# Patient Record
Sex: Female | Born: 1975
Health system: Southern US, Community
[De-identification: ages and names within clinical notes are randomized; demographics above are authoritative.]

## PROBLEM LIST (undated history)

## (undated) DIAGNOSIS — R7303 Prediabetes: Secondary | ICD-10-CM

## (undated) DIAGNOSIS — G4730 Sleep apnea, unspecified: Secondary | ICD-10-CM

## (undated) DIAGNOSIS — F22 Delusional disorders: Secondary | ICD-10-CM

## (undated) DIAGNOSIS — E282 Polycystic ovarian syndrome: Secondary | ICD-10-CM

## (undated) DIAGNOSIS — E785 Hyperlipidemia, unspecified: Secondary | ICD-10-CM

## (undated) DIAGNOSIS — I10 Essential (primary) hypertension: Secondary | ICD-10-CM

## (undated) DIAGNOSIS — N3941 Urge incontinence: Secondary | ICD-10-CM

## (undated) DIAGNOSIS — J45909 Unspecified asthma, uncomplicated: Secondary | ICD-10-CM

## (undated) DIAGNOSIS — F32A Depression, unspecified: Secondary | ICD-10-CM

## (undated) DIAGNOSIS — F329 Major depressive disorder, single episode, unspecified: Secondary | ICD-10-CM

## (undated) DIAGNOSIS — F410 Panic disorder [episodic paroxysmal anxiety] without agoraphobia: Secondary | ICD-10-CM

## (undated) DIAGNOSIS — K219 Gastro-esophageal reflux disease without esophagitis: Secondary | ICD-10-CM

## (undated) DIAGNOSIS — E669 Obesity, unspecified: Secondary | ICD-10-CM

## (undated) DIAGNOSIS — T7840XA Allergy, unspecified, initial encounter: Secondary | ICD-10-CM

## (undated) DIAGNOSIS — D649 Anemia, unspecified: Secondary | ICD-10-CM

## (undated) DIAGNOSIS — M1990 Unspecified osteoarthritis, unspecified site: Secondary | ICD-10-CM

## (undated) DIAGNOSIS — E119 Type 2 diabetes mellitus without complications: Secondary | ICD-10-CM

## (undated) HISTORY — DX: Gastro-esophageal reflux disease without esophagitis: K21.9

## (undated) HISTORY — PX: HYSTEROSCOPY DIAGNOSTIC: PRO49

## (undated) HISTORY — DX: Major depressive disorder, single episode, unspecified: F32.9

## (undated) HISTORY — DX: Panic disorder (episodic paroxysmal anxiety): F41.0

## (undated) HISTORY — DX: Unspecified osteoarthritis, unspecified site: M19.90

## (undated) HISTORY — DX: Sleep apnea, unspecified: G47.30

## (undated) HISTORY — DX: Allergy, unspecified, initial encounter: T78.40XA

## (undated) HISTORY — DX: Polycystic ovarian syndrome: E28.2

## (undated) HISTORY — DX: Anemia, unspecified: D64.9

## (undated) HISTORY — DX: Type 2 diabetes mellitus without complications: E11.9

## (undated) HISTORY — DX: Unspecified asthma, uncomplicated: J45.909

## (undated) HISTORY — DX: Obesity, unspecified: E66.9

## (undated) HISTORY — DX: Depression, unspecified: F32.A

## (undated) HISTORY — DX: Delusional disorders: F22

## (undated) HISTORY — PX: OTHER SURGICAL HISTORY: SHX169

## (undated) HISTORY — DX: Urge incontinence: N39.41

## (undated) HISTORY — DX: Prediabetes: R73.03

---

## 1998-12-17 LAB — PULMONARY FUNCTION TEST

## 2001-11-04 ENCOUNTER — Encounter: Payer: Self-pay | Admitting: *Deleted

## 2001-11-04 ENCOUNTER — Emergency Department (HOSPITAL_COMMUNITY): Admission: EM | Admit: 2001-11-04 | Discharge: 2001-11-04 | Payer: Self-pay | Admitting: *Deleted

## 2002-04-11 ENCOUNTER — Emergency Department (HOSPITAL_COMMUNITY): Admission: RE | Admit: 2002-04-11 | Discharge: 2002-04-11 | Payer: Self-pay | Admitting: *Deleted

## 2003-08-27 ENCOUNTER — Ambulatory Visit (HOSPITAL_COMMUNITY): Admission: RE | Admit: 2003-08-27 | Discharge: 2003-08-27 | Payer: Self-pay | Admitting: Family Medicine

## 2004-08-02 ENCOUNTER — Ambulatory Visit: Admission: RE | Admit: 2004-08-02 | Discharge: 2004-08-02 | Payer: Self-pay | Admitting: Family Medicine

## 2004-08-02 ENCOUNTER — Ambulatory Visit: Payer: Self-pay | Admitting: Pulmonary Disease

## 2008-03-17 ENCOUNTER — Emergency Department (HOSPITAL_COMMUNITY): Admission: EM | Admit: 2008-03-17 | Discharge: 2008-03-17 | Payer: Self-pay | Admitting: Emergency Medicine

## 2008-08-15 ENCOUNTER — Other Ambulatory Visit: Admission: RE | Admit: 2008-08-15 | Discharge: 2008-08-15 | Payer: Self-pay | Admitting: Obstetrics and Gynecology

## 2008-10-29 ENCOUNTER — Ambulatory Visit (HOSPITAL_COMMUNITY): Payer: Self-pay | Admitting: Psychiatry

## 2009-10-09 ENCOUNTER — Emergency Department (HOSPITAL_COMMUNITY): Admission: EM | Admit: 2009-10-09 | Discharge: 2009-10-09 | Payer: Self-pay | Admitting: Emergency Medicine

## 2010-03-31 ENCOUNTER — Other Ambulatory Visit: Admission: RE | Admit: 2010-03-31 | Discharge: 2010-03-31 | Payer: Self-pay | Admitting: Obstetrics and Gynecology

## 2010-07-24 ENCOUNTER — Emergency Department (HOSPITAL_COMMUNITY): Admission: EM | Admit: 2010-07-24 | Discharge: 2010-07-24 | Payer: Self-pay | Admitting: Emergency Medicine

## 2010-12-01 LAB — POCT CARDIAC MARKERS
CKMB, poc: <1 ng/mL — ABNORMAL LOW (ref 1.0–8.0)
Myoglobin, poc: 57 ng/mL (ref 12–200)
Troponin i, poc: <0.05 ng/mL (ref 0.00–0.09)

## 2011-01-30 NOTE — Procedures (Signed)
NAMELuara Castro, Jasmin Castro                ACCOUNT NO.:  [id number]   MEDICAL RECORD NO.:  [id number]          PATIENT TYPE:  OUT   LOCATION:  SLEEP LAB                     FACILITY:  [facility]   PHYSICIAN:  Marcelyn Bruins, M.D. [facility] DATE OF BIRTH:  Mar 01, 1976   DATE OF STUDY:  08/02/2004                              NOCTURNAL POLYSOMNOGRAM   REFERRING PHYSICIAN:  Lubertha South, MD   INDICATION FOR STUDY:  Hypersomnia with sleep apnea.   SLEEP ARCHITECTURE:  The patient had a total sleep time of 342 minutes with  a sleep efficiency of only 82%. There was adequate slow wave sleep but  greatly decreased REM. Sleep onset latency was prolonged at 53 minutes and  REM latency was prolonged as well.   IMPRESSION:  1.  Mild obstructive sleep apnea/hypopnea syndrome with a respiratory      disturbance index of 15 events per hour and O2 desaturations as low as      84%.  The patient did not meet split night protocol secondary to      inadequate numbers of obstructive events in the first 3 hours of sleep.      Events were not positional nor were they primarily REM related.  2.  Moderate-to-loud snoring noted throughout the study.  3.  No clinically significant cardiac arrhythmias.      KC/MEDQ  D:  08/26/2004 15:55:12  T:  08/26/2004 22:33:13  Job:  [id number]

## 2011-06-25 ENCOUNTER — Other Ambulatory Visit (HOSPITAL_COMMUNITY): Payer: Self-pay | Admitting: Urology

## 2011-06-25 DIAGNOSIS — R109 Unspecified abdominal pain: Secondary | ICD-10-CM

## 2011-06-25 DIAGNOSIS — N390 Urinary tract infection, site not specified: Secondary | ICD-10-CM

## 2011-06-29 ENCOUNTER — Ambulatory Visit (HOSPITAL_COMMUNITY)
Admission: RE | Admit: 2011-06-29 | Discharge: 2011-06-29 | Disposition: A | Discharge status: Home / Self Care | Payer: Medicare Other | Source: Ambulatory Visit | Attending: Urology | Admitting: Urology

## 2011-06-29 DIAGNOSIS — R109 Unspecified abdominal pain: Secondary | ICD-10-CM

## 2011-06-29 DIAGNOSIS — N390 Urinary tract infection, site not specified: Secondary | ICD-10-CM | POA: Insufficient documentation

## 2011-06-29 DIAGNOSIS — R1032 Left lower quadrant pain: Secondary | ICD-10-CM | POA: Insufficient documentation

## 2011-08-13 ENCOUNTER — Other Ambulatory Visit: Payer: Self-pay | Admitting: Family Medicine

## 2011-08-13 ENCOUNTER — Ambulatory Visit (HOSPITAL_COMMUNITY)
Admission: RE | Admit: 2011-08-13 | Discharge: 2011-08-13 | Disposition: A | Discharge status: Home / Self Care | Payer: Medicare Other | Source: Ambulatory Visit | Attending: Family Medicine | Admitting: Family Medicine

## 2011-08-13 DIAGNOSIS — M25569 Pain in unspecified knee: Secondary | ICD-10-CM | POA: Insufficient documentation

## 2011-08-13 DIAGNOSIS — M25562 Pain in left knee: Secondary | ICD-10-CM

## 2011-10-26 DIAGNOSIS — N390 Urinary tract infection, site not specified: Secondary | ICD-10-CM | POA: Diagnosis not present

## 2011-12-02 DIAGNOSIS — M545 Low back pain: Secondary | ICD-10-CM | POA: Diagnosis not present

## 2011-12-26 ENCOUNTER — Inpatient Hospital Stay (HOSPITAL_COMMUNITY): Admission: RE | Admit: 2011-12-26 | Payer: Self-pay

## 2011-12-26 ENCOUNTER — Ambulatory Visit (HOSPITAL_COMMUNITY)
Admission: RE | Admit: 2011-12-26 | Discharge: 2011-12-26 | Disposition: A | Discharge status: Home / Self Care | Payer: Medicare Other | Source: Ambulatory Visit | Attending: Family Medicine | Admitting: Family Medicine

## 2011-12-26 ENCOUNTER — Other Ambulatory Visit: Payer: Self-pay | Admitting: Family Medicine

## 2011-12-26 DIAGNOSIS — M545 Low back pain, unspecified: Secondary | ICD-10-CM | POA: Diagnosis not present

## 2011-12-26 DIAGNOSIS — M47817 Spondylosis without myelopathy or radiculopathy, lumbosacral region: Secondary | ICD-10-CM | POA: Diagnosis not present

## 2011-12-26 DIAGNOSIS — R079 Chest pain, unspecified: Secondary | ICD-10-CM | POA: Insufficient documentation

## 2011-12-26 DIAGNOSIS — R300 Dysuria: Secondary | ICD-10-CM | POA: Diagnosis not present

## 2011-12-26 DIAGNOSIS — I10 Essential (primary) hypertension: Secondary | ICD-10-CM | POA: Diagnosis not present

## 2011-12-26 DIAGNOSIS — N390 Urinary tract infection, site not specified: Secondary | ICD-10-CM | POA: Diagnosis not present

## 2011-12-26 DIAGNOSIS — R1084 Generalized abdominal pain: Secondary | ICD-10-CM | POA: Diagnosis not present

## 2012-01-13 ENCOUNTER — Emergency Department (HOSPITAL_COMMUNITY)
Admission: EM | Admit: 2012-01-13 | Discharge: 2012-01-13 | Disposition: A | Discharge status: Home / Self Care | Payer: Medicare Other | Attending: Emergency Medicine | Admitting: Emergency Medicine

## 2012-01-13 ENCOUNTER — Encounter (HOSPITAL_COMMUNITY): Payer: Self-pay | Admitting: *Deleted

## 2012-01-13 DIAGNOSIS — S335XXA Sprain of ligaments of lumbar spine, initial encounter: Secondary | ICD-10-CM | POA: Insufficient documentation

## 2012-01-13 DIAGNOSIS — M549 Dorsalgia, unspecified: Secondary | ICD-10-CM | POA: Insufficient documentation

## 2012-01-13 DIAGNOSIS — E785 Hyperlipidemia, unspecified: Secondary | ICD-10-CM | POA: Diagnosis not present

## 2012-01-13 DIAGNOSIS — I10 Essential (primary) hypertension: Secondary | ICD-10-CM | POA: Insufficient documentation

## 2012-01-13 DIAGNOSIS — X58XXXA Exposure to other specified factors, initial encounter: Secondary | ICD-10-CM | POA: Insufficient documentation

## 2012-01-13 DIAGNOSIS — S39012A Strain of muscle, fascia and tendon of lower back, initial encounter: Secondary | ICD-10-CM

## 2012-01-13 HISTORY — DX: Hyperlipidemia, unspecified: E78.5

## 2012-01-13 HISTORY — DX: Essential (primary) hypertension: I10

## 2012-01-13 LAB — URINALYSIS, ROUTINE W REFLEX MICROSCOPIC
Hgb urine dipstick: NEGATIVE
Leukocytes, UA: NEGATIVE
Nitrite: NEGATIVE
Protein, ur: NEGATIVE mg/dL
Specific Gravity, Urine: <1.005 — ABNORMAL LOW (ref 1.005–1.030)
Urobilinogen, UA: 0.2 mg/dL (ref 0.0–1.0)

## 2012-01-13 LAB — PREGNANCY, URINE: Preg Test, Ur: NEGATIVE

## 2012-01-13 MED ORDER — HYDROCODONE-ACETAMINOPHEN 5-325 MG PO TABS: 1.0000 | ORAL_TABLET | ORAL | Status: AC | PRN

## 2012-01-13 NOTE — Discharge Instructions (Signed)
Back Pain, Adult Low back pain is very common. About 1 in 5 people have back pain.The cause of low back pain is rarely dangerous. The pain often gets better over time.About half of people with a sudden onset of back pain feel better in just 2 weeks. About 8 in 10 people feel better by 6 weeks.  CAUSES Some common causes of back pain include:  Strain of the muscles or ligaments supporting the spine.   Wear and tear (degeneration) of the spinal discs.   Arthritis.   Direct injury to the back.  DIAGNOSIS Most of the time, the direct cause of low back pain is not known.However, back pain can be treated effectively even when the exact cause of the pain is unknown.Answering your caregiver's questions about your overall health and symptoms is one of the most accurate ways to make sure the cause of your pain is not dangerous. If your caregiver needs more information, he or she may order lab work or imaging tests (X-rays or MRIs).However, even if imaging tests show changes in your back, this usually does not require surgery. HOME CARE INSTRUCTIONS For many people, back pain returns.Since low back pain is rarely dangerous, it is often a condition that people can learn to manageon their own.   Remain active. It is stressful on the back to sit or stand in one place. Do not sit, drive, or stand in one place for more than 30 minutes at a time. Take short walks on level surfaces as soon as pain allows.Try to increase the length of time you walk each day.   Do not stay in bed.Resting more than 1 or 2 days can delay your recovery.   Do not avoid exercise or work.Your body is made to move.It is not dangerous to be active, even though your back may hurt.Your back will likely heal faster if you return to being active before your pain is gone.   Pay attention to your body when you bend and lift. Many people have less discomfortwhen lifting if they bend their knees, keep the load close to their  bodies,and avoid twisting. Often, the most comfortable positions are those that put less stress on your recovering back.   Find a comfortable position to sleep. Use a firm mattress and lie on your side with your knees slightly bent. If you lie on your back, put a pillow under your knees.   Only take over-the-counter or prescription medicines as directed by your caregiver. Over-the-counter medicines to reduce pain and inflammation are often the most helpful.Your caregiver may prescribe muscle relaxant drugs.These medicines help dull your pain so you can more quickly return to your normal activities and healthy exercise.   Put ice on the injured area.   Put ice in a plastic bag.   Place a towel between your skin and the bag.   Leave the ice on for 15 to 20 minutes, 3 to 4 times a day for the first 2 to 3 days. After that, ice and heat may be alternated to reduce pain and spasms.   Ask your caregiver about trying back exercises and gentle massage. This may be of some benefit.   Avoid feeling anxious or stressed.Stress increases muscle tension and can worsen back pain.It is important to recognize when you are anxious or stressed and learn ways to manage it.Exercise is a great option.  SEEK MEDICAL CARE IF:  You have pain that is not relieved with rest or medicine.   You have   pain that does not improve in 1 week.   You have new symptoms.   You are generally not feeling well.  SEEK IMMEDIATE MEDICAL CARE IF:   You have pain that radiates from your back into your legs.   You develop new bowel or bladder control problems.   You have unusual weakness or numbness in your arms or legs.   You develop nausea or vomiting.   You develop abdominal pain.   You feel faint.  Document Released: 08/31/2005 Document Revised: 08/20/2011 Document Reviewed: 01/19/2011 [facility] Patient Information 2012 [place], [place].   Do not drive within 4 hours of taking hydrocodone as this will make you  drowsy.  Avoid lifting,  Bending,  Twisting or any other activity that worsens your pain over the next week.  Apply an  icepack  to your lower back for 10-15 minutes every 2 hours for the next 2 days. Followup with your doctor tomorrow as scheduled.

## 2012-01-13 NOTE — ED Notes (Signed)
Low back pain for 2 mos, that comes around to bil flank.  Seen by MD for same 2 mos ago and had x-rays done. And tx for UTI.

## 2012-01-14 DIAGNOSIS — M545 Low back pain: Secondary | ICD-10-CM | POA: Diagnosis not present

## 2012-01-14 DIAGNOSIS — I10 Essential (primary) hypertension: Secondary | ICD-10-CM | POA: Diagnosis not present

## 2012-01-14 NOTE — ED Provider Notes (Signed)
History     CSN: [id number]  Arrival date & time 01/13/12  1157   First MD Initiated Contact with Patient 01/13/12 1338      Chief Complaint  Patient presents with  . Back Pain    (Consider location/radiation/quality/duration/timing/severity/associated sxs/prior treatment) HPI Comments: Jasmin Castro presents with a 2 month history of intermittent low back pain without injury.  She was seen by her pcp for this 2 months ago at which time she reports xrays of her lower back were normal,  But she had a uti which she was treated for.  She has worse pain today,  So came in to make sure she did not have another uti.  She denies urinary frequency,  Dysuria,  Hematuria or vaginal discharge.  She has low back pain which is achy,  Constant,  But worse with changes in position and walking.  She is scheduled to see her doctor tomorrow,  But "just couldn't wait".  Patient is a 36 y.o. female presenting with back pain. The history is provided by the patient.  Back Pain  The pain is associated with no known injury. The pain is at a severity of 5/10. The pain is moderate. The symptoms are aggravated by bending, twisting and certain positions. Pertinent negatives include no chest pain, no fever, no numbness, no abdominal pain, no bowel incontinence, no perianal numbness, no bladder incontinence, no dysuria, no paresthesias, no paresis and no weakness. She has tried nothing for the symptoms.    Past Medical History  Diagnosis Date  . Hyperlipemia   . Hypertension     Past Surgical History  Procedure Date  . Insulin resistant     History reviewed. No pertinent family history.  History  Substance Use Topics  . Smoking status: Never Smoker   . Smokeless tobacco: Not on file  . Alcohol Use: No    OB History    Grav Para Term Preterm Abortions TAB SAB Ect Mult Living                  Review of Systems  Constitutional: Negative for fever.  Respiratory: Negative for shortness of breath.     Cardiovascular: Negative for chest pain and leg swelling.  Gastrointestinal: Negative for abdominal pain, constipation, abdominal distention and bowel incontinence.  Genitourinary: Negative for bladder incontinence, dysuria, urgency, frequency, flank pain and difficulty urinating.  Musculoskeletal: Positive for back pain. Negative for joint swelling and gait problem.  Skin: Negative for rash.  Neurological: Negative for weakness, numbness and paresthesias.    Allergies  Lodine  Home Medications   Current Outpatient Rx  Name Route Sig Dispense Refill  . HYDROCODONE-ACETAMINOPHEN 5-325 MG PO TABS Oral Take 1 tablet by mouth every 4 (four) hours as needed for pain. 20 tablet 0    BP 145/85  Pulse 87  Temp(Src) 97.9 F (36.6 C) (Oral)  Resp 20  Ht 5\' 5"  (1.651 m)  Wt 364 lb (165.109 kg)  BMI 60.57 kg/m2  SpO2 99%  LMP 01/12/2012  Physical Exam  Nursing note and vitals reviewed. Constitutional: She appears well-developed and well-nourished.  HENT:  Head: Normocephalic.  Eyes: Conjunctivae are normal.  Neck: Normal range of motion. Neck supple.  Cardiovascular: Normal rate and intact distal pulses.        Pedal pulses normal.  Pulmonary/Chest: Effort normal.  Abdominal: Soft. Bowel sounds are normal. She exhibits no distension and no mass.  Musculoskeletal: Normal range of motion. She exhibits no edema.  Lumbar back: She exhibits tenderness. She exhibits no swelling, no edema and no spasm.  Neurological: She is alert. She has normal strength. She displays no atrophy and no tremor. No sensory deficit. Gait normal.  Reflex Scores:      Patellar reflexes are 2+ on the right side and 2+ on the left side.      Achilles reflexes are 2+ on the right side and 2+ on the left side.      No strength deficit noted in hip and knee flexor and extensor muscle groups.  Ankle flexion and extension intact.  Skin: Skin is warm and dry.  Psychiatric: She has a normal mood and affect.     ED Course  Procedures (including critical care time)  Labs Reviewed  URINALYSIS, ROUTINE W REFLEX MICROSCOPIC - Abnormal; Notable for the following:    Specific Gravity, Urine <1.005 (*)    All other components within normal limits  PREGNANCY, URINE  URINE CULTURE   No results found.   1. Lumbar strain       MDM  Urine culture sent as pt states her previous uti was only seen on culture.  Sent for pt reassurance.  Hydrocodone,  Low back pain instructions.  Pt to see pcp tomorrow.  No neuro deficit on exam or by history to suggest emergent or surgical presentation.     Burgess Amor, [place] 01/14/12 [phone]

## 2012-01-14 NOTE — ED Provider Notes (Signed)
Medical screening examination/treatment/procedure(s) were performed by non-physician practitioner and as supervising physician I was immediately available for consultation/collaboration.   Carleene Cooper III, MD 01/14/12 2046

## 2012-01-15 LAB — URINE CULTURE: Colony Count: >=100000

## 2012-01-27 ENCOUNTER — Ambulatory Visit (HOSPITAL_COMMUNITY)
Admission: RE | Admit: 2012-01-27 | Discharge: 2012-01-27 | Disposition: A | Discharge status: Home / Self Care | Payer: Medicare Other | Source: Ambulatory Visit | Attending: Family Medicine | Admitting: Family Medicine

## 2012-01-27 DIAGNOSIS — IMO0001 Reserved for inherently not codable concepts without codable children: Secondary | ICD-10-CM | POA: Insufficient documentation

## 2012-01-27 DIAGNOSIS — M545 Low back pain, unspecified: Secondary | ICD-10-CM | POA: Insufficient documentation

## 2012-01-27 NOTE — Evaluation (Signed)
Physical Therapy Evaluation  Patient Details  Name: Jasmin Castro MRN: [id number] Date of Birth: Jan 23, 1976  Today's Date: 01/27/2012 Time: 4098-1191 PT Time Calculation (min): 44 min  Visit#: 1  of 6   Re-eval: 02/17/12 Assessment Diagnosis: Lumbar strain Next MD Visit: 02/09/2012  Authorization: Medicare G code required  Past Medical History:  Past Medical History  Diagnosis Date  . Hyperlipemia   . Hypertension    Past Surgical History:  Past Surgical History  Procedure Date  . Insulin resistant     Subjective Symptoms/Limitations Symptoms: Jasmin Castro states that she has had increased Low back pain with insidious onset for the past two and a half months.  The pateint states the pain is in the center and equal R and L.  The pain is improving How long can you sit comfortably?: The patient states that sitting is no problem for her. How long can you stand comfortably?: She states that standing is no problem How long can you walk comfortably?: She is not having any difficulty walking. Special Tests: Pushing her mother in her wheelchair aggrevates her condition. Pain Assessment Currently in Pain?: Yes Pain Score:   4 (Worst pain has been a 6/10) Pain Location: Back Pain Orientation: Mid Pain Type: Chronic pain Pain Onset: More than a month ago Pain Frequency: Constant Pain Relieving Factors: anti-inflammatory.   Assessment RLE Strength Right Hip Flexion: 5/5 Right Hip Extension: 3/5 Right Hip ABduction: 5/5 Right Hip ADduction: 5/5 Right Knee Flexion: 4/5 Right Knee Extension: 5/5 Right Ankle Dorsiflexion: 5/5 LLE Strength Left Hip Flexion: 5/5 Left Hip Extension: 3-/5 Left Hip ABduction: 5/5 Left Hip ADduction: 5/5 Left Knee Flexion: 4/5 Left Knee Extension: 5/5 Left Ankle Dorsiflexion: 5/5 Lumbar AROM Lumbar Flexion: wfl Lumbar Extension: wfl Lumbar - Right Side Bend: decreased 20% Lumbar - Left Side Bend: decreased 20% Lumbar - Right Rotation:   (decreased 40%) Lumbar - Left Rotation: decreased 25%  Exercise/Treatments     Stretches Standing Extension: 5 reps   Seated Other Seated Lumbar Exercises: pelvic floor x 5 Other Seated Lumbar Exercises: transverse ab x 5 Supine Glut Set: 5 seconds   Prone  Straight Leg Raise: 5 reps    Physical Therapy Assessment and Plan PT Assessment and Plan Clinical Impression Statement: Pt with chronic back pain who will benefit from skilled PT for postural and body mechanic education as well as stabilization program. Pt will benefit from skilled therapeutic intervention in order to improve on the following deficits: Pain;Obesity;Decreased strength Rehab Potential: Good PT Frequency: Min 2X/week PT Duration:  (3 weeks) PT Treatment/Interventions: Therapeutic exercise;Therapeutic activities;Patient/family education PT Plan: educate in body mechanics and posture.  Begin core strengthening. Begin T-band, bridge, bent knee lift and clam next visit.    Goals Home Exercise Program Pt will Perform Home Exercise Program: Independently PT Short Term Goals Time to Complete Short Term Goals: 3 weeks PT Short Term Goal 1: Pain level to be decreased by 3 levels. PT Short Term Goal 2: Pt to verbalize the importance of proper posture in back care PT Short Term Goal 3: Pt strength to be improved by 1/2 grade.  PT Short Term Goal 4: Pt to states she is having no pain pushing her mothers wheel chair.  Problem List There is no problem list on file for this patient.   PT - End of Session Activity Tolerance: Patient tolerated treatment well General Behavior During Session: [facility] for tasks performed Cognition: [facility] for tasks performed PT Plan of Care PT Home  Exercise Plan: given Consulted and Agree with Plan of Care: Patient  GP  Functional Reporting Modifier  Current Status  G [id number] carrying moving and handling objects CJ - At least 20% but less than 40% impaired, limited or restricted  Goal  Status  [phone] - Carrying, Moving and Handling Objects CH - 0 percent impaired, limited or restricted  I chose Carrying and moving because pt's mother lives with her and is w/c bound.  Whenever pt pushes her mother she has increased pain. Jasmin Castro,CINDY 01/27/2012, 3:18 PM  Physician Documentation Your signature is required to indicate approval of the treatment plan as stated above.  Please sign and either send electronically or make a copy of this report for your files and return this physician signed original.   Please mark one 1.__approve of plan  2. ___approve of plan with the following conditions.   ______________________________                                                          _____________________ Physician Signature                                                                                                             Date

## 2012-02-02 ENCOUNTER — Ambulatory Visit (HOSPITAL_COMMUNITY)
Admission: RE | Admit: 2012-02-02 | Discharge: 2012-02-02 | Disposition: A | Discharge status: Home / Self Care | Payer: Medicare Other | Source: Ambulatory Visit | Attending: Family Medicine | Admitting: Family Medicine

## 2012-02-02 NOTE — Progress Notes (Signed)
Physical Therapy Treatment Patient Details  Name: Jasmin Castro MRN: [id number] Date of Birth: [birth year]  Today's Date: 02/02/2012 Time: 4098-1191 PT Time Calculation (min): 37 min Visit #: 2  of 6   Re-eval: 02/17/12 Charges: Therex x 34'  Authorization: Medicare G code required    Subjective: Symptoms/Limitations Symptoms: Pt is painfree but she is still taking her anit-inflammatory. Pain Assessment Currently in Pain?: No/denies Pain Score: 0-No pain   Exercise/Treatments Standing Functional Squats: 10 reps Scapular Retraction: 10 reps;Theraband Theraband Level (Scapular Retraction): Level 3 (Green) Row: 10 reps;Theraband Theraband Level (Row): Level 3 (Green) Shoulder Extension: 10 reps;Theraband Theraband Level (Shoulder Extension): Level 3 (Green) Seated Other Seated Lumbar Exercises: pelvic floor x 10; transverse ab x 10 Other Seated Lumbar Exercises: coming to correct sitting posture then retraciton x10 Supine Glut Set: 10 reps;5 seconds Bent Knee Raise: 10 reps;Limitations Bent Knee Raise Limitations: w/multimodal cueing for core control Bridge: 10 reps;3 seconds Sidelying Clam: 5 reps;Limitations Clam Limitations: 10" holds Hip Abduction: 10 reps Prone  Straight Leg Raise: 10 reps Other Prone Lumbar Exercises: heel squeeze 10x5"  Physical Therapy Assessment and Plan PT Assessment and Plan Clinical Impression Statement: Tx focus on core stabilization and improving posture. Pt requires multimodal cueing to facilitate core control with bent knee raise. Pt educated on importance of using good form and posture when lifting or bending. HEP given for functional squats, SL hip abd and bridging as pt was able to complete these exercises with minimal need for cueing. Pt is without complaint throughout session and reports 0/10 pain at end of session. PT Plan: Continue to progress postural and core strength per PT POC.     Problem List There is no problem list on  file for this patient.   PT - End of Session Activity Tolerance: Patient tolerated treatment well General Behavior During Session: [facility] for tasks performed Cognition: [facility] for tasks performed   Seth Bake, PTA 02/02/2012, 3:29 PM

## 2012-02-04 ENCOUNTER — Ambulatory Visit (HOSPITAL_COMMUNITY): Payer: Medicare Other | Admitting: Physical Therapy

## 2012-02-09 ENCOUNTER — Ambulatory Visit (HOSPITAL_COMMUNITY): Payer: Medicare Other | Admitting: Physical Therapy

## 2012-02-11 ENCOUNTER — Ambulatory Visit (HOSPITAL_COMMUNITY): Payer: Medicare Other | Admitting: *Deleted

## 2012-02-16 ENCOUNTER — Ambulatory Visit (HOSPITAL_COMMUNITY): Payer: Medicare Other | Admitting: Physical Therapy

## 2012-02-18 ENCOUNTER — Ambulatory Visit (HOSPITAL_COMMUNITY): Payer: Medicare Other | Admitting: Physical Therapy

## 2012-07-18 ENCOUNTER — Ambulatory Visit (INDEPENDENT_AMBULATORY_CARE_PROVIDER_SITE_OTHER): Payer: Medicare Other | Admitting: Family Medicine

## 2012-07-18 ENCOUNTER — Encounter: Payer: Self-pay | Admitting: Family Medicine

## 2012-07-18 VITALS — BP 136/86 | HR 86 | Resp 18 | Ht 65.0 in | Wt 379.1 lb

## 2012-07-18 DIAGNOSIS — I10 Essential (primary) hypertension: Secondary | ICD-10-CM | POA: Diagnosis not present

## 2012-07-18 DIAGNOSIS — F3289 Other specified depressive episodes: Secondary | ICD-10-CM

## 2012-07-18 DIAGNOSIS — R7309 Other abnormal glucose: Secondary | ICD-10-CM

## 2012-07-18 DIAGNOSIS — F329 Major depressive disorder, single episode, unspecified: Secondary | ICD-10-CM

## 2012-07-18 DIAGNOSIS — Z23 Encounter for immunization: Secondary | ICD-10-CM | POA: Diagnosis not present

## 2012-07-18 DIAGNOSIS — E785 Hyperlipidemia, unspecified: Secondary | ICD-10-CM | POA: Insufficient documentation

## 2012-07-18 DIAGNOSIS — E669 Obesity, unspecified: Secondary | ICD-10-CM | POA: Insufficient documentation

## 2012-07-18 DIAGNOSIS — E1169 Type 2 diabetes mellitus with other specified complication: Secondary | ICD-10-CM | POA: Insufficient documentation

## 2012-07-18 DIAGNOSIS — R7303 Prediabetes: Secondary | ICD-10-CM

## 2012-07-18 DIAGNOSIS — F32A Depression, unspecified: Secondary | ICD-10-CM

## 2012-07-18 DIAGNOSIS — F411 Generalized anxiety disorder: Secondary | ICD-10-CM | POA: Insufficient documentation

## 2012-07-18 MED ORDER — ENALAPRIL MALEATE 10 MG PO TABS: 10.0000 mg | ORAL_TABLET | Freq: Every day | ORAL | Status: DC

## 2012-07-18 NOTE — Assessment & Plan Note (Signed)
Check FLP 

## 2012-07-18 NOTE — Assessment & Plan Note (Signed)
Pt to start walking 10-15 minutes a day

## 2012-07-18 NOTE — Assessment & Plan Note (Signed)
Followed by psychiatry, on disability due to mental illness

## 2012-07-18 NOTE — Patient Instructions (Addendum)
Start walking 15 minute a day  Get the labs done fasting- do not eat after midnight- we will call with results  Blood pressure medication refilled I will obtain records  Flu shot given  F/U 3 months

## 2012-07-18 NOTE — Progress Notes (Signed)
  Subjective:    Patient ID: Jasmin Castro, female    DOB: [birth year], 36 y.o.   MRN: [id number]  HPI Patient here to establish care. Previous PCP Dr. leaking. [facility]- Dr. Erasmo Downer [place] Medications and history reviewed She would like flu shot today Needs refill on BP meds On disability for depression and anxiety since her early twenties also suffers with panic attacks History of pre diabetes due for labs  Currently at home with parents, no children She was treated for strep pharyngitis 2 weeks ago at urgent care center   Review of Systems  GEN- denies fatigue, fever, weight loss,weakness, recent illness HEENT- denies eye drainage, change in vision, nasal discharge, CVS- denies chest pain, palpitations RESP- denies SOB, cough, wheeze ABD- denies N/V, change in stools, abd pain GU- denies dysuria, hematuria, dribbling, incontinence MSK- denies joint pain, muscle aches, injury Neuro- denies headache, dizziness, syncope, seizure activity       Objective:   Physical Exam GEN- NAD, alert and oriented x3, morbid obesity HEENT- PERRL, EOMI, non injected sclera, pink conjunctiva, MMM, oropharynx clear Neck- Supple,  CVS- RRR, no murmur RESP-CTAB ABD-NABS,soft,NT,ND EXT- No edema Pulses- Radial, DP- 2+ Psych- flat affect, not overly depressed or anxious appearing       Assessment & Plan:

## 2012-07-18 NOTE — Assessment & Plan Note (Signed)
BP looks good today, recheck meds

## 2012-07-18 NOTE — Assessment & Plan Note (Signed)
Check A1C with fasting labs 

## 2012-07-21 DIAGNOSIS — F4010 Social phobia, unspecified: Secondary | ICD-10-CM | POA: Diagnosis not present

## 2012-07-21 DIAGNOSIS — F3110 Bipolar disorder, current episode manic without psychotic features, unspecified: Secondary | ICD-10-CM | POA: Diagnosis not present

## 2012-07-21 DIAGNOSIS — F429 Obsessive-compulsive disorder, unspecified: Secondary | ICD-10-CM | POA: Diagnosis not present

## 2012-07-21 DIAGNOSIS — F410 Panic disorder [episodic paroxysmal anxiety] without agoraphobia: Secondary | ICD-10-CM | POA: Diagnosis not present

## 2012-07-27 DIAGNOSIS — I10 Essential (primary) hypertension: Secondary | ICD-10-CM | POA: Diagnosis not present

## 2012-07-27 DIAGNOSIS — E785 Hyperlipidemia, unspecified: Secondary | ICD-10-CM | POA: Diagnosis not present

## 2012-07-27 DIAGNOSIS — R7309 Other abnormal glucose: Secondary | ICD-10-CM | POA: Diagnosis not present

## 2012-07-27 LAB — COMPREHENSIVE METABOLIC PANEL
Albumin: 3.9 g/dL (ref 3.5–5.2)
Alkaline Phosphatase: 118 U/L — ABNORMAL HIGH (ref 39–117)
Calcium: 9.6 mg/dL (ref 8.4–10.5)
Chloride: 106 mEq/L (ref 96–112)
Glucose, Bld: 91 mg/dL (ref 70–99)
Potassium: 4.2 mEq/L (ref 3.5–5.3)
Sodium: 140 mEq/L (ref 135–145)
Total Protein: 7.1 g/dL (ref 6.0–8.3)

## 2012-07-27 LAB — TSH: TSH: 1.477 u[IU]/mL (ref 0.350–4.500)

## 2012-07-27 LAB — CBC
MCH: 25.5 pg — ABNORMAL LOW (ref 26.0–34.0)
MCHC: 32.4 g/dL (ref 30.0–36.0)
Platelets: 453 10*3/uL — ABNORMAL HIGH (ref 150–400)
RBC: 4.62 MIL/uL (ref 3.87–5.11)

## 2012-07-27 LAB — LIPID PANEL
Cholesterol: 204 mg/dL — ABNORMAL HIGH (ref 0–200)
Total CHOL/HDL Ratio: 4.6 Ratio

## 2012-07-28 LAB — HEMOGLOBIN A1C: Mean Plasma Glucose: 108 mg/dL (ref <117)

## 2012-08-02 ENCOUNTER — Telehealth: Payer: Self-pay | Admitting: Family Medicine

## 2012-08-03 NOTE — Telephone Encounter (Signed)
Patient aware of labs.  

## 2012-10-17 ENCOUNTER — Encounter: Payer: Self-pay | Admitting: Family Medicine

## 2012-10-17 ENCOUNTER — Ambulatory Visit (INDEPENDENT_AMBULATORY_CARE_PROVIDER_SITE_OTHER): Payer: Medicare Other | Admitting: Family Medicine

## 2012-10-17 VITALS — BP 138/84 | HR 98 | Resp 16 | Ht 65.0 in | Wt 386.1 lb

## 2012-10-17 DIAGNOSIS — F32A Depression, unspecified: Secondary | ICD-10-CM

## 2012-10-17 DIAGNOSIS — J069 Acute upper respiratory infection, unspecified: Secondary | ICD-10-CM | POA: Diagnosis not present

## 2012-10-17 DIAGNOSIS — F3289 Other specified depressive episodes: Secondary | ICD-10-CM | POA: Diagnosis not present

## 2012-10-17 DIAGNOSIS — Z124 Encounter for screening for malignant neoplasm of cervix: Secondary | ICD-10-CM

## 2012-10-17 DIAGNOSIS — E785 Hyperlipidemia, unspecified: Secondary | ICD-10-CM | POA: Diagnosis not present

## 2012-10-17 DIAGNOSIS — F411 Generalized anxiety disorder: Secondary | ICD-10-CM

## 2012-10-17 DIAGNOSIS — I10 Essential (primary) hypertension: Secondary | ICD-10-CM | POA: Diagnosis not present

## 2012-10-17 DIAGNOSIS — F329 Major depressive disorder, single episode, unspecified: Secondary | ICD-10-CM

## 2012-10-17 MED ORDER — ENALAPRIL MALEATE 10 MG PO TABS: 10.0000 mg | ORAL_TABLET | Freq: Every day | ORAL | Status: DC

## 2012-10-17 NOTE — Assessment & Plan Note (Signed)
Continue meds, labs reviewed

## 2012-10-17 NOTE — Assessment & Plan Note (Signed)
Discussed diet, LDL 145, she is at risk for developing early CAD with her morbid obesity and HTN, discussed with pt, she is dedicated to walking and changing diet, will recheck in 3 months, if no improvement start statin drug

## 2012-10-17 NOTE — Assessment & Plan Note (Signed)
Discussed activity and diet, see handout

## 2012-10-17 NOTE — Assessment & Plan Note (Signed)
Supportive care normal exam today she can take Claritin daily for the next week this may help with some of the congested feeling in ear that she has.

## 2012-10-17 NOTE — Assessment & Plan Note (Signed)
Well controlled 

## 2012-10-17 NOTE — Progress Notes (Signed)
  Subjective:    Patient ID: Jasmin Castro, female    DOB: 1976/02/19, 37 y.o.   MRN: [id number]  HPI  The patient presents to follow chronic medical problems. She's complaint of ear pressure bilaterally on and off for the past week she has mild cough which is nonproductive and mild runny nose. She's been using Claritin and Tylenol which has helped she's not taking it daily. She denies any fever or drainage from the ears. She will like to have a new psychiatrist because she is currently commuting to [facility] to see Dr. Omelia Blackwater and this is too far for her. She's been followed every 3-4 months secondary to her depression and anxiety  Review of Systems - per above    GEN- denies fatigue, fever, weight loss,weakness, recent illness HEENT- denies eye drainage, change in vision, +nasal discharge,+ ear pressure  CVS- denies chest pain, palpitations RESP- denies SOB, +cough, wheeze ABD- denies N/V, change in stools, abd pain GU- denies dysuria, hematuria, dribbling, incontinence MSK- denies joint pain, muscle aches, injury Neuro- denies headache, dizziness, syncope, seizure activity      Objective:   Physical Exam  GEN- NAD, alert and oriented x3, obese HEENT- PERRL, EOMI, non injected sclera, pink conjunctiva, MMM, oropharynx clear, TM clear bilat no effusion, no maxillary sinus tenderness, ,nares clear Neck- Supple, no LAD CVS- RRR, no murmur RESP-CTAB EXT- No edema Pulses- Radial 2+ Psych-normal affect and mood         Assessment & Plan:

## 2012-10-17 NOTE — Patient Instructions (Signed)
[facility]  Continue current medications Add Claritin once a day for the next week to help with sinus and ear pressure Work on diet- cholesterol is too high - Fresh fruits and veggies, avoid fried foods, " white foods" pasta, rice, potatoes, french fries, bread  Referral to new psychiatrist  F/U 3 months, get the labs done 1 week before next visit

## 2012-10-17 NOTE — Assessment & Plan Note (Signed)
Will defer to a new psychiatrist. If we're unable to find one in [place] and she will follow [facility] as her brother is a patient [facility] and she does not want to go to the same psychiatrist as him

## 2012-10-21 ENCOUNTER — Telehealth: Payer: Self-pay | Admitting: Family Medicine

## 2012-10-21 NOTE — Telephone Encounter (Signed)
Patient needs to call [facility]

## 2012-11-14 ENCOUNTER — Ambulatory Visit (INDEPENDENT_AMBULATORY_CARE_PROVIDER_SITE_OTHER): Payer: Medicare Other | Admitting: Family Medicine

## 2012-11-14 ENCOUNTER — Encounter: Payer: Self-pay | Admitting: Family Medicine

## 2012-11-14 VITALS — BP 132/74 | HR 100 | Resp 18 | Ht 65.0 in | Wt 381.0 lb

## 2012-11-14 DIAGNOSIS — M7661 Achilles tendinitis, right leg: Secondary | ICD-10-CM

## 2012-11-14 DIAGNOSIS — M7660 Achilles tendinitis, unspecified leg: Secondary | ICD-10-CM

## 2012-11-14 MED ORDER — NAPROXEN 500 MG PO TABS: 500.0000 mg | ORAL_TABLET | Freq: Two times a day (BID) | ORAL | Status: AC

## 2012-11-14 NOTE — Progress Notes (Signed)
  Subjective:    Patient ID: Jasmin Castro, female    DOB: 30-Apr-1976, 37 y.o.   MRN: [id number]  HPI  Pt here with right ankle pain x 2 weeks, no specific injury, soreness and  Burning near lateral ankle and sorness and burning at the heel. Pain with walking, used absorbent junior which has helped   Review of Systems  GEN- denies fatigue, fever, weight loss,weakness, recent illness HEENT- denies eye drainage, change in vision, nasal discharge, CVS- denies chest pain, palpitations MSK- + joint pain, muscle aches, injury Neuro- denies headache, dizziness, syncope, seizure activity      Objective:   Physical Exam  GEN-NAD,alert and oriented x 3  MSK- Right ankle normal inspection, no swelling, Mild TTP near insertion of achilles, achilles in tact, and tenderness 1 cm in lateral to achilles, ligaments in tact, normal ROM ankle  Right knee- normal ROM, no swelling, ligaments in tact        Assessment & Plan:

## 2012-11-14 NOTE — Assessment & Plan Note (Signed)
Gery Pray mild inflammation I see no swelling and no bruising to the skin. Overall her range of motion is good and she is ambulatory. I will put her on anti-inflammatories will also use ice I do not think x-rays needed at this time.

## 2012-11-14 NOTE — Patient Instructions (Signed)
You have inflammation around tendon Take the naprosyn twice a day Use ICE for any swelling You can use an ACE wrap if walking a lot  Call if you do not improve in the next 2-3 weeks

## 2012-12-05 DIAGNOSIS — M1710 Unilateral primary osteoarthritis, unspecified knee: Secondary | ICD-10-CM | POA: Diagnosis not present

## 2012-12-05 DIAGNOSIS — M25569 Pain in unspecified knee: Secondary | ICD-10-CM | POA: Diagnosis not present

## 2012-12-05 DIAGNOSIS — S93409A Sprain of unspecified ligament of unspecified ankle, initial encounter: Secondary | ICD-10-CM | POA: Diagnosis not present

## 2012-12-22 DIAGNOSIS — F4010 Social phobia, unspecified: Secondary | ICD-10-CM | POA: Diagnosis not present

## 2013-01-10 DIAGNOSIS — I10 Essential (primary) hypertension: Secondary | ICD-10-CM | POA: Diagnosis not present

## 2013-01-10 DIAGNOSIS — E785 Hyperlipidemia, unspecified: Secondary | ICD-10-CM | POA: Diagnosis not present

## 2013-01-10 LAB — BASIC METABOLIC PANEL
CO2: 28 mEq/L (ref 19–32)
Glucose, Bld: 94 mg/dL (ref 70–99)
Potassium: 4 mEq/L (ref 3.5–5.3)
Sodium: 141 mEq/L (ref 135–145)

## 2013-01-10 LAB — LIPID PANEL
LDL Cholesterol: 139 mg/dL — ABNORMAL HIGH (ref 0–99)
VLDL: 11 mg/dL (ref 0–40)

## 2013-01-10 LAB — CBC
Hemoglobin: 11.7 g/dL — ABNORMAL LOW (ref 12.0–15.0)
RBC: 4.33 MIL/uL (ref 3.87–5.11)

## 2013-01-20 ENCOUNTER — Ambulatory Visit (INDEPENDENT_AMBULATORY_CARE_PROVIDER_SITE_OTHER): Payer: Medicare Other | Admitting: Family Medicine

## 2013-01-20 ENCOUNTER — Encounter: Payer: Self-pay | Admitting: Family Medicine

## 2013-01-20 VITALS — BP 136/80 | HR 80 | Resp 18 | Ht 65.0 in | Wt 388.1 lb

## 2013-01-20 DIAGNOSIS — M1710 Unilateral primary osteoarthritis, unspecified knee: Secondary | ICD-10-CM | POA: Diagnosis not present

## 2013-01-20 DIAGNOSIS — K219 Gastro-esophageal reflux disease without esophagitis: Secondary | ICD-10-CM

## 2013-01-20 DIAGNOSIS — IMO0002 Reserved for concepts with insufficient information to code with codable children: Secondary | ICD-10-CM

## 2013-01-20 DIAGNOSIS — R42 Dizziness and giddiness: Secondary | ICD-10-CM

## 2013-01-20 DIAGNOSIS — E785 Hyperlipidemia, unspecified: Secondary | ICD-10-CM

## 2013-01-20 DIAGNOSIS — I10 Essential (primary) hypertension: Secondary | ICD-10-CM

## 2013-01-20 DIAGNOSIS — M179 Osteoarthritis of knee, unspecified: Secondary | ICD-10-CM

## 2013-01-20 MED ORDER — SIMVASTATIN 10 MG PO TABS: 10.0000 mg | ORAL_TABLET | Freq: Every evening | ORAL | Status: DC

## 2013-01-20 MED ORDER — PANTOPRAZOLE SODIUM 40 MG PO TBEC: 40.0000 mg | DELAYED_RELEASE_TABLET | Freq: Every day | ORAL | Status: DC

## 2013-01-20 MED ORDER — ENALAPRIL MALEATE 10 MG PO TABS: 10.0000 mg | ORAL_TABLET | Freq: Every day | ORAL | Status: DC

## 2013-01-20 MED ORDER — MECLIZINE HCL 12.5 MG PO TABS: 12.5000 mg | ORAL_TABLET | Freq: Three times a day (TID) | ORAL | Status: DC | PRN

## 2013-01-20 NOTE — Patient Instructions (Addendum)
I will order a cane Start new cholesterol medication at bedtime  Continue blood pressure medication Nutrition referral to [facility] Meclizine for vertigo, if no improvement call and referral to ENT Protonix for acid reflux Goal weight loss of 20 lbs F/U 3 months

## 2013-01-22 DIAGNOSIS — M1710 Unilateral primary osteoarthritis, unspecified knee: Secondary | ICD-10-CM | POA: Insufficient documentation

## 2013-01-22 DIAGNOSIS — R42 Dizziness and giddiness: Secondary | ICD-10-CM | POA: Insufficient documentation

## 2013-01-22 DIAGNOSIS — M179 Osteoarthritis of knee, unspecified: Secondary | ICD-10-CM | POA: Insufficient documentation

## 2013-01-22 DIAGNOSIS — K219 Gastro-esophageal reflux disease without esophagitis: Secondary | ICD-10-CM | POA: Insufficient documentation

## 2013-01-22 NOTE — Progress Notes (Signed)
  Subjective:    Patient ID: Jasmin Castro, female    DOB: 27-Aug-1976, 37 y.o.   MRN: [id number]  HPI  Pt here with a few concerns. Seen by ortho for ongoing knee pain and foot pain, has OA in both knees, advised to get a cane to help with ambulation cortisone shot performed Vertigo on and off past 8 years, feels off balance, meclizine helps, sometimes unable to drive due to this Fasting labs reviewed Gets acid reflux on and off, used prescription zantac would like to try protonix, her mother has this  Review of Systems   GEN- denies fatigue, fever, weight loss,weakness, recent illness HEENT- denies eye drainage, change in vision, nasal discharge, CVS- denies chest pain, palpitations RESP- denies SOB, cough, wheeze ABD- denies N/V, change in stools, abd pain GU- denies dysuria, hematuria, dribbling, incontinence MSK- + joint pain, muscle aches, injury Neuro- denies headache, dizziness, syncope, seizure activity      Objective:   Physical Exam GEN- NAD, alert and oriented x3, obese HEENT- PERRL, EOMI, non injected sclera, pink conjunctiva, MMM, oropharynx clear, TM clear bilat no effusion, nares clear Neck- Supple,  CVS- RRR, no murmur RESP-CTAB EXT- No edema NEURO-CNii-XII in tact, no focal deficits, no nystagmus Pulses- Radial, DP- 2+        Assessment & Plan:

## 2013-01-22 NOTE — Assessment & Plan Note (Signed)
Reviewed ortho note, script written for cane Needs substantial weight loss

## 2013-01-22 NOTE — Assessment & Plan Note (Signed)
Morbidly obese, continues to gain weight Set up to see nutritionist at [facility] We will see how this goes, she may be a good candidate for bariatric surgery

## 2013-01-22 NOTE — Assessment & Plan Note (Signed)
Well controlled 

## 2013-01-22 NOTE — Assessment & Plan Note (Signed)
Trial of protonix, if not approved, will need to try omeprazole,pt understands this

## 2013-01-22 NOTE — Assessment & Plan Note (Signed)
Start zocor 10mg 

## 2013-01-22 NOTE — Assessment & Plan Note (Signed)
Chronic problem, meclizine does help, if this does improve symptoms refer to ENT

## 2013-01-23 ENCOUNTER — Telehealth (HOSPITAL_COMMUNITY): Payer: Self-pay | Admitting: Dietician

## 2013-01-23 NOTE — Telephone Encounter (Signed)
Called pt number at [phone]. Voice message reports that pt is not accepting calls at this time. Sent letter to pt home via [place] [employer] in attempt to contact pt to schedule appointment.

## 2013-01-23 NOTE — Telephone Encounter (Signed)
Received referral via fax from Dr. Jeanice Lim [facility]) for dx: obesity.

## 2013-01-30 NOTE — Telephone Encounter (Signed)
Pt has not responded to previous contact attempt. Final attempt; Sent letter to pt home via [place] [employer] in attempt to contact pt to schedule appointment.

## 2013-02-01 NOTE — Telephone Encounter (Signed)
Received multiple voicemails from pt left at [phone], 0848, [phone], and [phone]. Called back at 1015. Appointment scheduled for 02/03/13 at 1400.

## 2013-02-03 ENCOUNTER — Encounter (HOSPITAL_COMMUNITY): Payer: Self-pay | Admitting: Dietician

## 2013-02-03 NOTE — Progress Notes (Signed)
Outpatient Initial Nutrition Assessment  Date:02/03/2013   Appt Start Time: 1353  Referring Physician: Dr. Jeanice Lim Reason for Visit: obesity, prediabetes  Nutrition Assessment:  Height: 5\' 5"  (165.1 cm)   Weight: 385 lb 9.6 oz (174.907 kg)   IBW: 125# %IBW: 308% UBW: 385# %UBW: 100% Body mass index is 64.17 kg/(m^2). Classified as extreme obesity, class III. Goal Weight: 346# (10% of current weight) Weight hx: Pt reports UBW of 385#. Her highest weight was 418# 3-4 years ago,. Her lowest weight was in the 350's in her early 42's.   Estimated nutritional needs: 2400-2500 kcals daily, 140-175 grams protein daily, 12.4-2.5 L fluid daily  PMH:  Past Medical History  Diagnosis Date  . Hyperlipemia   . Hypertension   . Depression   . Asthma   . Prediabetes     Medications:  Current Outpatient Rx  Name  Route  Sig  Dispense  Refill  . acetaminophen (TYLENOL) 325 MG tablet   Oral   Take 650 mg by mouth every 6 (six) hours as needed.         . clonazePAM (KLONOPIN) 0.5 MG tablet   Oral   Take 0.5 mg by mouth daily as needed.         . enalapril (VASOTEC) 10 MG tablet   Oral   Take 1 tablet (10 mg total) by mouth daily.   30 tablet   6   . meclizine (ANTIVERT) 12.5 MG tablet   Oral   Take 1 tablet (12.5 mg total) by mouth 3 (three) times daily as needed for dizziness or nausea.   30 tablet   1   . naproxen (NAPROSYN) 500 MG tablet   Oral   Take 1 tablet (500 mg total) by mouth 2 (two) times daily with a meal.   60 tablet   0   . pantoprazole (PROTONIX) 40 MG tablet   Oral   Take 1 tablet (40 mg total) by mouth daily.   30 tablet   3   . PRISTIQ 100 MG 24 hr tablet               . QUEtiapine Fumarate (SEROQUEL XR) 150 MG 24 hr tablet   Oral   Take 150 mg by mouth at bedtime.         . simvastatin (ZOCOR) 10 MG tablet   Oral   Take 1 tablet (10 mg total) by mouth every evening. For cholesterol   30 tablet   6   . trihexyphenidyl (ARTANE) 2 MG  tablet   Oral   Take 2 mg by mouth at bedtime.           Labs: CMP     Component Value Date/Time   NA 141 01/10/2013 1205   K 4.0 01/10/2013 1205   CL 107 01/10/2013 1205   CO2 28 01/10/2013 1205   GLUCOSE 94 01/10/2013 1205   BUN 5* 01/10/2013 1205   CREATININE 0.63 01/10/2013 1205   CALCIUM 9.0 01/10/2013 1205   PROT 7.1 07/27/2012 0913   ALBUMIN 3.9 07/27/2012 0913   AST 12 07/27/2012 0913   ALT 11 07/27/2012 0913   ALKPHOS 118* 07/27/2012 0913   BILITOT 0.5 07/27/2012 0913    Lipid Panel     Component Value Date/Time   CHOL 199 01/10/2013 1205   TRIG 55 01/10/2013 1205   HDL 49 01/10/2013 1205   CHOLHDL 4.1 01/10/2013 1205   VLDL 11 01/10/2013 1205   LDLCALC 139* 01/10/2013 1205  Lab Results  Component Value Date   HGBA1C 5.4 07/27/2012   Lab Results  Component Value Date   LDLCALC 139* 01/10/2013   CREATININE 0.63 01/10/2013     Lifestyle/ social habits: Ms. Losada lives in [place], [place] with her brother, and her parents. She has been on disability since her 20's, due to depression and anxiety. She reports a stress level of 8/10, citing "family drama" as her main cause of stress. She reveals that she is the primary caregiver for her parents, who have multiple medical issues (her father has Alzheimer's and her mother has MS), and this is very stressful for her. She does not participate in physical activity; she reports knee problems in both knees and well as heel spurs in her right foot make it difficult for her to exercise; she has received cortizone shots in both knees. She also reports chronic back pain, which she received physical therapy for 2 years ago, which "made the pain worse".She reports working out to exercise videos in the very distant past. She does not have access to a gym or exercise equipment.   Nutrition hx/habits: Ms. Dickison reports no changes to her diet. She reports she started using splenda about 10-12 years ago. She reports eating out frequently, sometimes  as often as daily. She reports "I know I should cook more, but it is hard when I have to take my parents to their appointments and run errands". She also reports to drinking excessive amounts of regular soda. She reports that she will try diet soda for sometime, but switches back to regular because she "craves sugar". When she does cook at home, meats are typically fried.  She reports that she has tried to lose weight in the past. Attempts have included "cutting out sugar" in her diet, where she successfully lost 68# (from 418#-350#) about 6 years ago; following a mediterranean diet about 3 months ago, which did not work for her ("I think I messed up my eating schedule"); and losing "a lot" of weight using Dexatrim in her 20's. She reports she has struggled with obesity for most of her life and all of her family members struggle with obesity as well, expect for one of her sisters.   Diet recall: Breakfast (8-10 AM): rice (boiled and country crock and splenda added), 1 piece of whole wheat toast and butter, 20 oz sprite; Lunch (3 PM): fast food cheeseburger with mayonnaise, french fries, Sprite; Dinner (6 PM): fried chicken, potato wedges, swiss rolls, nutty bars, regular soda.    Nutrition Diagnosis: Excessive energy intake r/t diet high in refined carbohydrates, physical inactivity AEB BMI: 64.17.  Nutrition Intervention: Nutrition rx: 2000 kcal NAS, no added sugar diet; 3 meals per day; no snacks; low calorie beverages only; physical activity as tolerated  Education/Counseling Provided: Educated pt on principles of weight management. Discussed principles of energy expenditure and how changes in diet and physical activity affect weight status. Reviewed labs and wt hx; discussed pt at high risk for development of chronic disease due to obesity, family hx, and poor lifestyle habits. Discussed nutritional content of commonly eaten foods and suggested healthier alternatives. Educated pt on plate method and a  general, healthful diet that includes low fat dairy, lean meats, whole fruits and vegetables, and whole grains most often. Discussed importance of setting a consistent eating pattern to achieve glycemic and weight goals. Discussed importance of a healthy diet along with regular physical activity (at least 30 minutes 5 times per week) to achieve weight  loss goals. Provided suggestions for appropriate and encouraged pt to start slow and work towards goals. Encouraged slow, moderate weight loss (0.5-2# weight loss per week) and adopting healthy lifestyle changes vs. obtaining a certain body type or weight. Encouraged weighing self weekly at a consistent day and time of choice. Showed pt functionality of MyFitnessPal and encouraged using a food diary to better track caloric intake. Provided "Go With The Whole Grains" and "Weight Loss Tips" handouts. Used TeachBack to assess understanding.   Understanding, Motivation, Ability to Follow Recommendations: Expect fair compliance.   Monitoring and Evaluation: Goals: 1) 1-2# weight loss per week; 2) 30 minutes physical activity daily  Recommendations: 1) For weight loss: 2000 kcals daily; 2) Limit fast food to 1 time per week; 3) Substitute low calorie drink mixes, diet sodas, and water for soda and other high calorie beverages; 4) Break up physical activity into smaller, more frequent sessions; start slow and work up to goal of 30 minutes daily; 5) Keep food diary (ex. My FitnessPal)  F/U: PRN. Provided RD contact information.   Melody Haver, RD, LDN 02/03/2013  Appt EndTime: [phone]

## 2013-02-07 ENCOUNTER — Telehealth (HOSPITAL_COMMUNITY): Payer: Self-pay | Admitting: Dietician

## 2013-02-07 NOTE — Telephone Encounter (Signed)
Called pt cell phone at [phone]- message reports pt is not accepting calls. Left voicemail at home phone at [phone] to schedule follow-up appointment.

## 2013-02-09 NOTE — Telephone Encounter (Signed)
No return call from pt. Will keep referral on file.

## 2013-02-13 DIAGNOSIS — R350 Frequency of micturition: Secondary | ICD-10-CM | POA: Diagnosis not present

## 2013-02-20 DIAGNOSIS — N35919 Unspecified urethral stricture, male, unspecified site: Secondary | ICD-10-CM | POA: Diagnosis not present

## 2013-02-27 ENCOUNTER — Telehealth: Payer: Self-pay | Admitting: Family Medicine

## 2013-02-27 NOTE — Telephone Encounter (Signed)
She can come in for a visit to have urine sample repeated first, before going to urology or GYN If she still has symptoms schedule visit If no symptoms needs lab appt for UA with reflex

## 2013-02-27 NOTE — Telephone Encounter (Signed)
Waiting on pt to call back spoke to mother and will have her to call me in the morming

## 2013-02-28 ENCOUNTER — Ambulatory Visit: Payer: Self-pay | Admitting: Family Medicine

## 2013-02-28 NOTE — Telephone Encounter (Signed)
noted 

## 2013-02-28 NOTE — Telephone Encounter (Signed)
Pt is going to try taking her medication and then will come in if no better

## 2013-03-06 ENCOUNTER — Ambulatory Visit (INDEPENDENT_AMBULATORY_CARE_PROVIDER_SITE_OTHER): Payer: Medicare Other | Admitting: Family Medicine

## 2013-03-06 ENCOUNTER — Encounter: Payer: Self-pay | Admitting: Family Medicine

## 2013-03-06 VITALS — BP 110/70 | HR 68 | Temp 97.6°F | Resp 16 | Ht 65.0 in | Wt 386.0 lb

## 2013-03-06 DIAGNOSIS — E785 Hyperlipidemia, unspecified: Secondary | ICD-10-CM

## 2013-03-06 DIAGNOSIS — N898 Other specified noninflammatory disorders of vagina: Secondary | ICD-10-CM

## 2013-03-06 DIAGNOSIS — R42 Dizziness and giddiness: Secondary | ICD-10-CM | POA: Diagnosis not present

## 2013-03-06 DIAGNOSIS — N760 Acute vaginitis: Secondary | ICD-10-CM | POA: Diagnosis not present

## 2013-03-06 DIAGNOSIS — R7303 Prediabetes: Secondary | ICD-10-CM

## 2013-03-06 DIAGNOSIS — N3090 Cystitis, unspecified without hematuria: Secondary | ICD-10-CM | POA: Diagnosis not present

## 2013-03-06 DIAGNOSIS — N390 Urinary tract infection, site not specified: Secondary | ICD-10-CM

## 2013-03-06 LAB — URINALYSIS, ROUTINE W REFLEX MICROSCOPIC
Bilirubin Urine: NEGATIVE
Glucose, UA: NEGATIVE mg/dL
Ketones, ur: NEGATIVE mg/dL
Protein, ur: NEGATIVE mg/dL
Urobilinogen, UA: 0.2 mg/dL (ref 0.0–1.0)

## 2013-03-06 LAB — WET PREP FOR TRICH, YEAST, CLUE
Clue Cells Wet Prep HPF POC: NONE SEEN
Trich, Wet Prep: NONE SEEN

## 2013-03-06 LAB — URINALYSIS, MICROSCOPIC ONLY

## 2013-03-06 MED ORDER — NYSTATIN 100000 UNIT/GM EX OINT: TOPICAL_OINTMENT | Freq: Two times a day (BID) | CUTANEOUS | Status: DC

## 2013-03-06 NOTE — Patient Instructions (Addendum)
Referral to Dr. Suszanne Conners for the vertigo Nystatin cream to be applied twice a day We will call with culture results F/U as previous

## 2013-03-06 NOTE — Progress Notes (Signed)
  Subjective:    Patient ID: LAKESIA DAHLE, female    DOB: 10-12-1975, 37 y.o.   MRN: [id number]  HPI  Patient here with discomfort and tingling sensation in her vaginal area. She was seen by urgent care secondary to some mild dysuria however this was more described as tingling sensation around her clitoris and vaginal area she also had some mild discharge. She had leukocytosis and was referred to urology. [facility] and was advised to have cystoscopy done however she canceled this as she was afraid of the invasive procedure. She denies any abdominal pain or actual burning sensation with urination. She feels "chills" around her quit clitoral region and urethra when she urinates. She did use over-the-counter Monistat which cleared up her symptoms completely however they return a few days later. Now on menses  She also like a referral to ear nose and throat again to followup her vertigo Note she stopped her zocor due to stomach cramps  Review of Systems - per above GEN- denies fatigue, fever, weight loss,weakness, recent illness CVS- denies chest pain, palpitations RESP- denies SOB, cough, wheeze ABD- denies N/V, change in stools, abd pain GU- denies dysuria, hematuria, dribbling, incontinence Neuro- denies headache, +dizziness, syncope, seizure activity       Objective:   Physical Exam  GEN- NAD, alert and oriented x3  ABD-NABS,soft, no CVA tenderness GU- normal external genitalia, vaginal mucosa pink and moist, cervix visualized no growth, +blood form os, minimal thin clear discharge, no CMT,difficult to palpate uterus/ normal urethra appearance, mild erythema in vaginal folds between labia majora EXT- No edema Pulse radial 2+        Assessment & Plan:

## 2013-03-07 DIAGNOSIS — N760 Acute vaginitis: Secondary | ICD-10-CM | POA: Insufficient documentation

## 2013-03-07 DIAGNOSIS — N3090 Cystitis, unspecified without hematuria: Secondary | ICD-10-CM | POA: Insufficient documentation

## 2013-03-07 NOTE — Assessment & Plan Note (Signed)
Treat with topical anti-fungal as this helped symptoms previously Wet prep shows no overwhelming infection

## 2013-03-07 NOTE — Assessment & Plan Note (Signed)
Referral to ENT at request, continued symptoms

## 2013-03-07 NOTE — Assessment & Plan Note (Signed)
Appears to have a sterile cystitis, repeat culture done today, if no improvement would recommend she have the cystoscopy

## 2013-03-08 ENCOUNTER — Ambulatory Visit: Payer: Medicare Other | Admitting: Family Medicine

## 2013-03-16 DIAGNOSIS — F319 Bipolar disorder, unspecified: Secondary | ICD-10-CM | POA: Diagnosis not present

## 2013-03-16 DIAGNOSIS — F4001 Agoraphobia with panic disorder: Secondary | ICD-10-CM | POA: Diagnosis not present

## 2013-03-20 DIAGNOSIS — N35919 Unspecified urethral stricture, male, unspecified site: Secondary | ICD-10-CM | POA: Diagnosis not present

## 2013-04-13 ENCOUNTER — Ambulatory Visit (INDEPENDENT_AMBULATORY_CARE_PROVIDER_SITE_OTHER): Payer: Medicare Other | Admitting: Otolaryngology

## 2013-04-13 DIAGNOSIS — R42 Dizziness and giddiness: Secondary | ICD-10-CM | POA: Diagnosis not present

## 2013-04-13 DIAGNOSIS — H9319 Tinnitus, unspecified ear: Secondary | ICD-10-CM | POA: Diagnosis not present

## 2013-05-05 ENCOUNTER — Ambulatory Visit: Payer: Self-pay | Admitting: Family Medicine

## 2013-05-30 ENCOUNTER — Ambulatory Visit: Payer: Medicare Other | Admitting: Adult Health

## 2013-06-08 DIAGNOSIS — F3110 Bipolar disorder, current episode manic without psychotic features, unspecified: Secondary | ICD-10-CM | POA: Diagnosis not present

## 2013-06-08 DIAGNOSIS — F429 Obsessive-compulsive disorder, unspecified: Secondary | ICD-10-CM | POA: Diagnosis not present

## 2013-07-19 ENCOUNTER — Other Ambulatory Visit (HOSPITAL_COMMUNITY)
Admission: RE | Admit: 2013-07-19 | Discharge: 2013-07-19 | Disposition: A | Discharge status: Home / Self Care | Payer: Medicare Other | Source: Ambulatory Visit | Attending: Adult Health | Admitting: Adult Health

## 2013-07-19 ENCOUNTER — Ambulatory Visit (INDEPENDENT_AMBULATORY_CARE_PROVIDER_SITE_OTHER): Payer: Medicare Other | Admitting: Adult Health

## 2013-07-19 ENCOUNTER — Encounter: Payer: Self-pay | Admitting: Adult Health

## 2013-07-19 VITALS — BP 130/80 | HR 80 | Ht 65.5 in | Wt 387.2 lb

## 2013-07-19 DIAGNOSIS — Z1151 Encounter for screening for human papillomavirus (HPV): Secondary | ICD-10-CM | POA: Diagnosis not present

## 2013-07-19 DIAGNOSIS — Z01419 Encounter for gynecological examination (general) (routine) without abnormal findings: Secondary | ICD-10-CM | POA: Diagnosis not present

## 2013-07-19 DIAGNOSIS — Z124 Encounter for screening for malignant neoplasm of cervix: Secondary | ICD-10-CM

## 2013-07-19 DIAGNOSIS — N3941 Urge incontinence: Secondary | ICD-10-CM | POA: Diagnosis not present

## 2013-07-19 HISTORY — DX: Urge incontinence: N39.41

## 2013-07-19 LAB — POCT URINALYSIS DIPSTICK
Glucose, UA: NEGATIVE
Leukocytes, UA: NEGATIVE
Nitrite, UA: NEGATIVE
Protein, UA: NEGATIVE

## 2013-07-19 NOTE — Progress Notes (Signed)
Patient ID: ANUJA MANKA, female   DOB: [birth year], 37 y.o.   MRN: [id number] History of Present Illness: Jasmarie is a 37 year old black female in for pap and physical.Notices odor in vagina.   Current Medications, Allergies, Past Medical History, Past Surgical History, Family History and Social History were reviewed in [employer] record.   Past Medical History  Diagnosis Date  . Hyperlipemia   . Hypertension   . Depression   . Asthma   . Prediabetes   . Paranoid   . Panic attacks   . PCOS (polycystic ovarian syndrome)   . Obesity   . Urge incontinence 07/19/2013   Past Surgical History  Procedure Laterality Date  . Insulin resistant    . Hysteroscopy diagnostic    Current outpatient prescriptions:acetaminophen (TYLENOL) 325 MG tablet, Take 650 mg by mouth every 6 (six) hours as needed., Disp: , Rfl: ;  clonazePAM (KLONOPIN) 0.5 MG tablet, Take 0.5 mg by mouth daily as needed., Disp: , Rfl: ;  enalapril (VASOTEC) 10 MG tablet, Take 1 tablet (10 mg total) by mouth daily., Disp: 30 tablet, Rfl: 6 meclizine (ANTIVERT) 12.5 MG tablet, Take 1 tablet (12.5 mg total) by mouth 3 (three) times daily as needed for dizziness or nausea., Disp: 30 tablet, Rfl: 1;  naproxen (NAPROSYN) 500 MG tablet, Take 1 tablet (500 mg total) by mouth 2 (two) times daily with a meal., Disp: 60 tablet, Rfl: 0;  nystatin ointment (MYCOSTATIN), Apply topically 2 (two) times daily., Disp: 30 g, Rfl: 1 OLANZapine (ZYPREXA) 15 MG tablet, Take 15 mg by mouth at bedtime., Disp: , Rfl: ;  pantoprazole (PROTONIX) 40 MG tablet, Take 1 tablet (40 mg total) by mouth daily., Disp: 30 tablet, Rfl: 3;  PRISTIQ 100 MG 24 hr tablet, Take 100 mg by mouth at bedtime. , Disp: , Rfl: ;  simvastatin (ZOCOR) 10 MG tablet, Take 1 tablet (10 mg total) by mouth every evening. For cholesterol, Disp: 30 tablet, Rfl: 6  Review of Systems: Patient denies any headaches, blurred vision, shortness of breath, chest pain, abdominal  pain, problems with bowel movements. Complains of urge incontinents, not having sex,moods OK sees Dr Posey Boyer joint swelling.Periods not so heavy but last about 8 days, tampons causecramping. Sees Dr Jeanice Lim for PCP.   Physical Exam:BP 130/80  Pulse 80  Ht 5' 5.5" (1.664 m)  Wt 387 lb 3.2 oz (175.633 kg)  BMI 63.43 kg/m2  LMP 07/13/2013 General:  Well developed, well nourished, no acute distress Skin:  Warm and dry Neck:  Midline trachea, normal thyroid Lungs; Clear to auscultation bilaterally Breast:  No dominant palpable mass, retraction, or nipple discharge Cardiovascular: Regular rate and rhythm Abdomen:  Soft, non tender, no hepatosplenomegaly,morbid obese Pelvic:  External genitalia is normal in appearance.Vitiligo left buttock.  The vagina is normal in appearance. No odor or discharge today. The cervix is poorly seen due vaginal sidewall collapse, pap with HPV performed.  Uterus is felt to be normal size, shape, and contour.  No adnexal masses or tenderness noted.Difficult secondary to abdominal girth. Extremities:  No swelling or varicosities noted Psych:  Alert and cooperative, seems happy   Impression: Yearly gyn exam Urge incontinence Obesity    Plan: Try kegels, weight loss encouraged, decrease caffeine Physical in 1 year  Mammogram at 38  Get flu shot at PCP and labs with PCP

## 2013-07-19 NOTE — Patient Instructions (Signed)
Try kegels, weight loss, void often Physical in 1 year if pap normal pap in 3 years Mammogram at 38  Labs PCP Get flu shot Urinary Incontinence Urinary incontinence is the involuntary loss of urine from your bladder. CAUSES  There are many causes of urinary incontinence. They include:  Medicines.  Infections.  Prostatic enlargement, leading to overflow of urine from your bladder.  Surgery.  Neurological diseases.  Emotional factors. SIGNS AND SYMPTOMS Urinary Incontinence can be divided into four types: 1. Urge incontinence Urge incontinence is the involuntary loss of urine before you have the opportunity to go to the bathroom. There is an sudden urge to void but not enough time to reach a bathroom. 2. Stress incontinence Stress incontinence is the sudden loss of urine with any activity that forces urine to pass. It is commonly caused by anatomical changes to the pelvis and sphincter areas of your body. 3. Overflow incontinence Overflow incontinence is the loss of urine from an obstructed opening to your bladder. This results in a backup of urine and a resultant buildup of pressure within the bladder. When the pressure within the bladder exceeds the closing pressure of the sphincter, the urine overflows, which causes incontinence, similar to water overflowing a dam. 4. Total incontinence Total incontinence is the loss of urine as a result of the inability to store urine within your bladder. DIAGNOSIS  Evaluating the cause of incontinence may require:  A thorough and complete medical and obstetric history.  A complete physical exam.  Laboratory tests such as a urine culture and sensitivities. When additional tests are indicated, they can include:  An ultrasound exam.  Kidney and bladder X-rays.  Cystoscopy. This is an exam of the bladder using a narrow scope.  Urodynamic testing to test the nerve function to the bladder and sphincter areas. TREATMENT  Treatment for urinary  incontinence depends on the cause:  For urge incontinence caused by a bacterial infection, antibiotics will be prescribed. If the urge incontinence is related to medicines you take, your health care provider may have you change the medicine.  For stress incontinence, surgery to re-establish anatomical support to the bladder or sphincter, or both, will often correct the condition.  For overflow incontinence caused by an enlarged prostate, an operation to open the channel through the enlarged prostate will allow the flow of urine out of the bladder. In women with fibroids, a hysterectomy may be recommended.  For total incontinence, surgery on your urinary sphincter may help. An artificial urinary sphincter (an inflatable cuff placed around the urethra) may be required. In women who have developed a hole-like passage between their bladder and vagina (vesicovaginal fistula), surgery to close the fistula often is required. HOME CARE INSTRUCTIONS  Normal daily hygiene and the use of pads or adult diapers that are changed regularly will help prevent odors and skin damage.  Avoid caffeine. It can overstimulate your bladder.  Use the bathroom regularly. Try about every 2 3 hours to go to the bathroom, even if you do not feel the need to do so. Take time to empty your bladder completely. After urinating, wait a minute. Then try to urinate again.  For causes involving nerve dysfunction, keep a log of the medicines you take and a journal of the times you go to the bathroom. SEEK MEDICAL CARE IF:  You experience worsening of pain instead of improvement in pain after your procedure.  Your incontinence becomes worse instead of better. SEE IMMEDIATE MEDICAL CARE IF:  You experience  fever or shaking chills.  You are unable to pass your urine.  You have redness spreading into your groin or down into your thighs. MAKE SURE YOU:   Understand these instructions.   Will watch your condition.  Will get  help right away if you are not doing well or get worse. Document Released: 10/08/2004 Document Revised: 05/03/2013 Document Reviewed: 02/07/2013 [facility] Patient Information 2014 [place], [place].

## 2013-08-08 IMAGING — CR DG CHEST 2V
2 series · 2 of 2 positions shown · non-contrast
Comparison: 10/09/2009 radiographs.

CLINICAL DATA: Chest pain.

CHEST - 2 VIEW

[view not recorded (1 of 2)]
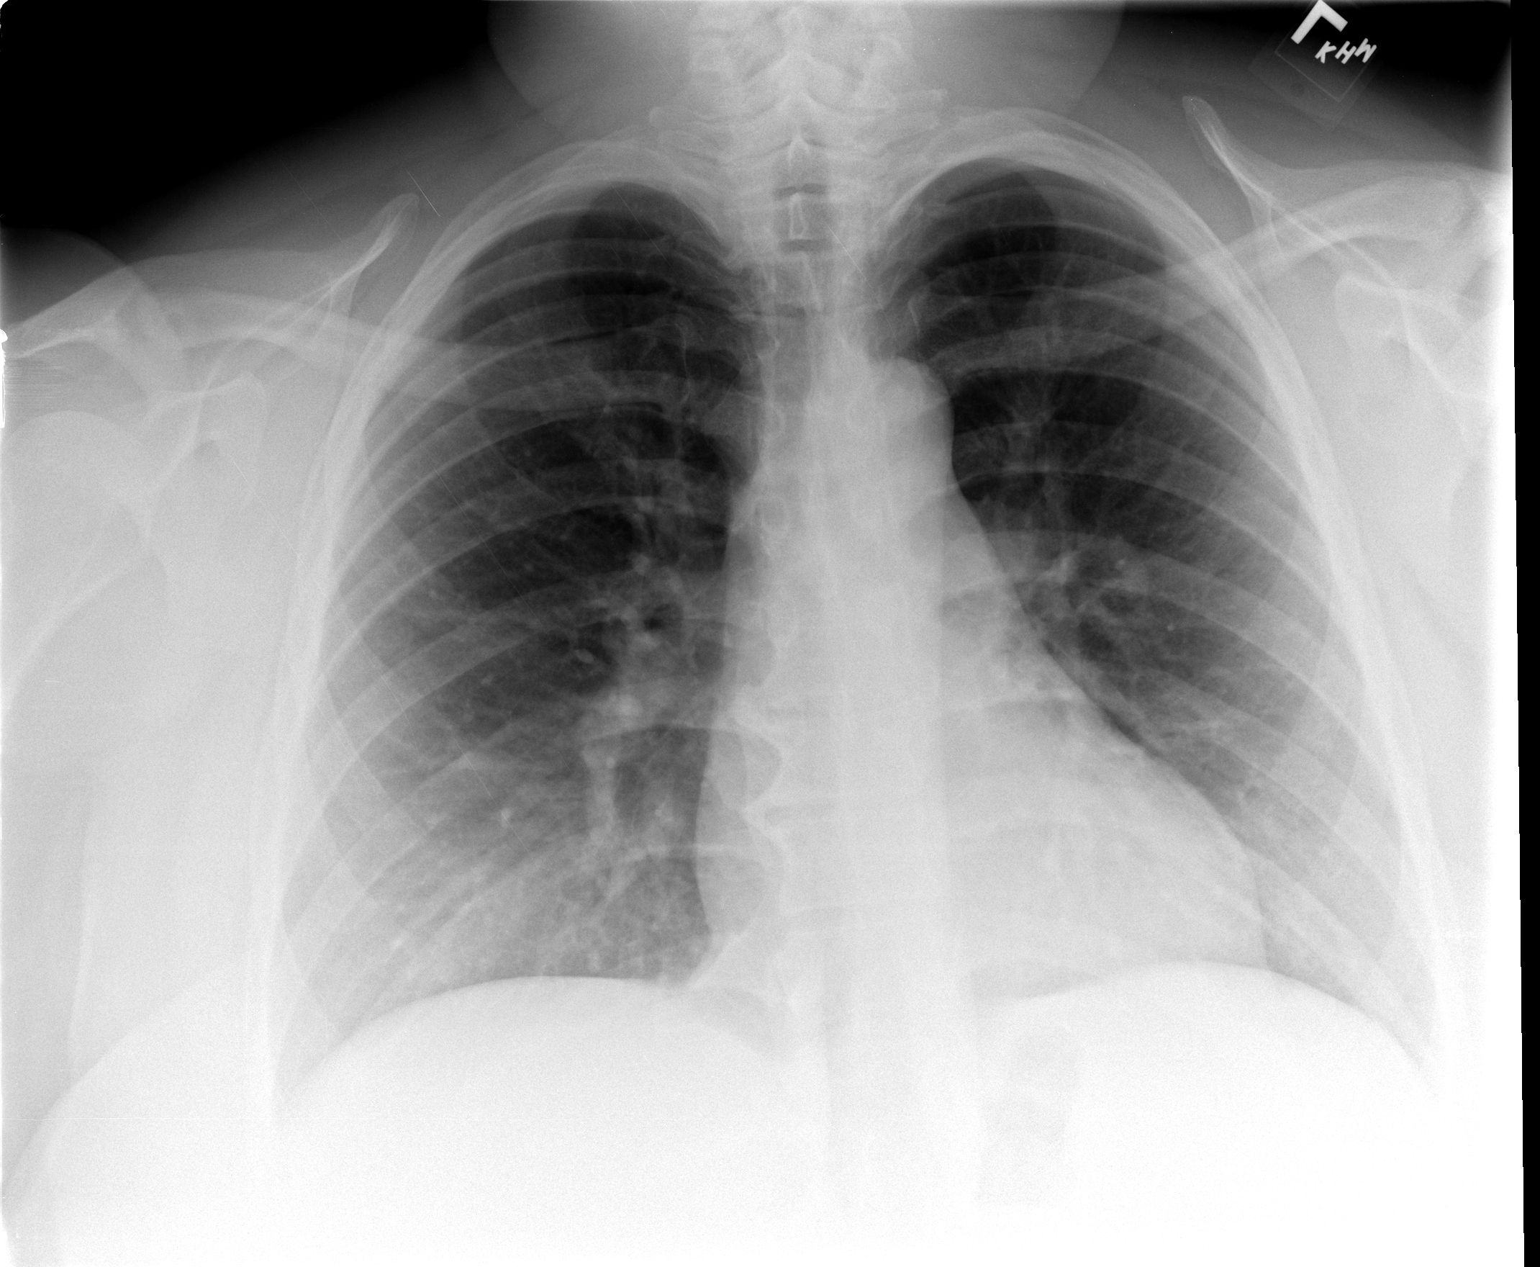

[view not recorded (2 of 2)]
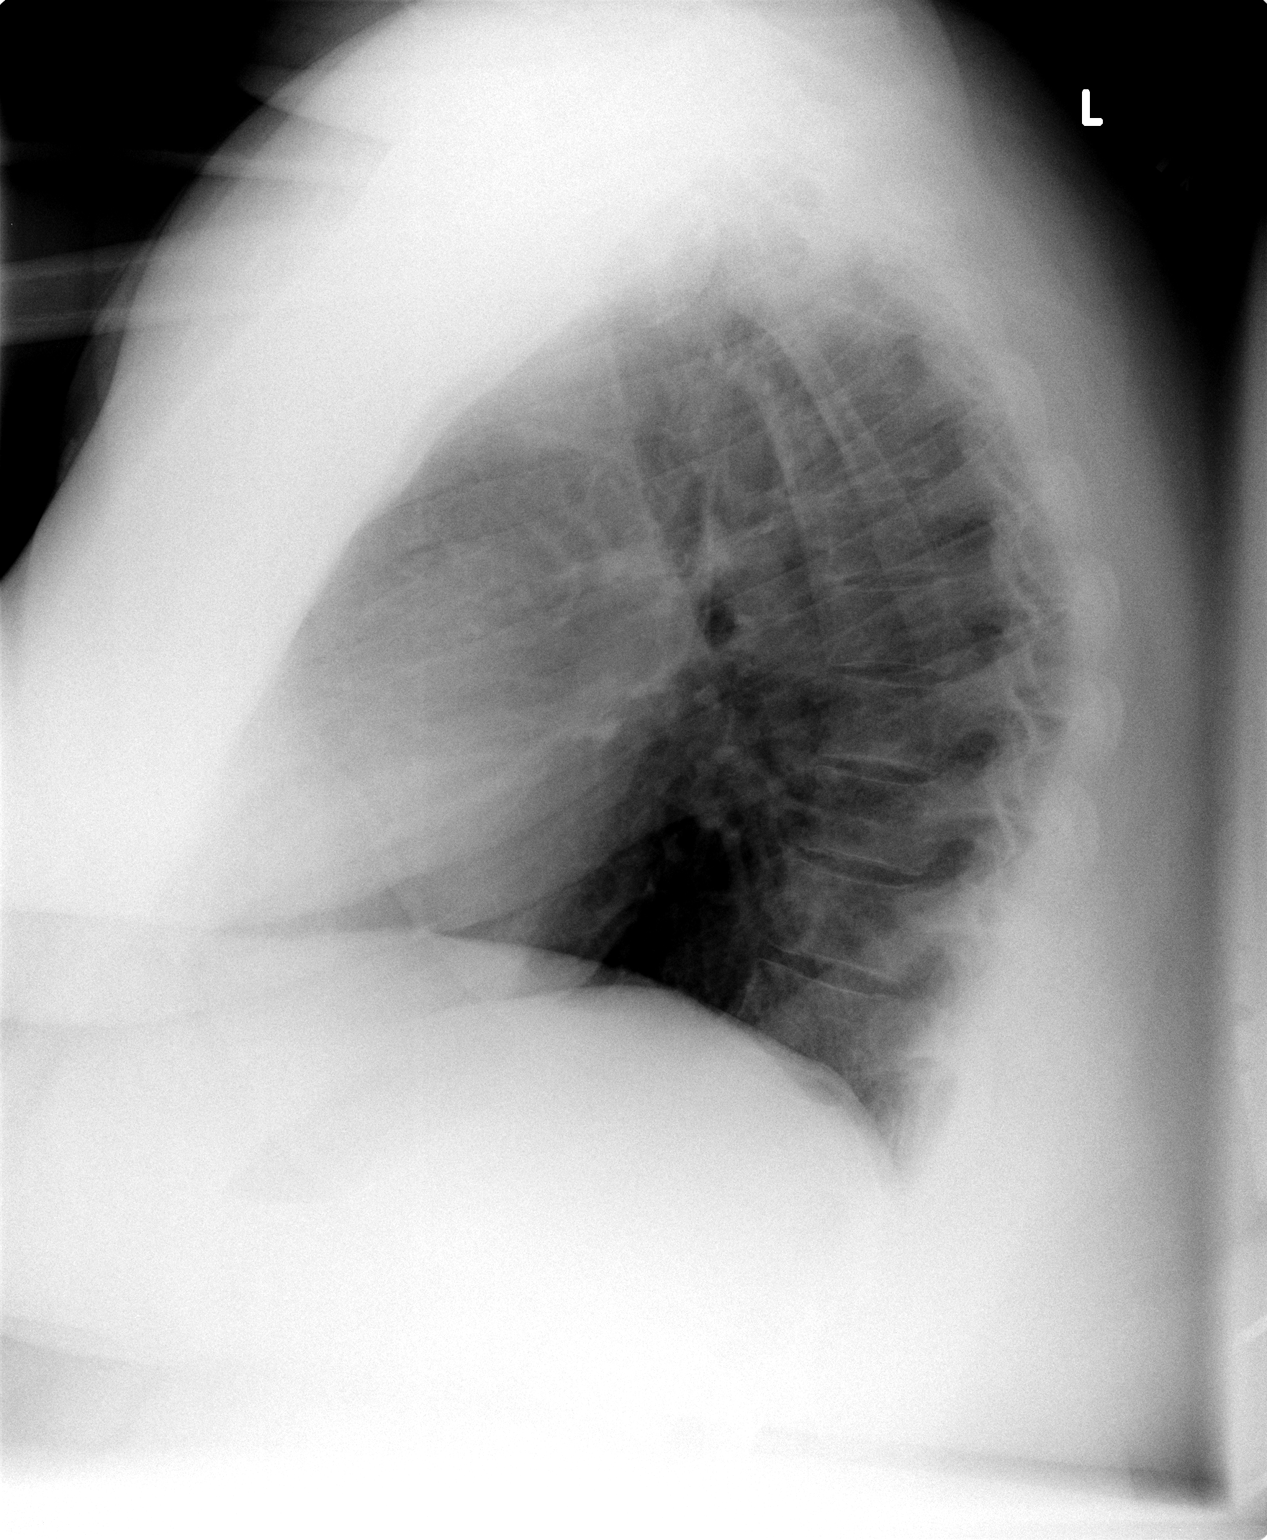

[2 of 2 positions shown; findings below may reference images not displayed]

FINDINGS: The heart size and mediastinal contours are stable.
Previously demonstrated linear atelectasis has resolved.  The lungs
are now clear.  There is no pleural effusion or pneumothorax.
Thoracic spine osteophytes are noted.
IMPRESSION: Resolved basilar atelectasis.  No acute cardiopulmonary process.

## 2013-09-22 ENCOUNTER — Encounter: Payer: Self-pay | Admitting: Family Medicine

## 2013-09-22 ENCOUNTER — Other Ambulatory Visit: Payer: Self-pay | Admitting: Family Medicine

## 2013-09-22 NOTE — Telephone Encounter (Signed)
Medication refill for one time only.  Patient needs to be seen.  Letter sent for patient to call and schedule 

## 2013-10-13 ENCOUNTER — Other Ambulatory Visit: Payer: Self-pay | Admitting: Family Medicine

## 2013-10-13 ENCOUNTER — Encounter: Payer: Self-pay | Admitting: Family Medicine

## 2013-10-13 ENCOUNTER — Ambulatory Visit (INDEPENDENT_AMBULATORY_CARE_PROVIDER_SITE_OTHER): Payer: Medicare Other | Admitting: Family Medicine

## 2013-10-13 VITALS — BP 138/88 | HR 82 | Temp 98.0°F | Resp 18 | Ht 65.0 in | Wt 396.0 lb

## 2013-10-13 DIAGNOSIS — Z23 Encounter for immunization: Secondary | ICD-10-CM | POA: Diagnosis not present

## 2013-10-13 DIAGNOSIS — J069 Acute upper respiratory infection, unspecified: Secondary | ICD-10-CM | POA: Diagnosis not present

## 2013-10-13 DIAGNOSIS — E785 Hyperlipidemia, unspecified: Secondary | ICD-10-CM | POA: Diagnosis not present

## 2013-10-13 DIAGNOSIS — I10 Essential (primary) hypertension: Secondary | ICD-10-CM | POA: Diagnosis not present

## 2013-10-13 DIAGNOSIS — R609 Edema, unspecified: Secondary | ICD-10-CM

## 2013-10-13 DIAGNOSIS — R7303 Prediabetes: Secondary | ICD-10-CM

## 2013-10-13 DIAGNOSIS — R7309 Other abnormal glucose: Secondary | ICD-10-CM

## 2013-10-13 LAB — CBC
HCT: 37.1 % (ref 36.0–46.0)
Hemoglobin: 11.7 g/dL — ABNORMAL LOW (ref 12.0–15.0)
MCH: 26.4 pg (ref 26.0–34.0)
MCHC: 31.5 g/dL (ref 30.0–36.0)
MCV: 83.6 fL (ref 78.0–100.0)
PLATELETS: 426 10*3/uL — AB (ref 150–400)
RBC: 4.44 MIL/uL (ref 3.87–5.11)
RDW: 15.1 % (ref 11.5–15.5)
WBC: 11.9 10*3/uL — ABNORMAL HIGH (ref 4.0–10.5)

## 2013-10-13 MED ORDER — HYDROCHLOROTHIAZIDE 12.5 MG PO TABS: 12.5000 mg | ORAL_TABLET | Freq: Every day | ORAL | Status: DC

## 2013-10-13 MED ORDER — FUROSEMIDE 20 MG PO TABS: 20.0000 mg | ORAL_TABLET | Freq: Every day | ORAL | Status: DC

## 2013-10-13 MED ORDER — CYCLOBENZAPRINE HCL 10 MG PO TABS: 10.0000 mg | ORAL_TABLET | Freq: Every evening | ORAL | Status: DC | PRN

## 2013-10-13 MED ORDER — ENALAPRIL MALEATE 10 MG PO TABS: ORAL_TABLET | ORAL | Status: DC

## 2013-10-13 NOTE — Assessment & Plan Note (Signed)
Check fasting lipid panel today she was unable to tolerate the Zocor we might have to try her on pravastatin or Zetia

## 2013-10-13 NOTE — Assessment & Plan Note (Signed)
I discussed the importance of weight loss she is not very motivated she tends to eat a lot of junk food and sweets and multiple sodas throughout the day

## 2013-10-13 NOTE — Assessment & Plan Note (Signed)
Blood pressure looks okay today her diastolic is a little elevated I will at on the low dose Lasix with her leg swelling. I think this is more do to her increasing weight. She's not been able to tolerate hydrochlorothiazide in the past

## 2013-10-13 NOTE — Progress Notes (Signed)
   Subjective:    Patient ID: Jasmin Castro, female    DOB: 27-Aug-1976, 38 y.o.   MRN: [id number]  HPI  Patient here follow chronic medical problems. She does complain of some muscle spasms in her shoulder and will like to have a course of Flexeril to help with this. She has been taking her blood pressure medication on a regular basis and has not had any difficulties with the medication. She hasn't suffered with a little bit of cough headache and nasal drainage which is similar symptoms from her family they were all sick at the same time. Her cough is much improved and her headaches are now improving she does use Tylenol as needed.  She still following along with her psychiatrist however her appointment is not until May she is no longer on her anxiolytic medication as it made her more aggressive.  Review of Systems  GEN- denies fatigue, fever, weight loss,weakness, recent illness HEENT- denies eye drainage, change in vision, nasal discharge, CVS- denies chest pain, palpitations RESP- denies SOB, cough, wheeze ABD- denies N/V, change in stools, abd pain GU- denies dysuria, hematuria, dribbling, incontinence MSK- + joint pain, muscle aches, injury Neuro- denies headache, dizziness, syncope, seizure activity      Objective:   Physical Exam GEN- NAD, alert and oriented x3, obese HEENT- PERRL, EOMI, non injected sclera, pink conjunctiva, MMM, oropharynx clear, TM Clear bilat, nares clear Neck- Supple, no LAD CVS- RRR, no murmur RESP-CTAB EXT- + pitting LE edema about 2-3 cm above ankles Pulses- Radial 2+ Psych- very polite, Normal affect and mood        Assessment & Plan:

## 2013-10-13 NOTE — Assessment & Plan Note (Signed)
A1c to done

## 2013-10-13 NOTE — Assessment & Plan Note (Signed)
Supportive care. She can continue Tylenol as needed for the headache she does not need any antibiotics at this time

## 2013-10-13 NOTE — Patient Instructions (Addendum)
Flu shot given You can take mucinex DM  Start new diuretic lasix once a day for swelling and blood pressure We will call with lab results  F/U 8 weeks

## 2013-10-14 LAB — BASIC METABOLIC PANEL
BUN: 7 mg/dL (ref 6–23)
CALCIUM: 9 mg/dL (ref 8.4–10.5)
CO2: 32 mEq/L (ref 19–32)
Chloride: 103 mEq/L (ref 96–112)
Creat: 0.66 mg/dL (ref 0.50–1.10)
Glucose, Bld: 86 mg/dL (ref 70–99)
Potassium: 3.9 mEq/L (ref 3.5–5.3)
SODIUM: 139 meq/L (ref 135–145)

## 2013-10-14 LAB — LIPID PANEL
Cholesterol: 223 mg/dL — ABNORMAL HIGH (ref 0–200)
HDL: 53 mg/dL
LDL Cholesterol: 145 mg/dL — ABNORMAL HIGH (ref 0–99)
Total CHOL/HDL Ratio: 4.2 ratio
Triglycerides: 127 mg/dL
VLDL: 25 mg/dL (ref 0–40)

## 2013-10-14 LAB — HEMOGLOBIN A1C
HEMOGLOBIN A1C: 5.6 % (ref <5.7)
Mean Plasma Glucose: 114 mg/dL (ref <117)

## 2013-10-15 MED ORDER — PRAVASTATIN SODIUM 20 MG PO TABS: 40.0000 mg | ORAL_TABLET | Freq: Every day | ORAL | Status: DC

## 2013-10-15 NOTE — Addendum Note (Signed)
Addended by: Vic Blackbird F on: 10/15/2013 06:52 PM   Modules accepted: Orders

## 2013-10-16 ENCOUNTER — Other Ambulatory Visit: Payer: Self-pay | Admitting: *Deleted

## 2013-10-16 ENCOUNTER — Telehealth: Payer: Self-pay | Admitting: *Deleted

## 2013-10-16 DIAGNOSIS — E785 Hyperlipidemia, unspecified: Secondary | ICD-10-CM

## 2013-10-16 MED ORDER — PRAVASTATIN SODIUM 20 MG PO TABS: 20.0000 mg | ORAL_TABLET | Freq: Every day | ORAL | Status: DC

## 2013-10-16 NOTE — Telephone Encounter (Signed)
Message copied by Maureen Chatters on Mon Oct 16, 2013  2:11 PM ------      Message from: Lenore Manner      Created: Mon Oct 16, 2013  9:36 AM      Regarding: Pharmacy question       Contact: [phone]       Pharmacy is calling because they have a question about the pts medication Pravoachol.      [facility] is the pharmacy  ------

## 2013-10-16 NOTE — Telephone Encounter (Signed)
Called pharmacy and they were wanting clarification on the pravastatin either 1 45m tablet or #60 on 224m tablet, I asked Dr. DuBuelah Manishich it was adn she only wanted pt to be on 2058mnce daily., called pharmacy back and confirmed it was suppose to be 63m40mce daily.

## 2013-10-24 ENCOUNTER — Telehealth: Payer: Self-pay | Admitting: Family Medicine

## 2013-10-24 NOTE — Telephone Encounter (Signed)
Pt is taking lasix and it is giving her a headache and she is going to take it as needed  Call back number is [phone]

## 2013-10-26 NOTE — Telephone Encounter (Signed)
noted 

## 2013-12-08 ENCOUNTER — Ambulatory Visit: Payer: Medicare Other | Admitting: Family Medicine

## 2014-01-23 ENCOUNTER — Ambulatory Visit (INDEPENDENT_AMBULATORY_CARE_PROVIDER_SITE_OTHER): Payer: Medicare Other | Admitting: Family Medicine

## 2014-01-23 ENCOUNTER — Encounter: Payer: Self-pay | Admitting: Family Medicine

## 2014-01-23 VITALS — BP 148/76 | HR 64 | Temp 97.8°F | Resp 18 | Ht 66.0 in | Wt 398.0 lb

## 2014-01-23 DIAGNOSIS — R7303 Prediabetes: Secondary | ICD-10-CM

## 2014-01-23 DIAGNOSIS — M779 Enthesopathy, unspecified: Secondary | ICD-10-CM

## 2014-01-23 DIAGNOSIS — I10 Essential (primary) hypertension: Secondary | ICD-10-CM | POA: Diagnosis not present

## 2014-01-23 DIAGNOSIS — M65849 Other synovitis and tenosynovitis, unspecified hand: Secondary | ICD-10-CM

## 2014-01-23 DIAGNOSIS — M65839 Other synovitis and tenosynovitis, unspecified forearm: Secondary | ICD-10-CM

## 2014-01-23 DIAGNOSIS — E785 Hyperlipidemia, unspecified: Secondary | ICD-10-CM

## 2014-01-23 DIAGNOSIS — R7309 Other abnormal glucose: Secondary | ICD-10-CM | POA: Diagnosis not present

## 2014-01-23 DIAGNOSIS — M778 Other enthesopathies, not elsewhere classified: Secondary | ICD-10-CM | POA: Insufficient documentation

## 2014-01-23 LAB — COMPREHENSIVE METABOLIC PANEL
ALBUMIN: 3.4 g/dL — AB (ref 3.5–5.2)
ALT: 10 U/L (ref 0–35)
AST: 12 U/L (ref 0–37)
Alkaline Phosphatase: 135 U/L — ABNORMAL HIGH (ref 39–117)
BUN: 6 mg/dL (ref 6–23)
CALCIUM: 9.1 mg/dL (ref 8.4–10.5)
CHLORIDE: 104 meq/L (ref 96–112)
CO2: 25 meq/L (ref 19–32)
Creat: 0.62 mg/dL (ref 0.50–1.10)
GLUCOSE: 94 mg/dL (ref 70–99)
POTASSIUM: 4.1 meq/L (ref 3.5–5.3)
Sodium: 137 mEq/L (ref 135–145)
Total Bilirubin: 0.4 mg/dL (ref 0.2–1.2)
Total Protein: 6.7 g/dL (ref 6.0–8.3)

## 2014-01-23 LAB — LIPID PANEL
CHOLESTEROL: 174 mg/dL (ref 0–200)
HDL: 46 mg/dL (ref >39)
LDL Cholesterol: 115 mg/dL — ABNORMAL HIGH (ref 0–99)
Total CHOL/HDL Ratio: 3.8 Ratio
Triglycerides: 67 mg/dL (ref <150)
VLDL: 13 mg/dL (ref 0–40)

## 2014-01-23 LAB — HEMOGLOBIN A1C
Hgb A1c MFr Bld: 5.4 % (ref <5.7)
Mean Plasma Glucose: 108 mg/dL (ref <117)

## 2014-01-23 NOTE — Progress Notes (Signed)
Patient ID: Jasmin Castro, female   DOB: 04-20-1976, 38 y.o.   MRN: [id number]   Subjective:    Patient ID: Jasmin Castro, female    DOB: 02/28/1976, 38 y.o.   MRN: [id number]  Patient presents for F/U, N/V and Sprain to L arm  patient here to follow chronic medical problems. In interim she has had a couple of acute illnesses. She's had some pain in her left forearm for the past 3 weeks states it is now improved and she's been using absorbent Junior on it. She does remember picking up some heavy bags on that arm. At one point she feels like her hand was weak and she was dropping items but this is now resolved. She's able to bend at the elbow and shoulder without any difficulty.  Nausea vomiting headache that lasted a week this is now resolved.  Hypertension hyperlipidemia she is due for repeat fasting labs she's also prediabetic and her weight is unchanged. She eats a lot of junk food and sugary foods and carbs. She also drinks a significant amount of soda.  Of note she was seen by her psychiatrist recently and had some medication changes. She's now on benzo diazepam at bedtime.    Review Of Systems:  GEN- denies fatigue, fever, weight loss,weakness, recent illness HEENT- denies eye drainage, change in vision, nasal discharge, CVS- denies chest pain, palpitations RESP- denies SOB, cough, wheeze ABD- denies N/V, change in stools, abd pain GU- denies dysuria, hematuria, dribbling, incontinence MSK- + joint pain, muscle aches, injury Neuro- denies headache, dizziness, syncope, seizure activity       Objective:    BP 148/76  Pulse 64  Temp(Src) 97.8 F (36.6 C) (Oral)  Resp 18  Ht 5' 6"  (1.676 m)  Wt 398 lb (180.532 kg)  BMI 64.27 kg/m2  LMP 01/19/2014 GEN- NAD, alert and oriented x3 HEENT- PERRL, EOMI, non injected sclera, pink conjunctiva, MMM, oropharynx clear Neck- Supple, FROM CVS- RRR, no murmur RESP-CTAB ABD-NABS,soft,NT,ND MSK- Left shoulder/arm normal inspection,  rotator cuff in tact, good ROM arm, forearm, elbow, mild TTP over foearm extensors, no swelling, normal wrist ROm, normal grasp EXT- Trace edema Pulses- Radial, DP- 2+        Assessment & Plan:      Problem List Items Addressed This Visit   Prediabetes   Relevant Orders      Hemoglobin A1c (Completed)   Morbid obesity   Hypertension   Hyperlipidemia - Primary   Relevant Orders      Comprehensive metabolic panel (Completed)      Lipid panel (Completed)      Note: This dictation was prepared with Dragon dictation along with smaller phrase technology. Any transcriptional errors that result from this process are unintentional.

## 2014-01-23 NOTE — Assessment & Plan Note (Signed)
Her blood pressure looks okay today we'll continue current medications

## 2014-01-23 NOTE — Assessment & Plan Note (Signed)
She has a benign exam. Advised the use of over-the-counter anti-inflammatories by mouth or she can continue the absorbent junior since this is helping are ready.

## 2014-01-23 NOTE — Assessment & Plan Note (Signed)
Discussed importance of weight loss and change in diet. We spent approximately 10 minutes on dietary changes and what she can do.

## 2014-01-23 NOTE — Assessment & Plan Note (Signed)
Recheck lipid panel

## 2014-01-23 NOTE — Patient Instructions (Signed)
Zyrtec over the counter generic you can try- take in the morning Okay to continue the absorbant junior You can take Ibuprofen 486m twice a day  Ice if needed We will call with results 1 diet soda a day Water Cut out the white foods" bread, pasta, rice, biscuits, french fries,pizza, potatoes" F/U 3 months

## 2014-02-15 ENCOUNTER — Other Ambulatory Visit: Payer: Self-pay | Admitting: Family Medicine

## 2014-02-15 NOTE — Telephone Encounter (Signed)
Refill appropriate and filled per protocol. 

## 2014-03-15 ENCOUNTER — Other Ambulatory Visit: Payer: Self-pay | Admitting: Family Medicine

## 2014-03-15 NOTE — Telephone Encounter (Signed)
Refill appropriate and filled per protocol. 

## 2014-03-21 ENCOUNTER — Other Ambulatory Visit: Payer: Self-pay | Admitting: Family Medicine

## 2014-03-22 NOTE — Telephone Encounter (Signed)
Ok to refill?  Last office visit 01/23/2014.

## 2014-03-23 NOTE — Telephone Encounter (Signed)
okay

## 2014-03-23 NOTE — Telephone Encounter (Signed)
Prescription sent to pharmacy.

## 2014-03-27 ENCOUNTER — Encounter: Payer: Self-pay | Admitting: Family Medicine

## 2014-03-27 ENCOUNTER — Ambulatory Visit (INDEPENDENT_AMBULATORY_CARE_PROVIDER_SITE_OTHER): Payer: Medicare Other | Admitting: Family Medicine

## 2014-03-27 VITALS — BP 140/80 | HR 78 | Temp 97.9°F | Resp 12 | Ht 66.0 in | Wt 390.0 lb

## 2014-03-27 DIAGNOSIS — J020 Streptococcal pharyngitis: Secondary | ICD-10-CM

## 2014-03-27 DIAGNOSIS — I10 Essential (primary) hypertension: Secondary | ICD-10-CM

## 2014-03-27 DIAGNOSIS — J029 Acute pharyngitis, unspecified: Secondary | ICD-10-CM | POA: Diagnosis not present

## 2014-03-27 DIAGNOSIS — A088 Other specified intestinal infections: Secondary | ICD-10-CM | POA: Diagnosis not present

## 2014-03-27 DIAGNOSIS — A084 Viral intestinal infection, unspecified: Secondary | ICD-10-CM

## 2014-03-27 LAB — RAPID STREP SCREEN (MED CTR MEBANE ONLY): Streptococcus, Group A Screen (Direct): POSITIVE — AB

## 2014-03-27 MED ORDER — ENALAPRIL MALEATE 20 MG PO TABS: ORAL_TABLET | ORAL | Status: DC

## 2014-03-27 MED ORDER — AMOXICILLIN 500 MG PO CAPS: 500.0000 mg | ORAL_CAPSULE | Freq: Two times a day (BID) | ORAL | Status: DC

## 2014-03-27 MED ORDER — MECLIZINE HCL 12.5 MG PO TABS: ORAL_TABLET | ORAL | Status: DC

## 2014-03-27 NOTE — Assessment & Plan Note (Signed)
Increase enalapril to 69m once a day

## 2014-03-27 NOTE — Patient Instructions (Signed)
Take 2 blood pressure pills once a day until you run out Take antibiotics for strep throat Use imodium if needed for diarrhea Plenty of fluids   Strep Throat Strep throat is an infection of the throat. It is caused by a germ. Strep throat spreads from person to person by coughing, sneezing, or close contact. HOME CARE  Rinse your mouth (gargle) with warm salt water (1 teaspoon salt in 1 cup of water). Do this 3 to 4 times per day or as needed for comfort.  Family members with a sore throat or fever should see a doctor.  Make sure everyone in your house washes their hands well.  Do not share food, drinking cups, or personal items.  Eat soft foods until your sore throat gets better.  Drink enough water and fluids to keep your pee (urine) clear or pale yellow.  Rest.  Stay home from school, daycare, or work until you have taken medicine for 24 hours.  Only take medicine as told by your doctor.  Take your medicine as told. Finish it even if you start to feel better. GET HELP RIGHT AWAY IF:   You have new problems, such as throwing up (vomiting) or bad headaches.  You have a stiff or painful neck, chest pain, trouble breathing, or trouble swallowing.  You have very bad throat pain, drooling, or changes in your voice.  Your neck puffs up (swells) or gets red and tender.  You have a fever.  You are very tired, your mouth is dry, or you are peeing less than normal.  You cannot wake up completely.  You get a rash, cough, or earache.  You have green, yellow-brown, or bloody spit.  Your pain does not get better with medicine. MAKE SURE YOU:   Understand these instructions.  Will watch your condition.  Will get help right away if you are not doing well or get worse. Document Released: 02/17/2008 Document Revised: 11/23/2011 Document Reviewed: 10/30/2010 [facility] Patient Information 2015 [place], [place]. This information is not intended to replace advice given to you by  your health care provider. Make sure you discuss any questions you have with your health care provider.

## 2014-03-27 NOTE — Progress Notes (Signed)
Patient ID: RANDIE BLOODGOOD, female   DOB: Mar 25, 1976, 38 y.o.   MRN: [id number]   Subjective:    Patient ID: [name], female    DOB: Jan 18, 1976, 38 y.o.   MRN: [id number]  Patient presents for Stomach pain and Throat issue  patient here with acute visit with sore throat scratchy itchy for the past week or so. She's also had a few episodes of diarrhea around the same time. At most may have up to 3 loose stools a day no blood. Diarrhea is associated with cramping She denies any nausea vomiting. She did have an episode of her vertigo when her symptoms started but that is now resolved. She denies any cough or congestion denies any fever. Positive contact with her father who has an upper respiratory infection.    Review Of Systems: - per above  GEN- denies fatigue, fever, weight loss,weakness, recent illness HEENT- denies eye drainage, change in vision, nasal discharge, CVS- denies chest pain, palpitations RESP- denies SOB, cough, wheeze ABD- denies N/V,+ change in stools, +abd pain Neuro- denies headache, dizziness, syncope, seizure activity       Objective:    BP 140/80  Pulse 78  Temp(Src) 97.9 F (36.6 C) (Oral)  Resp 12  Ht 5' 6"  (1.676 m)  Wt 390 lb (176.903 kg)  BMI 62.98 kg/m2 GEN- NAD, alert and oriented x3 HEENT- PERRL, EOMI, non injected sclera, pink conjunctiva, MMM, oropharynx +injection, TM clear bilat no effusion, no  maxillary sinus tenderness, nares clear  Neck- Supple, shott LAD CVS- RRR, no murmur RESP-CTAB ABD-NABS Soft, NT,ND, large pannus EXT- No edema Pulses- Radial 2+          Assessment & Plan:      Problem List Items Addressed This Visit   Hypertension     Increase enalapril to 34m once a day    Relevant Medications      enalapril (VASOTEC) tablet    Other Visit Diagnoses   Viral gastroenteritis    -  Primary    Supportive care, fluids, immodium if needed    Sore throat            Relevant Orders       Rapid Strep Screen  (Completed)    Strep pharyngitis        Positive strep. treat with augmentin, Given cepacol, salt water gargles       Note: This dictation was prepared with Dragon dictation along with smaller phrase technology. Any transcriptional errors that result from this process are unintentional.

## 2014-04-25 ENCOUNTER — Encounter: Payer: Self-pay | Admitting: Family Medicine

## 2014-04-25 ENCOUNTER — Ambulatory Visit (INDEPENDENT_AMBULATORY_CARE_PROVIDER_SITE_OTHER): Payer: Medicare Other | Admitting: Family Medicine

## 2014-04-25 VITALS — BP 126/72 | HR 76 | Temp 97.7°F | Resp 14 | Ht 65.0 in | Wt 391.0 lb

## 2014-04-25 DIAGNOSIS — E785 Hyperlipidemia, unspecified: Secondary | ICD-10-CM | POA: Diagnosis not present

## 2014-04-25 DIAGNOSIS — I10 Essential (primary) hypertension: Secondary | ICD-10-CM

## 2014-04-25 LAB — COMPREHENSIVE METABOLIC PANEL
ALK PHOS: 125 U/L — AB (ref 39–117)
ALT: 11 U/L (ref 0–35)
AST: 13 U/L (ref 0–37)
Albumin: 3.8 g/dL (ref 3.5–5.2)
BUN: 9 mg/dL (ref 6–23)
CO2: 27 mEq/L (ref 19–32)
Calcium: 9.1 mg/dL (ref 8.4–10.5)
Chloride: 104 mEq/L (ref 96–112)
Creat: 0.68 mg/dL (ref 0.50–1.10)
Glucose, Bld: 93 mg/dL (ref 70–99)
POTASSIUM: 4.3 meq/L (ref 3.5–5.3)
Sodium: 140 mEq/L (ref 135–145)
Total Bilirubin: 0.4 mg/dL (ref 0.2–1.2)
Total Protein: 6.7 g/dL (ref 6.0–8.3)

## 2014-04-25 LAB — LIPID PANEL
Cholesterol: 163 mg/dL (ref 0–200)
HDL: 44 mg/dL (ref >39)
LDL CALC: 101 mg/dL — AB (ref 0–99)
Total CHOL/HDL Ratio: 3.7 Ratio
Triglycerides: 89 mg/dL (ref <150)
VLDL: 18 mg/dL (ref 0–40)

## 2014-04-25 NOTE — Patient Instructions (Addendum)
Continue current medications Work on your diet, low fat, low carb  We will call with labs  F/U 4 months

## 2014-04-25 NOTE — Assessment & Plan Note (Signed)
Blood pressure is well-controlled no change in medication

## 2014-04-25 NOTE — Progress Notes (Signed)
Patient ID: Jasmin Castro, female   DOB: 1975/10/23, 38 y.o.   MRN: [id number]   Subjective:    Patient ID: Jasmin Castro, female    DOB: 1976/06/26, 38 y.o.   MRN: [id number]  Patient presents for 3 month F/U  patient here follow chronic medical problems. She's no specific concerns today. She still down 7 pounds from her heaviest weight. She's not been following a diet or exercising. She did ask for a diet handout today. Her medications were reviewed. She is due for GYN exam this fall as well as her first mammogram   Review Of Systems:  GEN- denies fatigue, fever, weight loss,weakness, recent illness HEENT- denies eye drainage, change in vision, nasal discharge, CVS- denies chest pain, palpitations RESP- denies SOB, cough, wheeze ABD- denies N/V, change in stools, abd pain GU- denies dysuria, hematuria, dribbling, incontinence MSK- denies joint pain, muscle aches, injury Neuro- denies headache, dizziness, syncope, seizure activity       Objective:    BP 126/72  Pulse 76  Temp(Src) 97.7 F (36.5 C) (Oral)  Resp 14  Ht 5' 5"  (1.651 m)  Wt 391 lb (177.356 kg)  BMI 65.07 kg/m2  LMP 04/19/2014 GEN- NAD, alert and oriented x3,obese HEENT- PERRL, EOMI, non injected sclera, pink conjunctiva, MMM, oropharynx  CVS- RRR, no murmur RESP-CTAB EXT- trace edema Pulses- Radial 2+      Assessment & Plan:      Problem List Items Addressed This Visit   Morbid obesity - Primary   Relevant Orders      Comprehensive metabolic panel   Hypertension   Hyperlipidemia   Relevant Orders      Lipid panel      Note: This dictation was prepared with Dragon dictation along with smaller phrase technology. Any transcriptional errors that result from this process are unintentional.

## 2014-04-25 NOTE — Assessment & Plan Note (Signed)
We discussed in detail the importance of low carb low fat diet and continue weight loss. It is difficult to see if she is truly interested in weight loss. I did provide her with 1800-calorie handout

## 2014-04-25 NOTE — Assessment & Plan Note (Signed)
Recheck lipid panel

## 2014-04-26 ENCOUNTER — Encounter: Payer: Self-pay | Admitting: *Deleted

## 2014-05-25 ENCOUNTER — Encounter: Payer: Self-pay | Admitting: Family Medicine

## 2014-05-25 ENCOUNTER — Telehealth: Payer: Self-pay | Admitting: Family Medicine

## 2014-05-25 ENCOUNTER — Ambulatory Visit (INDEPENDENT_AMBULATORY_CARE_PROVIDER_SITE_OTHER): Payer: Medicare Other | Admitting: Family Medicine

## 2014-05-25 VITALS — BP 130/74 | HR 84 | Temp 98.0°F | Resp 18 | Ht 65.0 in | Wt 384.0 lb

## 2014-05-25 DIAGNOSIS — M2420 Disorder of ligament, unspecified site: Secondary | ICD-10-CM

## 2014-05-25 DIAGNOSIS — M629 Disorder of muscle, unspecified: Secondary | ICD-10-CM

## 2014-05-25 DIAGNOSIS — B351 Tinea unguium: Secondary | ICD-10-CM | POA: Diagnosis not present

## 2014-05-25 DIAGNOSIS — B372 Candidiasis of skin and nail: Secondary | ICD-10-CM | POA: Diagnosis not present

## 2014-05-25 DIAGNOSIS — M7631 Iliotibial band syndrome, right leg: Secondary | ICD-10-CM

## 2014-05-25 MED ORDER — CLOTRIMAZOLE-BETAMETHASONE 1-0.05 % EX CREA: 1.0000 "application " | TOPICAL_CREAM | Freq: Two times a day (BID) | CUTANEOUS | Status: DC

## 2014-05-25 NOTE — Telephone Encounter (Signed)
She is supposed to take flexeril???? That's what we discussed.  She can use tramadol 50 q 6 hrs prn pain 10.

## 2014-05-25 NOTE — Telephone Encounter (Signed)
[phone] Patient was here this morning for leg pain would like to know if we can call in pain med for her

## 2014-05-25 NOTE — Progress Notes (Signed)
Subjective:    Patient ID: Jasmin Castro, female    DOB: 1976/07/27, 38 y.o.   MRN: [id number]  HPI  Patient presents with one week of pain over the lateral aspect of her right hip that radiates from her right hip down to the lateral aspect of her right knee along the iliotibial band. It does not progress below the iliotibial band. She has no tenderness to palpation over the greater trochanteric bursa. She has no pain with range of motion in the hip joint itself. She is a negative straight leg raise and no symptoms of lumbar radiculopathy. She's been taking Flexeril which seems to help the pain somewhat. She also has toenail fungus on her left great toenail. She also has an intertriginous rash behind both knees they consist of an erythematous patch with fine white scale. Past Medical History  Diagnosis Date  . Hyperlipemia   . Hypertension   . Depression   . Asthma   . Prediabetes   . Paranoid   . Panic attacks   . PCOS (polycystic ovarian syndrome)   . Obesity   . Urge incontinence 07/19/2013   Past Surgical History  Procedure Laterality Date  . Insulin resistant    . Hysteroscopy diagnostic     Current Outpatient Prescriptions on File Prior to Visit  Medication Sig Dispense Refill  . acetaminophen (TYLENOL) 325 MG tablet Take 650 mg by mouth every 6 (six) hours as needed.      . cyclobenzaprine (FLEXERIL) 10 MG tablet Take 1 tablet (10 mg total) by mouth at bedtime as needed for muscle spasms.  30 tablet  1  . enalapril (VASOTEC) 20 MG tablet TAKE ONE TABLET BY MOUTH DAILY  30 tablet  3  . furosemide (LASIX) 20 MG tablet Take 1 tablet (20 mg total) by mouth daily.  30 tablet  3  . LORazepam (ATIVAN) 1 MG tablet Take 1 mg by mouth at bedtime.       . meclizine (ANTIVERT) 12.5 MG tablet TAKE ONE TABLET BY MOUTH THREE TIMES DAILY AS NEEDED FOR DIZZINESS OR FOR NAUSEA  30 tablet  1  . nystatin ointment (MYCOSTATIN) Apply topically 2 (two) times daily.  30 g  1  . OLANZapine  (ZYPREXA) 15 MG tablet Take 15 mg by mouth at bedtime.      . pantoprazole (PROTONIX) 40 MG tablet Take 1 tablet (40 mg total) by mouth daily.  30 tablet  3  . pravastatin (PRAVACHOL) 20 MG tablet TAKE ONE TABLET BY MOUTH DAILY  30 tablet  6  . PRISTIQ 100 MG 24 hr tablet Take 100 mg by mouth at bedtime.        No current facility-administered medications on file prior to visit.   Allergies  Allergen Reactions  . Lodine [Etodolac] Other (See Comments)    Dizziness   . Seroquel [Quetiapine Fumarate]     Itching    History   Social History  . Marital Status: Single    Spouse Name: N/A    Number of Children: N/A  . Years of Education: N/A   Occupational History  . Not on file.   Social History Main Topics  . Smoking status: Never Smoker   . Smokeless tobacco: Never Used  . Alcohol Use: No  . Drug Use: No  . Sexual Activity: No   Other Topics Concern  . Not on file   Social History Narrative  . No narrative on file     Review of  Systems  All other systems reviewed and are negative.      Objective:   Physical Exam  Vitals reviewed. Constitutional: She appears well-developed and well-nourished.  Cardiovascular: Normal rate and regular rhythm.   Pulmonary/Chest: Effort normal and breath sounds normal.  Musculoskeletal:       Right hip: Normal.       Right knee: Normal.  Skin: Rash noted. There is erythema.   negative straight leg raise.        Assessment & Plan:  Candidal intertrigo - Plan: clotrimazole-betamethasone (LOTRISONE) cream  Patient has Candida intertrigo behind her knees. I have recommended Lotrisone cream be applied twice a day for 10-14 days. Right hip pain is likely a strain the iliotibial band muscle. I see no evidence of lumbar radiculopathy, sciatica, greater trochanteric bursitis. Therefore I recommended Flexeril 10 mg every 8 hours as needed for muscle pain. I recommended he be applied 3 times a day. Recheck in 1 week if no better or  sooner if worse. We discussed using Lamisil for onychomycosis in her left great toenail. The patient defers this medication due to risk of liver toxicity.

## 2014-05-25 NOTE — Telephone Encounter (Signed)
Pt aware and med called to pharm

## 2014-05-28 ENCOUNTER — Ambulatory Visit: Payer: Medicare Other | Admitting: Family Medicine

## 2014-06-04 ENCOUNTER — Ambulatory Visit: Payer: Medicare Other | Admitting: Family Medicine

## 2014-07-12 ENCOUNTER — Ambulatory Visit: Payer: Medicare Other

## 2014-08-16 ENCOUNTER — Other Ambulatory Visit: Payer: Self-pay | Admitting: *Deleted

## 2014-08-16 MED ORDER — ENALAPRIL MALEATE 20 MG PO TABS: ORAL_TABLET | ORAL | Status: DC

## 2014-08-16 NOTE — Telephone Encounter (Signed)
Received fax requesting refill on Vasotec.   Refill appropriate and filled per protocol.

## 2014-08-24 ENCOUNTER — Ambulatory Visit: Payer: Medicare Other | Admitting: Family Medicine

## 2014-09-10 ENCOUNTER — Other Ambulatory Visit: Payer: Self-pay | Admitting: Family Medicine

## 2014-09-10 NOTE — Telephone Encounter (Signed)
Refill appropriate and filled per protocol. 

## 2014-09-11 ENCOUNTER — Ambulatory Visit: Payer: Medicare Other | Admitting: Family Medicine

## 2014-09-12 ENCOUNTER — Encounter: Payer: Self-pay | Admitting: Family Medicine

## 2014-09-12 ENCOUNTER — Ambulatory Visit: Payer: Medicare Other | Admitting: Family Medicine

## 2014-09-12 ENCOUNTER — Ambulatory Visit (INDEPENDENT_AMBULATORY_CARE_PROVIDER_SITE_OTHER): Payer: Medicare Other | Admitting: Family Medicine

## 2014-09-12 VITALS — BP 128/76 | HR 88 | Temp 98.2°F | Resp 16 | Ht 65.0 in | Wt 387.0 lb

## 2014-09-12 DIAGNOSIS — I10 Essential (primary) hypertension: Secondary | ICD-10-CM | POA: Diagnosis not present

## 2014-09-12 DIAGNOSIS — R300 Dysuria: Secondary | ICD-10-CM | POA: Diagnosis not present

## 2014-09-12 DIAGNOSIS — Z23 Encounter for immunization: Secondary | ICD-10-CM

## 2014-09-12 DIAGNOSIS — J029 Acute pharyngitis, unspecified: Secondary | ICD-10-CM | POA: Diagnosis not present

## 2014-09-12 DIAGNOSIS — E785 Hyperlipidemia, unspecified: Secondary | ICD-10-CM

## 2014-09-12 LAB — URINALYSIS, ROUTINE W REFLEX MICROSCOPIC
GLUCOSE, UA: NEGATIVE mg/dL
LEUKOCYTES UA: NEGATIVE
Nitrite: NEGATIVE
PH: 6 (ref 5.0–8.0)
PROTEIN: 100 mg/dL — AB
SPECIFIC GRAVITY, URINE: 1.02 (ref 1.005–1.030)
Urobilinogen, UA: 0.2 mg/dL (ref 0.0–1.0)

## 2014-09-12 LAB — RAPID STREP SCREEN (MED CTR MEBANE ONLY): STREPTOCOCCUS, GROUP A SCREEN (DIRECT): POSITIVE — AB

## 2014-09-12 LAB — URINALYSIS, MICROSCOPIC ONLY
CRYSTALS: NONE SEEN
Casts: NONE SEEN
WBC, UA: NONE SEEN WBC/hpf (ref <3)

## 2014-09-12 MED ORDER — AMOXICILLIN 500 MG PO CAPS: 500.0000 mg | ORAL_CAPSULE | Freq: Three times a day (TID) | ORAL | Status: DC

## 2014-09-12 NOTE — Patient Instructions (Addendum)
Referral to nutritionist Take antibiotics as prescribed Increase water, fruits, veggies Avoid junk food, soda, cookies, candies Continue blood pressure medicine FLu shot given Call and schedule your GYN exam F/U 4 months

## 2014-09-12 NOTE — Assessment & Plan Note (Signed)
i have tried multiple times to discuss healthy eating with her, she has regained weight Will send to nutrionist again, where maybe she can just focus on her eating

## 2014-09-12 NOTE — Assessment & Plan Note (Signed)
Continue pravastatin

## 2014-09-12 NOTE — Progress Notes (Signed)
Patient ID: Jasmin Castro, female   DOB: April 29, 1976, 38 y.o.   MRN: [id number]   Subjective:    Patient ID: Jasmin Castro, female    DOB: April 27, 1976, 38 y.o.   MRN: [id number]  Patient presents for 4 month F/U; Urinalysis; and Sore Throat  patient here for follow-up. Forcefully she has not lost any significant weight and states that she likes eat lots of sweets and car when she gets started she tends to overeat. She is not doing any exercise either.  She complains of sore throat she noticed some white spots in the back of her throat with some of them actually came out to swallow. She feels like her throat is scratchy she's not had any fever no sinus drainage no cough.  She will like her urine to be checked she's had a strong odor to her urine as well as some frequency which was a couple weeks ago. She denies any dysuria she is currently on her menstrual cycle.  Her medications were reviewed and there've been no significant changes. She is overdue for her GYN exam    Review Of Systems: per above   GEN- denies fatigue, fever, weight loss,weakness, recent illness HEENT- denies eye drainage, change in vision, nasal discharge, CVS- denies chest pain, palpitations RESP- denies SOB, cough, wheeze ABD- denies N/V, change in stools, abd pain GU- denies dysuria, hematuria, dribbling, incontinence MSK- + joint pain, muscle aches, injury Neuro- denies headache, dizziness, syncope, seizure activity       Objective:    BP 128/76 mmHg  Pulse 88  Temp(Src) 98.2 F (36.8 C) (Oral)  Resp 16  Ht 5' 5"  (1.651 m)  Wt 387 lb (175.542 kg)  BMI 64.40 kg/m2  LMP 09/07/2014 (Exact Date) GEN- NAD, alert and oriented x3 HEENT- PERRL, EOMI, non injected sclera, pink conjunctiva, MMM, oropharynx +injection, few exudates, TM clear bilat no effusion, no  maxillary sinus tenderness, nares clear  Neck- Supple, shott LAD CVS- RRR, no murmur RESP-CTAB ABD-NABS Soft, NT,ND, large pannus, no CVA  tenderness EXT- No edema Pulses- Radial 2     Assessment & Plan:      Problem List Items Addressed This Visit      Unprioritized   Morbid obesity    i have tried multiple times to discuss healthy eating with her, she has regained weight Will send to nutrionist again, where maybe she can just focus on her eating    Hypertension    Well controlled, no change to meds    Hyperlipidemia    Continue pravastatin     Other Visit Diagnoses    Dysuria    -  Primary    No sign of infection, no current symptoms, increase water, decrease soda, juice    Relevant Orders       Urinalysis, Routine w reflex microscopic (Completed)    Sore throat        + strep pharygitis, treat with antibiotics     Relevant Orders       Rapid Strep Screen (Completed)    Need for prophylactic vaccination and inoculation against influenza        Relevant Orders       Flu Vaccine QUAD 36+ mos PF IM (Fluarix Quad PF) (Completed)       Note: This dictation was prepared with Dragon dictation along with smaller phrase technology. Any transcriptional errors that result from this process are unintentional.

## 2014-09-12 NOTE — Assessment & Plan Note (Signed)
Well controlled, no change to meds 

## 2014-09-24 ENCOUNTER — Telehealth: Payer: Self-pay | Admitting: Family Medicine

## 2014-09-24 DIAGNOSIS — F331 Major depressive disorder, recurrent, moderate: Secondary | ICD-10-CM

## 2014-09-24 DIAGNOSIS — F22 Delusional disorders: Secondary | ICD-10-CM

## 2014-09-24 DIAGNOSIS — F411 Generalized anxiety disorder: Secondary | ICD-10-CM

## 2014-09-24 NOTE — Telephone Encounter (Signed)
Patient was in last week or so and she says she would need something else called in, because he nose is still dripping  [place]

## 2014-09-24 NOTE — Telephone Encounter (Signed)
Call placed to patient.   Reports that she has productive cough with clear mucus, nasal congestion, sneezing and nasal drainage.   Requested MD to advise.   Also reports that she can no longer afford to go to see psychiatrist D/T insurance issues. Requested to know if MD would refill psych medications when she needed them.

## 2014-09-24 NOTE — Telephone Encounter (Signed)
Call placed to patient and patient made aware.  

## 2014-09-24 NOTE — Telephone Encounter (Signed)
She can take OTC cough medication, nasal saline Regarding psychiatry she will need a new psychiatrist I will refill meds until she can be seen, but I do not typically prescribe those medications Okay to place referral  Psychiatry- MDD, GAD, Paranoia

## 2014-09-25 ENCOUNTER — Telehealth: Payer: Self-pay | Admitting: *Deleted

## 2014-09-25 MED ORDER — AZITHROMYCIN 250 MG PO TABS: ORAL_TABLET | ORAL | Status: DC

## 2014-09-25 NOTE — Telephone Encounter (Signed)
Received call from patient.   Reports that she has developed fever and chills, along with her nasal drainage and chest congestion. Reports that mucus has changed to yellowish green color as well.   Also reports that new referral was sent to [facility] in [place]. Reports that she has been seen there in the past and had issues with the therapy. Requested to be referred somewhere else.   MD please advise.

## 2014-09-25 NOTE — Telephone Encounter (Signed)
sabrina please refer to a new psychiatrist, may need to be seen in [place] her to take mucinex DM, send in [place]

## 2014-09-25 NOTE — Telephone Encounter (Signed)
Call placed to patient and patient made aware.   Prescription sent to pharmacy.

## 2014-11-07 ENCOUNTER — Encounter: Payer: Medicare Other | Admitting: Nutrition

## 2014-11-28 ENCOUNTER — Telehealth: Payer: Self-pay | Admitting: Family Medicine

## 2014-11-28 NOTE — Telephone Encounter (Signed)
[phone]  PT states she is needing something sent to [place] saying that she is needing to come in Friday for her apt

## 2014-11-29 NOTE — Telephone Encounter (Signed)
Received call from patient.   States that she needs letter from doctor stating that she needs to come in tomorrow. Advised that MD is not in office at this time and patient is responsible for transportation.   Advised that [facility] must have 5 days advanced notice before any appointment.

## 2014-11-30 ENCOUNTER — Ambulatory Visit (INDEPENDENT_AMBULATORY_CARE_PROVIDER_SITE_OTHER): Payer: Medicare Other | Admitting: Family Medicine

## 2014-11-30 ENCOUNTER — Encounter: Payer: Self-pay | Admitting: Family Medicine

## 2014-11-30 VITALS — BP 130/76 | HR 78 | Temp 97.8°F | Resp 16 | Ht 65.0 in | Wt 384.0 lb

## 2014-11-30 DIAGNOSIS — J069 Acute upper respiratory infection, unspecified: Secondary | ICD-10-CM | POA: Diagnosis not present

## 2014-11-30 MED ORDER — AZITHROMYCIN 250 MG PO TABS: ORAL_TABLET | ORAL | Status: DC

## 2014-11-30 MED ORDER — GUAIFENESIN-CODEINE 100-10 MG/5ML PO SOLN: 10.0000 mL | Freq: Four times a day (QID) | ORAL | Status: DC | PRN

## 2014-11-30 NOTE — Patient Instructions (Signed)
Start antibiotics if not improved by Sunday Take cough medicine with codiene, if you have need to drive then take the robitussin F/U as previous

## 2014-11-30 NOTE — Progress Notes (Signed)
Patient ID: Jasmin Castro, female   DOB: 03-05-1976, 39 y.o.   MRN: [id number]   Subjective:    Patient ID: Jasmin Castro, female    DOB: 09/02/1976, 40 y.o.   MRN: [id number]  Patient presents for Illness  patient here with productive cough sore throat , subjective fever and chills nasal congestion for the past 5 days. Positive sick contact with her brother. She has been taking some Robitussin as well as Zyrtec. She denies any shortness of breath no wheezing. She's had mild nasal drainage, no sinus headaches  Review Of Systems:  GEN- denies fatigue, +fever, weight loss,weakness, recent illness HEENT- denies eye drainage, change in vision,+ nasal discharge, CVS- denies chest pain, palpitations RESP- denies SOB, +cough, wheeze ABD- denies N/V, change in stools, abd pain Neuro- denies headache, dizziness, syncope, seizure activity       Objective:    BP 130/76 mmHg  Pulse 78  Temp(Src) 97.8 F (36.6 C) (Oral)  Resp 16  Ht 5' 5"  (1.651 m)  Wt 384 lb (174.181 kg)  BMI 63.90 kg/m2  LMP 11/29/2014 (Approximate) GEN- NAD, alert and oriented x3 HEENT- PERRL, EOMI, non injected sclera, pink conjunctiva, MMM, oropharynx mild injection, TM clear bilat no effusion,  + mild maxillary sinus tenderness, +  Nasal drainage  Neck- Supple, no LAD CVS- RRR, no murmur RESP-CTAB Pulses- Radial 2+         Assessment & Plan:      Problem List Items Addressed This Visit    None    Visit Diagnoses    Acute URI    -  Primary    More viral symptoms, at this point but illness is prolonging, given zpak to start Sunday if not better, robitussin with codiene for the cough    Relevant Medications    azithromycin (ZITHROMAX) tablet       Note: This dictation was prepared with Dragon dictation along with smaller phrase technology. Any transcriptional errors that result from this process are unintentional.

## 2014-12-21 ENCOUNTER — Encounter: Payer: Self-pay | Admitting: Nutrition

## 2014-12-21 ENCOUNTER — Encounter: Payer: Medicare Other | Attending: Family Medicine | Admitting: Nutrition

## 2014-12-21 VITALS — Ht 65.0 in | Wt 387.0 lb

## 2014-12-21 DIAGNOSIS — E668 Other obesity: Secondary | ICD-10-CM

## 2014-12-21 DIAGNOSIS — Z713 Dietary counseling and surveillance: Secondary | ICD-10-CM | POA: Diagnosis not present

## 2014-12-21 DIAGNOSIS — Z6841 Body Mass Index (BMI) 40.0 and over, adult: Secondary | ICD-10-CM | POA: Diagnosis not present

## 2014-12-21 NOTE — Patient Instructions (Signed)
Goals 1. Follow the Plate Method. 2. Avoid snacks 3. Cut out diet sodas and only drink water. 4. Walk 30 minutes daily. 5. Eat meals on time. 6. Cut out sweet, cakes, cookies and junk food. 7. Lose 1-2 lbs per week. 8. Use Egg beater due to high cholesterol

## 2014-12-21 NOTE — Progress Notes (Signed)
  Medical Nutrition Therapy:  Appt start time: 0930 end time:  1030.  Assessment:  Primary concerns today: Prediabetes and obesity. LIves with her parents. She does all the cooking and shopping. Most weight she ever weighed  was 418 lbs. Admits to having a problem with eating a lot of sweets and sugar.  Eats 3 meals per day plus snacks.  Physical activity: cleans the house. Willing to start walking daily.   Most recent A1C 5.4%. Alkaline Phosphatase elevated at 135 u/L. Morbid obesity BMI 64.On Pravastatin for history of elevated lipids. LDL elevated slightly.  Being treated for depression. Depression meds causing additional craving for sweets ???  Diet is insuffient in all foods groups and excessive in carbs,sodium, fat and concentrated sweeets. Not drinking much water. Not getting in any exercise but willing to start walking daily.     Preferred Learning Style:   Auditory  Visual  Hands on   Learning Readiness:   Ready  Change in progress  MEDICATIONS:   DIETARY INTAKE:   24-hr recall:  B ( AM): Fried egg and whole toast 1 slices, Diet soda  Snk ( AM): Fish-fried 3 oz,  L ( PM): 4 slices pizza and 4 packs of nabs, Diet soda Snk ( PM): none D ( PM): 2 piece of bbq chicken, green beans, 1/2 c corn and 1 slice of sweet potato pie, water Snk ( PM): none Beverages: Diet Soda(CF Diet coke)  Usual physical activity: clean house but not exercising.  Estimated energy needs: 1500 calories 170 g carbohydrates 112 g protein 42 g fat  Progress Towards Goal(s):  In progress.   Nutritional Diagnosis:  NB-1.1 Food and nutrition-related knowledge deficit As related to morbid obesity.  As evidenced by BMI 64.    Intervention:  Nutrition counseling for heart healthy eating and weight loss and prevention of diabetes. Discussed MY Plate, portion sizes, avoiding sweets, cakes, cookies, desserts and concentrated sweets, benefits of exercise and a low fat high fiber low sodium diet.  Encouraged balanced meals at set times during the day, avoiding snacks and avoiding diet sodas and only drinking water.   Goals 1. Follow the Plate Method. 2. Avoid snacks 3. Cut out diet sodas and only drink water. 4. Walk 30 minutes daily. 5. Eat meals on time. 6. Cut out sweet, cakes, cookies and junk food. 7. Lose 1-2 lbs per week. 8. Use Egg beater due to high cholesterol Keep food journal and bring at each visit.   Teaching Method Utilized: none Visual Auditory Hands on  Handouts given during visit include:  The Plate Method  The Meal Plan Card  Barriers to learning/adherence to lifestyle change: none  Demonstrated degree of understanding via:  Teach Back   Monitoring/Evaluation:  Dietary intake, exercise, meal planning, food journal, and body weight in 1 month(s).

## 2015-01-01 ENCOUNTER — Other Ambulatory Visit: Payer: Self-pay | Admitting: Family Medicine

## 2015-01-02 NOTE — Telephone Encounter (Signed)
Refill appropriate and filled per protocol. 

## 2015-01-09 ENCOUNTER — Other Ambulatory Visit: Payer: Self-pay | Admitting: Family Medicine

## 2015-01-09 NOTE — Telephone Encounter (Signed)
Refill appropriate and filled per protocol. 

## 2015-01-14 ENCOUNTER — Ambulatory Visit (INDEPENDENT_AMBULATORY_CARE_PROVIDER_SITE_OTHER): Payer: Medicare Other | Admitting: Family Medicine

## 2015-01-14 ENCOUNTER — Encounter: Payer: Self-pay | Admitting: Family Medicine

## 2015-01-14 VITALS — BP 130/68 | HR 78 | Temp 98.3°F | Resp 16 | Ht 65.0 in | Wt 382.0 lb

## 2015-01-14 DIAGNOSIS — F411 Generalized anxiety disorder: Secondary | ICD-10-CM | POA: Diagnosis not present

## 2015-01-14 DIAGNOSIS — E785 Hyperlipidemia, unspecified: Secondary | ICD-10-CM | POA: Diagnosis not present

## 2015-01-14 DIAGNOSIS — B372 Candidiasis of skin and nail: Secondary | ICD-10-CM

## 2015-01-14 DIAGNOSIS — R7309 Other abnormal glucose: Secondary | ICD-10-CM | POA: Diagnosis not present

## 2015-01-14 DIAGNOSIS — I10 Essential (primary) hypertension: Secondary | ICD-10-CM | POA: Diagnosis not present

## 2015-01-14 DIAGNOSIS — R7303 Prediabetes: Secondary | ICD-10-CM

## 2015-01-14 LAB — LIPID PANEL
Cholesterol: 151 mg/dL (ref 0–200)
HDL: 44 mg/dL — AB (ref >=46)
LDL Cholesterol: 96 mg/dL (ref 0–99)
TRIGLYCERIDES: 53 mg/dL (ref <150)
Total CHOL/HDL Ratio: 3.4 Ratio
VLDL: 11 mg/dL (ref 0–40)

## 2015-01-14 LAB — COMPREHENSIVE METABOLIC PANEL
ALT: 12 U/L (ref 0–35)
AST: 15 U/L (ref 0–37)
Albumin: 3.7 g/dL (ref 3.5–5.2)
Alkaline Phosphatase: 128 U/L — ABNORMAL HIGH (ref 39–117)
BUN: 7 mg/dL (ref 6–23)
CHLORIDE: 106 meq/L (ref 96–112)
CO2: 23 meq/L (ref 19–32)
Calcium: 9.1 mg/dL (ref 8.4–10.5)
Creat: 0.55 mg/dL (ref 0.50–1.10)
Glucose, Bld: 91 mg/dL (ref 70–99)
Potassium: 4.2 mEq/L (ref 3.5–5.3)
Sodium: 140 mEq/L (ref 135–145)
Total Bilirubin: 0.4 mg/dL (ref 0.2–1.2)
Total Protein: 6.9 g/dL (ref 6.0–8.3)

## 2015-01-14 LAB — HEMOGLOBIN A1C
HEMOGLOBIN A1C: 5.5 % (ref <5.7)
Mean Plasma Glucose: 111 mg/dL (ref <117)

## 2015-01-14 MED ORDER — NYSTATIN 100000 UNIT/GM EX POWD: CUTANEOUS | Status: DC

## 2015-01-14 MED ORDER — CLOTRIMAZOLE-BETAMETHASONE 1-0.05 % EX CREA: 1.0000 "application " | TOPICAL_CREAM | Freq: Two times a day (BID) | CUTANEOUS | Status: DC

## 2015-01-14 NOTE — Progress Notes (Signed)
Patient ID: Jasmin Castro, female   DOB: 01-08-1976, 39 y.o.   MRN: [id number]   Subjective:    Patient ID: Jasmin Castro, female    DOB: [birth year], 39 y.o.   MRN: [id number]  Patient presents for 4 month F/U  patient to follow-up chronic medical problems. She was seen by the nutritionist last month she has lost 5 pounds since then. She is concerned she will not be with the follow the diet because she was told she needs eats only 3 meals a day and no snacks and she gets very hungry between meals. She was also concerned about drinking diet soda as I advised that she could drink one a day. She is due for fasting labs today she also requested a refill on the cream that she uses for Candida intertrigo. He does tell me she has a new psychiatrist in the first visit went well however she does not prescribe benzodiazepines. She does take lorazepam but very rarely and asked if I could prescribe this for her if needed    Review Of Systems:  GEN- denies fatigue, fever, weight loss,weakness, recent illness HEENT- denies eye drainage, change in vision, nasal discharge, CVS- denies chest pain, palpitations RESP- denies SOB, cough, wheeze ABD- denies N/V, change in stools, abd pain GU- denies dysuria, hematuria, dribbling, incontinence MSK- +joint pain, muscle aches, injury Neuro- denies headache, dizziness, syncope, seizure activity       Objective:    BP 130/68 mmHg  Pulse 78  Temp(Src) 98.3 F (36.8 C) (Oral)  Resp 16  Ht 5' 5"  (1.651 m)  Wt 382 lb (173.274 kg)  BMI 63.57 kg/m2  LMP 12/17/2014 (Approximate) GEN- NAD, alert and oriented x3,obese HEENT- PERRL, EOMI, non injected sclera, pink conjunctiva, MMM, oropharynx clear Neck- Supple, no thyromegaly CVS- RRR, no murmur RESP-CTAB ABD-NABS,soft,NT,ND EXT-trace edema Pulses- Radial  2+        Assessment & Plan:      Problem List Items Addressed This Visit    Prediabetes   Relevant Orders   Hemoglobin A1c   Morbid obesity     Discussed dietary changes,, advised she can eat a healthy snack, greek yogurt, or veggies, no nabs/crackers, other packaged goods No koolaid, no tea, only 1 diet soda a day the rest all water She seemed to feels if she could not modify the diet given by nutritionist she would not follow up with her.She has lost 5lbs and any changes to her diet, will be worthwhile Also discussed walking even short distances or exercise in pool      Hypertension    Well controlled      Relevant Orders   CBC with Differential/Platelet   Hyperlipidemia - Primary    Recheck lipids , continue statin drug      Relevant Orders   Comprehensive metabolic panel   Lipid panel   GAD (generalized anxiety disorder)    Advised pt I will fill ativan if needed, rare use       Other Visit Diagnoses    Candidal intertrigo        given nystatin powder, and lotrisone if needed    Relevant Medications    clotrimazole-betamethasone (LOTRISONE) cream    nystatin (MYCOSTATIN/NYSTOP) 100000 UNIT/GM POWD       Note: This dictation was prepared with Dragon dictation along with smaller phrase technology. Any transcriptional errors that result from this process are unintentional.

## 2015-01-14 NOTE — Assessment & Plan Note (Signed)
Well controlled 

## 2015-01-14 NOTE — Assessment & Plan Note (Signed)
Recheck lipids , continue statin drug

## 2015-01-14 NOTE — Patient Instructions (Addendum)
We will call with lab results Continue with nutritionist Snacks- [place] Yogurt, veggies- carrots, celery, broccoli, green beans, almond butter F/U 4 months

## 2015-01-14 NOTE — Assessment & Plan Note (Signed)
Advised pt I will fill ativan if needed, rare use

## 2015-01-14 NOTE — Assessment & Plan Note (Signed)
Discussed dietary changes,, advised she can eat a healthy snack, greek yogurt, or veggies, no nabs/crackers, other packaged goods No koolaid, no tea, only 1 diet soda a day the rest all water She seemed to feels if she could not modify the diet given by nutritionist she would not follow up with her.She has lost 5lbs and any changes to her diet, will be worthwhile Also discussed walking even short distances or exercise in pool

## 2015-01-15 LAB — CBC WITH DIFFERENTIAL/PLATELET
Basophils Absolute: 0 10*3/uL (ref 0.0–0.1)
Basophils Relative: 0 % (ref 0–1)
Eosinophils Absolute: 0 10*3/uL (ref 0.0–0.7)
Eosinophils Relative: 0 % (ref 0–5)
HCT: 37.4 % (ref 36.0–46.0)
HEMOGLOBIN: 11.7 g/dL — AB (ref 12.0–15.0)
LYMPHS PCT: 27 % (ref 12–46)
Lymphs Abs: 2.7 10*3/uL (ref 0.7–4.0)
MCH: 26.2 pg (ref 26.0–34.0)
MCHC: 31.3 g/dL (ref 30.0–36.0)
MCV: 83.7 fL (ref 78.0–100.0)
MONOS PCT: 7 % (ref 3–12)
MPV: 9.6 fL (ref 8.6–12.4)
Monocytes Absolute: 0.7 10*3/uL (ref 0.1–1.0)
Neutro Abs: 6.5 10*3/uL (ref 1.7–7.7)
Neutrophils Relative %: 66 % (ref 43–77)
PLATELETS: 430 10*3/uL — AB (ref 150–400)
RBC: 4.47 MIL/uL (ref 3.87–5.11)
RDW: 15.3 % (ref 11.5–15.5)
WBC: 9.9 10*3/uL (ref 4.0–10.5)

## 2015-01-17 NOTE — Progress Notes (Signed)
She called to cancel appointment and doesn't want to reschedule appointment.

## 2015-01-30 ENCOUNTER — Ambulatory Visit: Payer: Medicare Other | Admitting: Nutrition

## 2015-02-18 ENCOUNTER — Ambulatory Visit: Payer: Medicare Other | Admitting: Nutrition

## 2015-03-06 ENCOUNTER — Ambulatory Visit (INDEPENDENT_AMBULATORY_CARE_PROVIDER_SITE_OTHER): Payer: Medicare Other | Admitting: Family Medicine

## 2015-03-06 ENCOUNTER — Encounter: Payer: Self-pay | Admitting: Family Medicine

## 2015-03-06 VITALS — BP 132/74 | HR 70 | Temp 97.5°F | Resp 16 | Ht 65.0 in | Wt 378.0 lb

## 2015-03-06 DIAGNOSIS — R197 Diarrhea, unspecified: Secondary | ICD-10-CM

## 2015-03-06 LAB — CBC WITH DIFFERENTIAL/PLATELET
BASOS ABS: 0 10*3/uL (ref 0.0–0.1)
BASOS PCT: 0 % (ref 0–1)
EOS ABS: 0.1 10*3/uL (ref 0.0–0.7)
Eosinophils Relative: 1 % (ref 0–5)
HCT: 35.7 % — ABNORMAL LOW (ref 36.0–46.0)
Hemoglobin: 11.1 g/dL — ABNORMAL LOW (ref 12.0–15.0)
Lymphocytes Relative: 28 % (ref 12–46)
Lymphs Abs: 2.3 10*3/uL (ref 0.7–4.0)
MCH: 25.7 pg — AB (ref 26.0–34.0)
MCHC: 31.1 g/dL (ref 30.0–36.0)
MCV: 82.6 fL (ref 78.0–100.0)
MONO ABS: 0.8 10*3/uL (ref 0.1–1.0)
MPV: 9.6 fL (ref 8.6–12.4)
Monocytes Relative: 10 % (ref 3–12)
Neutro Abs: 5 10*3/uL (ref 1.7–7.7)
Neutrophils Relative %: 61 % (ref 43–77)
Platelets: 420 10*3/uL — ABNORMAL HIGH (ref 150–400)
RBC: 4.32 MIL/uL (ref 3.87–5.11)
RDW: 15.4 % (ref 11.5–15.5)
WBC: 8.2 10*3/uL (ref 4.0–10.5)

## 2015-03-06 LAB — COMPREHENSIVE METABOLIC PANEL
ALK PHOS: 128 U/L — AB (ref 39–117)
ALT: 13 U/L (ref 0–35)
AST: 16 U/L (ref 0–37)
Albumin: 3.8 g/dL (ref 3.5–5.2)
BILIRUBIN TOTAL: 0.5 mg/dL (ref 0.2–1.2)
BUN: 6 mg/dL (ref 6–23)
CO2: 25 mEq/L (ref 19–32)
CREATININE: 0.58 mg/dL (ref 0.50–1.10)
Calcium: 9.1 mg/dL (ref 8.4–10.5)
Chloride: 105 mEq/L (ref 96–112)
Glucose, Bld: 99 mg/dL (ref 70–99)
Potassium: 4.2 mEq/L (ref 3.5–5.3)
SODIUM: 140 meq/L (ref 135–145)
TOTAL PROTEIN: 6.7 g/dL (ref 6.0–8.3)

## 2015-03-06 NOTE — Patient Instructions (Addendum)
Stool culture and C diff, bring in sample  CT scan to be done if this continues  BMET, CBC w diff  F/U as previous

## 2015-03-07 NOTE — Progress Notes (Signed)
Patient ID: Jasmin Castro, female   DOB: 1976/02/14, 39 y.o.   MRN: [id number]   Subjective:    Patient ID: Jasmin Castro, female    DOB: Sep 07, 1976, 39 y.o.   MRN: [id number]  Patient presents for GI Upset  Pt here with abdominal pain on and off, diarrhea, started after she ate some ground beef, mother with similar symptoms but now has been on and off for a month. No fever, no blood in stools, has cramping after she eats, no new medications. Took some pepto bismol. Typically has 4 loose stools in  Day, none for past 2 days  She also noted she had some sore throat and had a tonsil stone come up, this has resolved    Review Of Systems:  GEN- denies fatigue, fever, weight loss,weakness, recent illness HEENT- denies eye drainage, change in vision, nasal discharge, CVS- denies chest pain, palpitations RESP- denies SOB, cough, wheeze ABD- denies N/V, +change in stools, +abd pain GU- denies dysuria, hematuria, dribbling, incontinence MSK- denies joint pain, muscle aches, injury Neuro- denies headache, dizziness, syncope, seizure activity       Objective:    BP 132/74 mmHg  Pulse 70  Temp(Src) 97.5 F (36.4 C) (Oral)  Resp 16  Ht 5' 5"  (1.651 m)  Wt 378 lb (171.46 kg)  BMI 62.90 kg/m2  LMP 02/13/2015 (Approximate) GEN- NAD, alert and oriented x3.morbodly obese, well appearing HEENT- PERRL, EOMI, non injected sclera, pink conjunctiva, MMM, oropharynx clear CVS- RRR, no murmur RESP-CTAB ABD-NABS,soft, NT, ND  EXT- No edema Pulses- Radial 2+        Assessment & Plan:      Problem List Items Addressed This Visit    None    Visit Diagnoses    Diarrhea    -  Primary    ? related to food poisoning but now 1 month out and continued intermittant symptoms, gallbladder etiology also possibility, obtain labs, would have to image as habitus makes it difficult to get a good exam. At end of visit she staters mother still with a lot of diarrhea and is having her stool tested, I will  go ahead and get stool cultures as well. CT scan will be next step , currently without any diarrhea or active pain    Relevant Orders    Fecal leukocytes    Stool culture    Clostridium Difficile by PCR    CBC with Differential/Platelet (Completed)    Comprehensive metabolic panel (Completed)    Fecal occult blood, imunochemical    Fecal occult blood, imunochemical    Fecal occult blood, imunochemical       Note: This dictation was prepared with Dragon dictation along with smaller phrase technology. Any transcriptional errors that result from this process are unintentional.

## 2015-03-11 DIAGNOSIS — R197 Diarrhea, unspecified: Secondary | ICD-10-CM | POA: Diagnosis not present

## 2015-03-11 NOTE — Addendum Note (Signed)
Addended by: Haskel Khan A on: 03/11/2015 08:41 AM   Modules accepted: Orders

## 2015-03-12 LAB — FECAL LACTOFERRIN, QUANT: Lactoferrin: NEGATIVE

## 2015-03-12 LAB — FECAL OCCULT BLOOD, IMMUNOCHEMICAL
Fecal Occult Blood: NEGATIVE
Fecal Occult Blood: NEGATIVE
Fecal Occult Blood: NEGATIVE

## 2015-03-12 LAB — CLOSTRIDIUM DIFFICILE BY PCR: Toxigenic C. Difficile by PCR: NOT DETECTED

## 2015-03-15 LAB — STOOL CULTURE

## 2015-04-09 ENCOUNTER — Other Ambulatory Visit: Payer: Self-pay | Admitting: Family Medicine

## 2015-04-09 NOTE — Telephone Encounter (Signed)
Medication refilled per protocol. 

## 2015-05-02 ENCOUNTER — Other Ambulatory Visit: Payer: Self-pay | Admitting: Family Medicine

## 2015-05-02 NOTE — Telephone Encounter (Signed)
Refill appropriate and filled per protocol. 

## 2015-05-15 ENCOUNTER — Other Ambulatory Visit: Payer: Medicare Other | Admitting: Advanced Practice Midwife

## 2015-05-21 ENCOUNTER — Ambulatory Visit (INDEPENDENT_AMBULATORY_CARE_PROVIDER_SITE_OTHER): Payer: Medicare Other | Admitting: Family Medicine

## 2015-05-21 ENCOUNTER — Encounter: Payer: Self-pay | Admitting: Family Medicine

## 2015-05-21 VITALS — BP 130/76 | HR 78 | Temp 98.1°F | Resp 14 | Ht 65.0 in | Wt 378.0 lb

## 2015-05-21 DIAGNOSIS — I10 Essential (primary) hypertension: Secondary | ICD-10-CM

## 2015-05-21 DIAGNOSIS — R7303 Prediabetes: Secondary | ICD-10-CM

## 2015-05-21 DIAGNOSIS — E785 Hyperlipidemia, unspecified: Secondary | ICD-10-CM

## 2015-05-21 DIAGNOSIS — E8881 Metabolic syndrome: Secondary | ICD-10-CM | POA: Insufficient documentation

## 2015-05-21 DIAGNOSIS — R7309 Other abnormal glucose: Secondary | ICD-10-CM

## 2015-05-21 DIAGNOSIS — Z23 Encounter for immunization: Secondary | ICD-10-CM

## 2015-05-21 NOTE — Assessment & Plan Note (Signed)
Spent significant time discussing importance of changing her diet and weight loss. With the exception of her psychiatric history she likely would be a good candidate for weight loss surgery though she does not want to pursue this. She seems to have very little motivation for losing weight. I've given her handouts for the local hospital where they have diabetic and nutrition classes for free in the afternoons. But she would do best if she was one-on-one with the nutritionist.

## 2015-05-21 NOTE — Assessment & Plan Note (Signed)
Blood pressure is well controlled no change to enalapril

## 2015-05-21 NOTE — Progress Notes (Signed)
Patient ID: Jasmin Castro, female   DOB: 1976/05/20, 39 y.o.   MRN: [id number]   Subjective:    Patient ID: Jasmin Castro, female    DOB: May 26, 1976, 39 y.o.   MRN: [id number]  Patient presents for 4 month F/U  patient here to follow her chronic medical problems. She has no particular concerns today. Her bowels are back to baseline from her previous visit where she had diarrhea. She is still following with her psychiatrist there've been no medication changes. She is taking her blood pressure medicine and her cholesterol medicine as prescribed. She's not been back to the nutritionist because of some insurance problems which she is getting worked out. Her weight is the same from her visit in June at 378 pounds. She has very little activity.    Review Of Systems:  GEN- denies fatigue, fever, weight loss,weakness, recent illness HEENT- denies eye drainage, change in vision, nasal discharge, CVS- denies chest pain, palpitations RESP- denies SOB, cough, wheeze ABD- denies N/V, change in stools, abd pain GU- denies dysuria, hematuria, dribbling, incontinence MSK- +joint pain, muscle aches, injury Neuro- denies headache, dizziness, syncope, seizure activity       Objective:    BP 130/76 mmHg  Pulse 78  Temp(Src) 98.1 F (36.7 C) (Oral)  Resp 14  Ht 5' 5"  (1.651 m)  Wt 378 lb (171.46 kg)  BMI 62.90 kg/m2  LMP 05/14/2015 (Approximate) GEN- NAD, alert and oriented x3, morbidly obese  HEENT- PERRL, EOMI, non injected sclera, pink conjunctiva, MMM, oropharynx clear Neck- Supple, no thyromegaly CVS- RRR, no murmur RESP-CTAB EXT- No edema Pulses- Radial  2+        Assessment & Plan:      Problem List Items Addressed This Visit    Prediabetes   Morbid obesity - Primary   Hypertension      Note: This dictation was prepared with Dragon dictation along with smaller phrase technology. Any transcriptional errors that result from this process are unintentional.

## 2015-05-21 NOTE — Patient Instructions (Signed)
Okay to drink shake for breakfast  Okay to eat veggies only for snacks if you are hungry  Look into the classes at [facility] by next visit  FLu shot due  F/U 4 months

## 2015-05-21 NOTE — Assessment & Plan Note (Signed)
Her last A1c was 5.5% she is not diabetic however she is at significantly high risk and she does have metabolic syndrome

## 2015-05-21 NOTE — Assessment & Plan Note (Signed)
Cholesterol is at goal no change to pravastatin

## 2015-05-29 ENCOUNTER — Encounter: Payer: Self-pay | Admitting: Advanced Practice Midwife

## 2015-05-29 ENCOUNTER — Ambulatory Visit (INDEPENDENT_AMBULATORY_CARE_PROVIDER_SITE_OTHER): Payer: Medicare Other | Admitting: Advanced Practice Midwife

## 2015-05-29 VITALS — BP 120/70 | HR 76 | Ht 65.0 in | Wt 376.2 lb

## 2015-05-29 DIAGNOSIS — Z01419 Encounter for gynecological examination (general) (routine) without abnormal findings: Secondary | ICD-10-CM | POA: Diagnosis not present

## 2015-05-29 DIAGNOSIS — Z0000 Encounter for general adult medical examination without abnormal findings: Secondary | ICD-10-CM | POA: Diagnosis not present

## 2015-05-29 DIAGNOSIS — N3941 Urge incontinence: Secondary | ICD-10-CM

## 2015-05-29 MED ORDER — OXYBUTYNIN CHLORIDE 5 MG PO TABS: 5.0000 mg | ORAL_TABLET | Freq: Two times a day (BID) | ORAL | Status: DC

## 2015-05-29 NOTE — Progress Notes (Signed)
Jasmin Castro 39 y.o.  Filed Vitals:   05/29/15 0909  BP: 120/70  Pulse: 76     Past Medical History: Past Medical History  Diagnosis Date  . Hyperlipemia   . Hypertension   . Depression   . Asthma   . Prediabetes   . Paranoid   . Panic attacks   . PCOS (polycystic ovarian syndrome)   . Obesity   . Urge incontinence 07/19/2013    Past Surgical History: Past Surgical History  Procedure Laterality Date  . Insulin resistant    . Hysteroscopy diagnostic      Family History: Family History  Problem Relation Age of Onset  . Hyperlipidemia Mother   . Hyperlipidemia Father   . Cancer Father     colon and prostate , brain tumor  . Diabetes Sister     prediatic  . Hyperlipidemia Brother   . Hypertension Brother     Social History: Social History  Substance Use Topics  . Smoking status: Never Smoker   . Smokeless tobacco: Never Used  . Alcohol Use: No    Allergies:  Allergies  Allergen Reactions  . Lodine [Etodolac] Other (See Comments)    Dizziness   . Seroquel [Quetiapine Fumarate]     Itching       Current outpatient prescriptions:  .  enalapril (VASOTEC) 20 MG tablet, TAKE ONE TABLET BY MOUTH DAILY, Disp: 30 tablet, Rfl: 3 .  LORazepam (ATIVAN) 1 MG tablet, Take 1 mg by mouth at bedtime. , Disp: , Rfl:  .  OLANZapine (ZYPREXA) 15 MG tablet, Take 15 mg by mouth at bedtime., Disp: , Rfl:  .  pravastatin (PRAVACHOL) 20 MG tablet, TAKE ONE TABLET BY MOUTH ONCE DAILY, Disp: 90 tablet, Rfl: 1 .  PRISTIQ 100 MG 24 hr tablet, Take 100 mg by mouth at bedtime. , Disp: , Rfl:  .  acetaminophen (TYLENOL) 325 MG tablet, Take 650 mg by mouth every 6 (six) hours as needed., Disp: , Rfl:   History of Present Illness: Here for gyn physical.  Last pap 07/2013:  Normal with neg HPV.  Sees Dr Buelah Manis (PCP) who manages HTN, cholesterol, etc.  Not sexually active now.  Has monthly periods, not on any birth control.  States has cramps the first few days of menses.     Review of Systems   Patient denies any headaches, blurred vision, shortness of breath, chest pain, abdominal pain, problems with bowel movements.  C/O urge incontinence.  Denies stress incontinnece. Doesn't have to get up to urinate at night.    Physical Exam: General:  Well developed, well nourished, no acute distress Skin:  Warm and dry Neck:  Midline trachea, normal thyroid Lungs; Clear to auscultation bilaterally Breast:  No dominant palpable mass, retraction, or nipple discharge Cardiovascular: Regular rate and rhythm Abdomen:  Soft, non tender, no hepatosplenomegaly Pelvic:  External genitalia is normal in appearance.  The vagina is normal in appearance.  The cervix is bulbous.  Uterus is felt to be normal size, shape, and contour.  No adnexal masses or tenderness noted. Exam limited by body habitus.  Extremities:  No swelling or varicosities noted Psych:  No mood changes.     Impression: Normal GYN exam Urge incontinence    Plan: Pap 07/2016.  Get mammogram Ibuprofen (steady state) 24 hours before period.  Rx Oxybutin 68m BID (insurance only covers this).  Allow 6 weeks for full effect.  SE discussed.

## 2015-05-29 NOTE — Patient Instructions (Signed)
Take ibuprofen at regular intervals, starting 24 hours before your period.  Take either 646m every 6 hours or 800 mg every 8 hours.

## 2015-06-05 ENCOUNTER — Other Ambulatory Visit: Payer: Self-pay | Admitting: Nurse Practitioner

## 2015-06-05 ENCOUNTER — Other Ambulatory Visit: Payer: Self-pay | Admitting: Family Medicine

## 2015-07-09 ENCOUNTER — Other Ambulatory Visit: Payer: Self-pay | Admitting: Family Medicine

## 2015-07-09 NOTE — Telephone Encounter (Signed)
Refill appropriate and filled per protocol. 

## 2015-07-16 ENCOUNTER — Telehealth: Payer: Self-pay | Admitting: Family Medicine

## 2015-07-16 MED ORDER — ALBUTEROL SULFATE HFA 108 (90 BASE) MCG/ACT IN AERS: 2.0000 | INHALATION_SPRAY | Freq: Four times a day (QID) | RESPIRATORY_TRACT | Status: DC | PRN

## 2015-07-16 NOTE — Telephone Encounter (Signed)
PHARMACY: [facility]   MEDICATION: PRO AIR INHALER   QTY:    SIG:    PHYSICIAN: [place]   PT. PHONE #: [phone]

## 2015-07-16 NOTE — Telephone Encounter (Signed)
Prescription sent to pharmacy.

## 2015-07-23 ENCOUNTER — Ambulatory Visit (INDEPENDENT_AMBULATORY_CARE_PROVIDER_SITE_OTHER): Payer: Medicare Other | Admitting: Family Medicine

## 2015-07-23 ENCOUNTER — Encounter: Payer: Self-pay | Admitting: Family Medicine

## 2015-07-23 VITALS — BP 126/72 | HR 88 | Temp 98.1°F | Resp 16 | Ht 65.0 in | Wt 375.0 lb

## 2015-07-23 DIAGNOSIS — N390 Urinary tract infection, site not specified: Secondary | ICD-10-CM

## 2015-07-23 DIAGNOSIS — R300 Dysuria: Secondary | ICD-10-CM | POA: Diagnosis not present

## 2015-07-23 DIAGNOSIS — J069 Acute upper respiratory infection, unspecified: Secondary | ICD-10-CM | POA: Diagnosis not present

## 2015-07-23 LAB — URINALYSIS, ROUTINE W REFLEX MICROSCOPIC
Bilirubin Urine: NEGATIVE
GLUCOSE, UA: NEGATIVE
Ketones, ur: NEGATIVE
Nitrite: NEGATIVE
PROTEIN: NEGATIVE
Specific Gravity, Urine: 1.015 (ref 1.001–1.035)
pH: 6 (ref 5.0–8.0)

## 2015-07-23 LAB — URINALYSIS, MICROSCOPIC ONLY
Casts: NONE SEEN [LPF]
Crystals: NONE SEEN [HPF]
YEAST: NONE SEEN [HPF]

## 2015-07-23 MED ORDER — FLUCONAZOLE 150 MG PO TABS: 150.0000 mg | ORAL_TABLET | Freq: Once | ORAL | Status: DC

## 2015-07-23 MED ORDER — CEPHALEXIN 500 MG PO CAPS: 500.0000 mg | ORAL_CAPSULE | Freq: Four times a day (QID) | ORAL | Status: DC

## 2015-07-23 NOTE — Progress Notes (Signed)
Patient ID: Jasmin Castro, female   DOB: August 28, 1976, 39 y.o.   MRN: [id number]   Subjective:    Patient ID: Jasmin Castro, female    DOB: [birth year], 39 y.o.   MRN: [id number]  Patient presents for Illness; GI Upset; and Dysuria  here with multiple symptoms. She has had cough with production felt a little short of breath at night for the past few days. She's also had diarrhea but only goes one time a day with loose stool. She's has subjective fever and chills. For the past 2 weeks however she's had dysuria and urinary frequency. She was actually seen by her GYN a few weeks ago with some urinary incontinence she was described oxybutynin but she has not picked this up yet but she wanted to have her urine checked for infection first. She is no longer being followed by the nutritionist in general for her morbid obesity her insurance would not cover this. She did go to a diabetes education course.    Review Of Systems:  GEN- denies fatigue, +fever, weight loss,weakness, recent illness HEENT- denies eye drainage, change in vision,+ nasal discharge, CVS- denies chest pain, palpitations RESP- denies SOB, +cough, wheeze ABD- denies N/V, change in stools, +abd pain GU- + dysuria, hematuria, dribbling, incontinence MSK- denies joint pain, +muscle aches, injury Neuro- denies headache, dizziness, syncope, seizure activity       Objective:    BP 126/72 mmHg  Pulse 88  Temp(Src) 98.1 F (36.7 C) (Oral)  Resp 16  Ht 5' 5"  (1.651 m)  Wt 375 lb (170.099 kg)  BMI 62.40 kg/m2  LMP 07/15/2015 (Approximate) GEN- NAD, alert and oriented x3 HEENT- PERRL, EOMI, non injected sclera, pink conjunctiva, MMM, oropharynx clear  TM clear bilat no effusion,  no maxillary sinus tenderness,  + Nasal drainage  Neck- Supple, no LAD CVS- RRR, no murmur RESP-CTAB ABD-SOFT NT ND, NABS, no CVA tenderness Pulses- Radial 2+         Assessment & Plan:      Problem List Items Addressed This Visit    None     Visit Diagnoses    UTI (lower urinary tract infection)    -  Primary    Keflex x 5 days, discussed water intake    Relevant Medications    cephALEXin (KEFLEX) 500 MG capsule    fluconazole (DIFLUCAN) 150 MG tablet    Other Relevant Orders    Urinalysis, Routine w reflex microscopic (not at [facility]) (Completed)    Viral URI        Common cold, mucinex DM, supportive care    Relevant Medications    cephALEXin (KEFLEX) 500 MG capsule    fluconazole (DIFLUCAN) 150 MG tablet       Note: This dictation was prepared with Dragon dictation along with smaller phrase technology. Any transcriptional errors that result from this process are unintentional.

## 2015-07-23 NOTE — Patient Instructions (Signed)
Mucinex DM Take antibiotics four times a day  Plenty of water  After you take antibiotics then take the Yeast pill F/U CHnage f/u with 1st Week in December- FASTING

## 2015-07-24 ENCOUNTER — Ambulatory Visit: Payer: Medicare Other | Admitting: Family Medicine

## 2015-07-29 ENCOUNTER — Other Ambulatory Visit: Payer: Self-pay | Admitting: Family Medicine

## 2015-07-29 NOTE — Telephone Encounter (Signed)
Refill appropriate and filled per protocol. 

## 2015-08-20 ENCOUNTER — Encounter: Payer: Self-pay | Admitting: Family Medicine

## 2015-08-20 ENCOUNTER — Ambulatory Visit (INDEPENDENT_AMBULATORY_CARE_PROVIDER_SITE_OTHER): Payer: Medicare Other | Admitting: Family Medicine

## 2015-08-20 VITALS — BP 130/70 | HR 78 | Temp 97.9°F | Resp 18 | Ht 65.0 in | Wt 374.0 lb

## 2015-08-20 DIAGNOSIS — L600 Ingrowing nail: Secondary | ICD-10-CM | POA: Diagnosis not present

## 2015-08-20 DIAGNOSIS — R7303 Prediabetes: Secondary | ICD-10-CM

## 2015-08-20 DIAGNOSIS — E785 Hyperlipidemia, unspecified: Secondary | ICD-10-CM | POA: Diagnosis not present

## 2015-08-20 DIAGNOSIS — R7309 Other abnormal glucose: Secondary | ICD-10-CM | POA: Diagnosis not present

## 2015-08-20 DIAGNOSIS — B351 Tinea unguium: Secondary | ICD-10-CM

## 2015-08-20 DIAGNOSIS — E8881 Metabolic syndrome: Secondary | ICD-10-CM | POA: Diagnosis not present

## 2015-08-20 DIAGNOSIS — I10 Essential (primary) hypertension: Secondary | ICD-10-CM | POA: Diagnosis not present

## 2015-08-20 LAB — CBC WITH DIFFERENTIAL/PLATELET
BASOS ABS: 0 10*3/uL (ref 0.0–0.1)
Basophils Relative: 0 % (ref 0–1)
EOS ABS: 0.1 10*3/uL (ref 0.0–0.7)
EOS PCT: 1 % (ref 0–5)
HCT: 36.3 % (ref 36.0–46.0)
Hemoglobin: 11.7 g/dL — ABNORMAL LOW (ref 12.0–15.0)
LYMPHS ABS: 2.8 10*3/uL (ref 0.7–4.0)
Lymphocytes Relative: 31 % (ref 12–46)
MCH: 26.6 pg (ref 26.0–34.0)
MCHC: 32.2 g/dL (ref 30.0–36.0)
MCV: 82.5 fL (ref 78.0–100.0)
MPV: 9.3 fL (ref 8.6–12.4)
Monocytes Absolute: 0.5 10*3/uL (ref 0.1–1.0)
Monocytes Relative: 6 % (ref 3–12)
Neutro Abs: 5.6 10*3/uL (ref 1.7–7.7)
Neutrophils Relative %: 62 % (ref 43–77)
PLATELETS: 425 10*3/uL — AB (ref 150–400)
RBC: 4.4 MIL/uL (ref 3.87–5.11)
RDW: 15.2 % (ref 11.5–15.5)
WBC: 9.1 10*3/uL (ref 4.0–10.5)

## 2015-08-20 LAB — LIPID PANEL
CHOL/HDL RATIO: 3.3 ratio (ref <=5.0)
Cholesterol: 162 mg/dL (ref 125–200)
HDL: 49 mg/dL (ref >=46)
LDL CALC: 99 mg/dL (ref <130)
TRIGLYCERIDES: 70 mg/dL (ref <150)
VLDL: 14 mg/dL (ref <30)

## 2015-08-20 LAB — COMPREHENSIVE METABOLIC PANEL
ALK PHOS: 117 U/L — AB (ref 33–115)
ALT: 15 U/L (ref 6–29)
AST: 18 U/L (ref 10–30)
Albumin: 3.7 g/dL (ref 3.6–5.1)
BUN: 7 mg/dL (ref 7–25)
CHLORIDE: 104 mmol/L (ref 98–110)
CO2: 26 mmol/L (ref 20–31)
Calcium: 8.8 mg/dL (ref 8.6–10.2)
Creat: 0.64 mg/dL (ref 0.50–1.10)
GLUCOSE: 85 mg/dL (ref 70–99)
POTASSIUM: 4.4 mmol/L (ref 3.5–5.3)
Sodium: 142 mmol/L (ref 135–146)
Total Bilirubin: 0.5 mg/dL (ref 0.2–1.2)
Total Protein: 6.7 g/dL (ref 6.1–8.1)

## 2015-08-20 NOTE — Patient Instructions (Signed)
Referral to foot center in [place]  We will call lab results F/U 3 months

## 2015-08-20 NOTE — Progress Notes (Signed)
Patient ID: Jasmin Castro, female   DOB: May 31, 1976, 39 y.o.   MRN: [id number]   Subjective:    Patient ID: Jasmin Castro, female    DOB: 10-29-1975, 39 y.o.   MRN: [id number]  Patient presents for F/U and L Great Toe Pain  patient here to follow-up chronic medical problems. She is concerned about pain in her left great toe she also felt a little burning spot on the tip of her little toe on her right foot. She states of the great toe is very thick she's had fungus for quite a while in both feet. She is concerned is becoming ingrown. She denies any drainage or any redness.  She is unable to go back to the nutritionist because it is not covered by her insurance. She states that she will do well for a week and drink water and not eat sugary snacks but then she goes back to eating cookies and cakes.  Medications reviewed  Review Of Systems:  GEN- denies fatigue, fever, weight loss,weakness, recent illness HEENT- denies eye drainage, change in vision, nasal discharge, CVS- denies chest pain, palpitations RESP- denies SOB, cough, wheeze ABD- denies N/V, change in stools, abd pain GU- denies dysuria, hematuria, dribbling, incontinence MSK- denies joint pain, muscle aches, injury Neuro- denies headache, dizziness, syncope, seizure activity       Objective:    BP 130/70 mmHg  Pulse 78  Temp(Src) 97.9 F (36.6 C) (Oral)  Resp 18  Ht 5' 5"  (1.651 m)  Wt 374 lb (169.645 kg)  BMI 62.24 kg/m2  LMP 08/06/2015 GEN- NAD, alert and oriented x3,obese  HEENT- PERRL, EOMI, non injected sclera, pink conjunctiva, MMM, oropharynx clear CVS- RRR, no murmur RESP-CTAB EXT- No edema Skin- Callus bilat,no anormality on 5th digit, bilat great toenails thickened, with fungus in nails, ingrown great toenail- left foot Pulses- Radial, DP- 2+        Assessment & Plan:      Problem List Items Addressed This Visit    Prediabetes - Primary   Relevant Orders   Hemoglobin T2T   Metabolic syndrome X    Relevant Orders   Hemoglobin A1c   Hypertension   Relevant Orders   CBC with Differential/Platelet   Comprehensive metabolic panel   Hyperlipidemia   Relevant Orders   Lipid panel    Other Visit Diagnoses    Ingrown nail        Onychomycosis        Ingrown thick nails, needs podiatry to assist, would not use [facility] due to her other medications and burden on liver already, she is unable to stop the zyprexa or Pravastatin       Note: This dictation was prepared with Dragon dictation along with smaller phrase technology. Any transcriptional errors that result from this process are unintentional.

## 2015-08-20 NOTE — Assessment & Plan Note (Addendum)
She is definite metabolic syndrome borderline diabetes severe obesity. We have sent her to resources however she is not dedicated to trying to lose any weight. I discussed with have heart disease or failure diabetes and multiple other comorbidities with her because of her weight as well as shortening her life span

## 2015-08-20 NOTE — Assessment & Plan Note (Signed)
Well controlled 

## 2015-08-21 ENCOUNTER — Encounter: Payer: Self-pay | Admitting: *Deleted

## 2015-08-21 LAB — HEMOGLOBIN A1C
Hgb A1c MFr Bld: 5.3 % (ref <5.7)
Mean Plasma Glucose: 105 mg/dL (ref <117)

## 2015-08-27 ENCOUNTER — Telehealth: Payer: Self-pay | Admitting: Family Medicine

## 2015-08-27 MED ORDER — FLUTICASONE PROPIONATE 50 MCG/ACT NA SUSP: 2.0000 | Freq: Every day | NASAL | Status: DC

## 2015-08-27 NOTE — Telephone Encounter (Signed)
Pt i calling to se if we can call her in a prescription of generic flonase.  She uses the [facility] drug in [place]

## 2015-08-27 NOTE — Telephone Encounter (Signed)
Prescription sent to pharmacy.

## 2015-08-28 ENCOUNTER — Other Ambulatory Visit: Payer: Self-pay | Admitting: Family Medicine

## 2015-08-28 DIAGNOSIS — Z1231 Encounter for screening mammogram for malignant neoplasm of breast: Secondary | ICD-10-CM

## 2015-09-11 ENCOUNTER — Ambulatory Visit (HOSPITAL_COMMUNITY): Payer: Medicare Other

## 2015-09-18 ENCOUNTER — Ambulatory Visit: Payer: Medicare Other | Admitting: Podiatry

## 2015-09-20 ENCOUNTER — Ambulatory Visit: Payer: Medicare Other | Admitting: Family Medicine

## 2015-10-01 ENCOUNTER — Encounter: Payer: Self-pay | Admitting: *Deleted

## 2015-10-02 ENCOUNTER — Ambulatory Visit: Payer: Medicare Other | Admitting: Podiatry

## 2015-10-10 ENCOUNTER — Encounter: Payer: Self-pay | Admitting: *Deleted

## 2015-10-17 ENCOUNTER — Telehealth: Payer: Self-pay | Admitting: Family Medicine

## 2015-10-17 NOTE — Telephone Encounter (Signed)
[phone] Patient is calling to talk to you regarding her pravastatin and giving her leg cramps

## 2015-10-18 NOTE — Telephone Encounter (Signed)
Received call from patient.   States that she has been having increased cramping in her legs. Reports that she stopped taking Pravastatin and cramping resolved.   Requested MD to advise on different treatment for cholesterol.

## 2015-10-18 NOTE — Telephone Encounter (Signed)
Call placed to patient and patient made aware.  

## 2015-10-18 NOTE — Telephone Encounter (Signed)
For now stay off cholesterol medication, we will recheck at next visit She can take fish oil if not doings o 107m BID

## 2015-10-23 ENCOUNTER — Ambulatory Visit (HOSPITAL_COMMUNITY): Payer: Medicare Other

## 2015-10-25 ENCOUNTER — Encounter: Payer: Self-pay | Admitting: Family Medicine

## 2015-10-25 ENCOUNTER — Other Ambulatory Visit: Payer: Self-pay | Admitting: Family Medicine

## 2015-10-25 ENCOUNTER — Ambulatory Visit (INDEPENDENT_AMBULATORY_CARE_PROVIDER_SITE_OTHER): Payer: Medicare Other | Admitting: Family Medicine

## 2015-10-25 VITALS — BP 122/68 | HR 74 | Temp 98.8°F | Resp 16 | Ht 65.0 in | Wt 360.0 lb

## 2015-10-25 DIAGNOSIS — R509 Fever, unspecified: Secondary | ICD-10-CM | POA: Diagnosis not present

## 2015-10-25 DIAGNOSIS — B349 Viral infection, unspecified: Secondary | ICD-10-CM

## 2015-10-25 DIAGNOSIS — N3001 Acute cystitis with hematuria: Secondary | ICD-10-CM

## 2015-10-25 DIAGNOSIS — R300 Dysuria: Secondary | ICD-10-CM | POA: Diagnosis not present

## 2015-10-25 LAB — URINALYSIS, MICROSCOPIC ONLY
CASTS: NONE SEEN [LPF]
Crystals: NONE SEEN [HPF]
YEAST: NONE SEEN [HPF]

## 2015-10-25 LAB — URINALYSIS, ROUTINE W REFLEX MICROSCOPIC
Bilirubin Urine: NEGATIVE
Glucose, UA: NEGATIVE
KETONES UR: NEGATIVE
LEUKOCYTES UA: NEGATIVE
NITRITE: NEGATIVE
PROTEIN: NEGATIVE
Specific Gravity, Urine: 1.02 (ref 1.001–1.035)
pH: 7 (ref 5.0–8.0)

## 2015-10-25 LAB — INFLUENZA A AND B AG, IMMUNOASSAY
INFLUENZA A ANTIGEN: NOT DETECTED
INFLUENZA B ANTIGEN: NOT DETECTED

## 2015-10-25 MED ORDER — CIPROFLOXACIN HCL 500 MG PO TABS: 500.0000 mg | ORAL_TABLET | Freq: Two times a day (BID) | ORAL | Status: DC

## 2015-10-25 NOTE — Progress Notes (Signed)
Patient ID: Jasmin Castro, female   DOB: 03-14-1976, 40 y.o.   MRN: [id number]    Subjective:    Patient ID: Jasmin Castro, female    DOB: Apr 18, 1976, 40 y.o.   MRN: [id number]  Patient presents for Illness and Dysuria  patient here with body aches chills cough congestion for the past couple weeks. A subjective fever. Her mother was also sick with upper respiratory infection. She has not had any nausea vomiting but had a few episodes of diarrhea. She's been taking Robitussin which has helped her cough. She also takes Tylenol as needed.  She's also had dysuria and urinary frequency for the past few days she denies any gross blood in the urine. She also has an odor to the urine.    Review Of Systems:  GEN- denies fatigue, fever, weight loss,weakness, recent illness HEENT- denies eye drainage, change in vision+, nasal discharge, CVS- denies chest pain, palpitations RESP- denies SOB, +cough, wheeze ABD- denies N/V, change in stools, abd pain GU- + dysuria, hematuria, dribbling, incontinence Neuro- denies headache, dizziness, syncope, seizure activity       Objective:    BP 122/68 mmHg  Pulse 74  Temp(Src) 98.8 F (37.1 C) (Oral)  Resp 16  Ht 5' 5"  (1.651 m)  Wt 360 lb (163.295 kg)  BMI 59.91 kg/m2  LMP 09/30/2015 (Approximate) GEN- NAD, alert and oriented x3 HEENT- PERRL, EOMI, non injected sclera, pink conjunctiva, MMM, oropharynx clear, TM clear no effusion, nares clear rhinorrhea Neck- Supple, No LAD CVS- RRR, no murmur RESP-CTAB ABD-NABS,soft,NT,ND, NO cva TENDERNESS Pulses- Radial,  2+        Assessment & Plan:      Problem List Items Addressed This Visit    None    Visit Diagnoses    Viral illness    -  Primary    Continue OTC cough medicine, Flu test neg    Relevant Orders    Influenza a and b    Acute cystitis with hematuria        Microscopic hematuria and symptomatic, give Cipro x 3 days    Relevant Orders    Urinalysis, Routine w reflex microscopic  (not at [facility]) (Completed)       Note: This dictation was prepared with Dragon dictation along with smaller phrase technology. Any transcriptional errors that result from this process are unintentional.

## 2015-10-25 NOTE — Patient Instructions (Signed)
Take antibiotics for next 3 days only Continue robitussin Great job on the weight loss F/U as scheduled

## 2015-11-04 ENCOUNTER — Ambulatory Visit (HOSPITAL_COMMUNITY): Payer: Medicare Other

## 2015-11-18 ENCOUNTER — Ambulatory Visit: Payer: Medicare Other | Admitting: Family Medicine

## 2015-12-23 ENCOUNTER — Other Ambulatory Visit: Payer: Self-pay | Admitting: Family Medicine

## 2015-12-23 NOTE — Telephone Encounter (Signed)
Refill appropriate and filled per protocol. 

## 2015-12-30 ENCOUNTER — Other Ambulatory Visit: Payer: Self-pay | Admitting: Family Medicine

## 2015-12-30 NOTE — Telephone Encounter (Signed)
Refill appropriate and filled per protocol. 

## 2016-03-03 ENCOUNTER — Ambulatory Visit: Payer: Medicare Other | Admitting: Family Medicine

## 2016-03-20 ENCOUNTER — Other Ambulatory Visit: Payer: Self-pay | Admitting: Family Medicine

## 2016-03-20 NOTE — Telephone Encounter (Signed)
Refill appropriate and filled per protocol. 

## 2016-04-27 DIAGNOSIS — F259 Schizoaffective disorder, unspecified: Secondary | ICD-10-CM | POA: Diagnosis not present

## 2016-04-27 DIAGNOSIS — F329 Major depressive disorder, single episode, unspecified: Secondary | ICD-10-CM | POA: Diagnosis not present

## 2016-04-27 DIAGNOSIS — Z5181 Encounter for therapeutic drug level monitoring: Secondary | ICD-10-CM | POA: Diagnosis not present

## 2016-04-27 DIAGNOSIS — F4010 Social phobia, unspecified: Secondary | ICD-10-CM | POA: Diagnosis not present

## 2016-04-27 DIAGNOSIS — F4001 Agoraphobia with panic disorder: Secondary | ICD-10-CM | POA: Diagnosis not present

## 2016-05-20 DIAGNOSIS — F259 Schizoaffective disorder, unspecified: Secondary | ICD-10-CM | POA: Diagnosis not present

## 2016-05-20 DIAGNOSIS — F329 Major depressive disorder, single episode, unspecified: Secondary | ICD-10-CM | POA: Diagnosis not present

## 2016-05-20 DIAGNOSIS — Z5181 Encounter for therapeutic drug level monitoring: Secondary | ICD-10-CM | POA: Diagnosis not present

## 2016-05-20 DIAGNOSIS — F4010 Social phobia, unspecified: Secondary | ICD-10-CM | POA: Diagnosis not present

## 2016-05-20 DIAGNOSIS — F4001 Agoraphobia with panic disorder: Secondary | ICD-10-CM | POA: Diagnosis not present

## 2016-06-19 ENCOUNTER — Other Ambulatory Visit: Payer: Self-pay | Admitting: Family Medicine

## 2016-06-26 ENCOUNTER — Other Ambulatory Visit: Payer: Self-pay | Admitting: Family Medicine

## 2016-06-26 DIAGNOSIS — B372 Candidiasis of skin and nail: Secondary | ICD-10-CM

## 2016-07-03 ENCOUNTER — Ambulatory Visit (INDEPENDENT_AMBULATORY_CARE_PROVIDER_SITE_OTHER): Payer: Medicare Other | Admitting: Family Medicine

## 2016-07-03 ENCOUNTER — Encounter: Payer: Self-pay | Admitting: Family Medicine

## 2016-07-03 VITALS — BP 138/74 | HR 84 | Temp 98.7°F | Resp 18 | Ht 65.0 in | Wt 368.0 lb

## 2016-07-03 DIAGNOSIS — I10 Essential (primary) hypertension: Secondary | ICD-10-CM | POA: Diagnosis not present

## 2016-07-03 DIAGNOSIS — J069 Acute upper respiratory infection, unspecified: Secondary | ICD-10-CM

## 2016-07-03 MED ORDER — AZITHROMYCIN 250 MG PO TABS: ORAL_TABLET | ORAL | 0 refills | Status: DC

## 2016-07-03 MED ORDER — ALBUTEROL SULFATE HFA 108 (90 BASE) MCG/ACT IN AERS: 2.0000 | INHALATION_SPRAY | Freq: Four times a day (QID) | RESPIRATORY_TRACT | 6 refills | Status: DC | PRN

## 2016-07-03 NOTE — Patient Instructions (Addendum)
Use inhaler Use the cough medicine  F/U 3 months Physical - fasting

## 2016-07-03 NOTE — Progress Notes (Signed)
   Subjective:    Patient ID: Jasmin Castro, female    DOB: 1976/03/29, 40 y.o.   MRN: [id number]  Patient presents for Illness (x2 weeks- productive cough with brown colored mucus, HA, wheezing, SOB)       Here with cough with production wheezing shortness of breath over the past 2 weeks. Initially started with some sinus drainage sore throat. Her cough now has little production. The last time she had wheezing was a few days ago she started using her albuterol inhaler which has helped. She's not had any fever. Positive sick contact with some family members. Her insurance did not cover the cough syrup last time. Her medications were reviewed   Review Of Systems:  GEN- denies fatigue, fever, weight loss,weakness, recent illness HEENT- denies eye drainage, change in vision, nasal discharge, CVS- denies chest pain, palpitations RESP- denies SOB, cough, wheeze ABD- denies N/V, change in stools, abd pain GU- denies dysuria, hematuria, dribbling, incontinence MSK- denies joint pain, muscle aches, injury Neuro- denies headache, dizziness, syncope, seizure activity       Objective:    BP 138/74 (BP Location: Left Arm, Patient Position: Sitting, Cuff Size: Large)   Pulse 84   Temp 98.7 F (37.1 C) (Oral)   Resp 18   Ht 5' 5"  (1.651 m)   Wt (!) 368 lb (166.9 kg)   SpO2 98% Comment: RA  BMI 61.24 kg/m  GEN- NAD, alert and oriented x3 HEENT- PERRL, EOMI, non injected sclera, pink conjunctiva, MMM, oropharynx mild injection, TM clear bilat no effusion,  No  maxillary sinus tenderness,+ inflammed turbinates,  Nasal drainage  Neck- Supple, shotty  LAD CVS- RRR, no murmur RESP-CTAB EXT- No edema Pulses- Radial 2+         Assessment & Plan:      Problem List Items Addressed This Visit    Hypertension    Controlled, no changes, plan for fasting labs with CPE       Other Visit Diagnoses    Acute URI    -  Primary   wosrening prolonged symptoms in morbidly obese pt, wheezing,  treat with albuterol OTC cough, zpak   Relevant Medications   azithromycin (ZITHROMAX) 250 MG tablet      Note: This dictation was prepared with Dragon dictation along with smaller phrase technology. Any transcriptional errors that result from this process are unintentional.

## 2016-07-03 NOTE — Assessment & Plan Note (Signed)
Controlled, no changes, plan for fasting labs with CPE

## 2016-07-27 DIAGNOSIS — F4001 Agoraphobia with panic disorder: Secondary | ICD-10-CM | POA: Diagnosis not present

## 2016-07-27 DIAGNOSIS — F4010 Social phobia, unspecified: Secondary | ICD-10-CM | POA: Diagnosis not present

## 2016-07-27 DIAGNOSIS — F259 Schizoaffective disorder, unspecified: Secondary | ICD-10-CM | POA: Diagnosis not present

## 2016-07-27 DIAGNOSIS — F329 Major depressive disorder, single episode, unspecified: Secondary | ICD-10-CM | POA: Diagnosis not present

## 2016-07-27 DIAGNOSIS — Z5181 Encounter for therapeutic drug level monitoring: Secondary | ICD-10-CM | POA: Diagnosis not present

## 2016-08-17 DIAGNOSIS — M1712 Unilateral primary osteoarthritis, left knee: Secondary | ICD-10-CM | POA: Diagnosis not present

## 2016-10-05 ENCOUNTER — Ambulatory Visit: Payer: Medicare Other | Admitting: Family Medicine

## 2016-10-06 ENCOUNTER — Ambulatory Visit: Payer: Medicare Other | Admitting: Family Medicine

## 2016-10-20 ENCOUNTER — Other Ambulatory Visit: Payer: Self-pay | Admitting: Family Medicine

## 2016-10-20 NOTE — Telephone Encounter (Signed)
Medication refill for one time only.  Patient needs to be seen.

## 2016-10-26 DIAGNOSIS — F259 Schizoaffective disorder, unspecified: Secondary | ICD-10-CM | POA: Diagnosis not present

## 2016-10-26 DIAGNOSIS — Z5181 Encounter for therapeutic drug level monitoring: Secondary | ICD-10-CM | POA: Diagnosis not present

## 2016-10-26 DIAGNOSIS — F4010 Social phobia, unspecified: Secondary | ICD-10-CM | POA: Diagnosis not present

## 2016-10-26 DIAGNOSIS — F4001 Agoraphobia with panic disorder: Secondary | ICD-10-CM | POA: Diagnosis not present

## 2016-10-26 DIAGNOSIS — F329 Major depressive disorder, single episode, unspecified: Secondary | ICD-10-CM | POA: Diagnosis not present

## 2016-12-21 ENCOUNTER — Other Ambulatory Visit: Payer: Self-pay | Admitting: Family Medicine

## 2017-01-22 ENCOUNTER — Ambulatory Visit: Payer: Medicare Other | Admitting: Family Medicine

## 2017-01-26 ENCOUNTER — Encounter: Payer: Self-pay | Admitting: Family Medicine

## 2017-01-26 ENCOUNTER — Ambulatory Visit (INDEPENDENT_AMBULATORY_CARE_PROVIDER_SITE_OTHER): Payer: Medicare Other | Admitting: Family Medicine

## 2017-01-26 ENCOUNTER — Other Ambulatory Visit: Payer: Self-pay | Admitting: Family Medicine

## 2017-01-26 VITALS — BP 122/74 | HR 90 | Temp 98.3°F | Resp 18 | Ht 65.0 in | Wt 384.0 lb

## 2017-01-26 DIAGNOSIS — I10 Essential (primary) hypertension: Secondary | ICD-10-CM | POA: Diagnosis not present

## 2017-01-26 DIAGNOSIS — M2550 Pain in unspecified joint: Secondary | ICD-10-CM

## 2017-01-26 DIAGNOSIS — R7303 Prediabetes: Secondary | ICD-10-CM

## 2017-01-26 DIAGNOSIS — J029 Acute pharyngitis, unspecified: Secondary | ICD-10-CM | POA: Diagnosis not present

## 2017-01-26 DIAGNOSIS — E782 Mixed hyperlipidemia: Secondary | ICD-10-CM

## 2017-01-26 DIAGNOSIS — F4010 Social phobia, unspecified: Secondary | ICD-10-CM | POA: Diagnosis not present

## 2017-01-26 DIAGNOSIS — F4001 Agoraphobia with panic disorder: Secondary | ICD-10-CM | POA: Diagnosis not present

## 2017-01-26 DIAGNOSIS — Z5181 Encounter for therapeutic drug level monitoring: Secondary | ICD-10-CM | POA: Diagnosis not present

## 2017-01-26 DIAGNOSIS — F259 Schizoaffective disorder, unspecified: Secondary | ICD-10-CM | POA: Diagnosis not present

## 2017-01-26 DIAGNOSIS — F329 Major depressive disorder, single episode, unspecified: Secondary | ICD-10-CM | POA: Diagnosis not present

## 2017-01-26 DIAGNOSIS — E8881 Metabolic syndrome: Secondary | ICD-10-CM

## 2017-01-26 DIAGNOSIS — M130 Polyarthritis, unspecified: Secondary | ICD-10-CM | POA: Diagnosis not present

## 2017-01-26 LAB — CBC WITH DIFFERENTIAL/PLATELET
BASOS PCT: 0 %
Basophils Absolute: 0 cells/uL (ref 0–200)
EOS ABS: 100 {cells}/uL (ref 15–500)
Eosinophils Relative: 1 %
HEMATOCRIT: 36.6 % (ref 35.0–45.0)
Hemoglobin: 11.5 g/dL — ABNORMAL LOW (ref 12.0–15.0)
LYMPHS ABS: 3000 {cells}/uL (ref 850–3900)
Lymphocytes Relative: 30 %
MCH: 26 pg — ABNORMAL LOW (ref 27.0–33.0)
MCHC: 31.4 g/dL — ABNORMAL LOW (ref 32.0–36.0)
MCV: 82.6 fL (ref 80.0–100.0)
MPV: 9.1 fL (ref 7.5–12.5)
Monocytes Absolute: 900 cells/uL (ref 200–950)
Monocytes Relative: 9 %
Neutro Abs: 6000 cells/uL (ref 1500–7800)
Neutrophils Relative %: 60 %
PLATELETS: 444 10*3/uL — AB (ref 140–400)
RBC: 4.43 MIL/uL (ref 3.80–5.10)
RDW: 15.6 % — ABNORMAL HIGH (ref 11.0–15.0)
WBC: 10 10*3/uL (ref 3.8–10.8)

## 2017-01-26 LAB — LIPID PANEL
CHOL/HDL RATIO: 3.9 ratio (ref <5.0)
Cholesterol: 223 mg/dL — ABNORMAL HIGH (ref <200)
HDL: 57 mg/dL (ref >50)
LDL Cholesterol: 150 mg/dL — ABNORMAL HIGH (ref <100)
Triglycerides: 78 mg/dL (ref <150)
VLDL: 16 mg/dL (ref <30)

## 2017-01-26 LAB — COMPREHENSIVE METABOLIC PANEL
ALK PHOS: 129 U/L — AB (ref 33–115)
ALT: 16 U/L (ref 6–29)
AST: 17 U/L (ref 10–30)
Albumin: 3.8 g/dL (ref 3.6–5.1)
BILIRUBIN TOTAL: 0.4 mg/dL (ref 0.2–1.2)
BUN: 6 mg/dL — AB (ref 7–25)
CO2: 23 mmol/L (ref 20–31)
Calcium: 9.5 mg/dL (ref 8.6–10.2)
Chloride: 105 mmol/L (ref 98–110)
Creat: 0.57 mg/dL (ref 0.50–1.10)
GLUCOSE: 87 mg/dL (ref 70–99)
Potassium: 4.4 mmol/L (ref 3.5–5.3)
Sodium: 140 mmol/L (ref 135–146)
TOTAL PROTEIN: 7.1 g/dL (ref 6.1–8.1)

## 2017-01-26 LAB — STREP GROUP A AG, W/REFLEX TO CULT: STREGTOCOCCUS GROUP A AG SCREEN: NOT DETECTED

## 2017-01-26 NOTE — Progress Notes (Signed)
Subjective:    Patient ID: Jasmin Castro, female    DOB: Nov 06, 1975, 41 y.o.   MRN: [id number]  Patient presents for Illness (x1 month- naausea, abd pain/ cramping, fever/chills, joint pain, ear pain, HA, L sided nasal pain)   Pt here with multiple symptoms for Past 6 weeks. States that her family did have a stomach bug she's had some loose bowels since then denies any watery diarrhea no blood in the stool. She's had nausea and occasional headache has also had fever and chills the past couple weeks she's had some ear pains and sore throat. Denies any nasal drainage denies any pain with eating even though she does get some nausea. She has not had any vomiting. She not take anything different over-the-counter. States that her joints also hurt. She has not had any change in her medication from her psychiatrist No tick or insect bites     Review Of Systems: per above   GEN- + fatigue, fever, weight loss,weakness, recent illness HEENT- denies eye drainage, change in vision, nasal discharge, CVS- denies chest pain, palpitations RESP- denies SOB, cough, wheeze ABD- + N/V, +change in stools, +abd pain GU- denies dysuria, hematuria, dribbling, incontinence MSK- + joint pain, +muscle aches, injury Neuro- +headache, dizziness, syncope, seizure activity       Objective:    BP 122/74   Pulse 90   Temp 98.3 F (36.8 C) (Oral)   Resp 18   Ht 5' 5" (1.651 m)   Wt (!) 384 lb (174.2 kg)   LMP 01/11/2017 (Approximate) Comment: regular  SpO2 98%   BMI 63.90 kg/m  GEN- NAD, alert and oriented x3,obese  HEENT- PERRL, EOMI, non injected sclera, pink conjunctiva, MMM, oropharynx clear, nares clear rhinorrhea, no maxillary or frontal sinus tenderness, TM clear no effusion  Neck- Supple, no thyromegaly, no LAD CVS- RRR, no murmur RESP-CTAB ABD-NABS,soft,NT,ND EXT- No edema MSK- no swelling of hands or ankles Pulses- Radial,+        Assessment & Plan:      Problem List Items Addressed  This Visit    Prediabetes   Relevant Orders   Hemoglobin A1c   Morbid obesity ([place])    She continues to gain weight and has hypertension and prediabetes. We'll check these labs. Her symptoms are very vague and difficult to tease out as she has a positive review of system and almost every category. She actually looks fairly well seen her multiple occasion she does not look any different than previous I see no signs of any distress no cardiac respiratory changes on exam I did swab her throat is with negative culture was sent. I do not see any actual joint swelling. Abdomen the check some labs including CRP ESR. She denies any NSAID bites or tick related illness.      Metabolic syndrome X   Hypertension    Controlled       Relevant Orders   CBC with Differential/Platelet   Comprehensive metabolic panel   Hyperlipidemia - Primary   Relevant Orders   Lipid panel    Other Visit Diagnoses    Sore throat       Relevant Orders   STREP GROUP A AG, W/REFLEX TO CULT (Completed)   Polyarthralgia       Relevant Orders   Sedimentation Rate   C-reactive protein      Note: This dictation was prepared with Dragon dictation along with smaller phrase technology. Any transcriptional errors that result from this process are unintentional.

## 2017-01-26 NOTE — Assessment & Plan Note (Signed)
Controlled.  

## 2017-01-26 NOTE — Patient Instructions (Addendum)
We will call with results  F/U 4 months for Physical

## 2017-01-26 NOTE — Assessment & Plan Note (Signed)
She continues to gain weight and has hypertension and prediabetes. We'll check these labs. Her symptoms are very vague and difficult to tease out as she has a positive review of system and almost every category. She actually looks fairly well seen her multiple occasion she does not look any different than previous I see no signs of any distress no cardiac respiratory changes on exam I did swab her throat is with negative culture was sent. I do not see any actual joint swelling. Abdomen the check some labs including CRP ESR. She denies any NSAID bites or tick related illness.

## 2017-01-27 LAB — HEMOGLOBIN A1C
HEMOGLOBIN A1C: 5 % (ref <5.7)
MEAN PLASMA GLUCOSE: 97 mg/dL

## 2017-01-27 LAB — C-REACTIVE PROTEIN: CRP: 14.9 mg/L — ABNORMAL HIGH (ref <8.0)

## 2017-01-27 LAB — SEDIMENTATION RATE: SED RATE: 11 mm/h (ref 0–20)

## 2017-01-28 ENCOUNTER — Other Ambulatory Visit: Payer: Self-pay | Admitting: *Deleted

## 2017-01-28 LAB — CULTURE, GROUP A STREP

## 2017-01-28 MED ORDER — SIMVASTATIN 20 MG PO TABS: 20.0000 mg | ORAL_TABLET | Freq: Every day | ORAL | 3 refills | Status: DC

## 2017-01-29 ENCOUNTER — Other Ambulatory Visit: Payer: Self-pay | Admitting: Family Medicine

## 2017-01-29 LAB — ANA: Anti Nuclear Antibody(ANA): NEGATIVE

## 2017-03-22 DIAGNOSIS — F4010 Social phobia, unspecified: Secondary | ICD-10-CM | POA: Diagnosis not present

## 2017-03-22 DIAGNOSIS — F329 Major depressive disorder, single episode, unspecified: Secondary | ICD-10-CM | POA: Diagnosis not present

## 2017-03-22 DIAGNOSIS — Z5181 Encounter for therapeutic drug level monitoring: Secondary | ICD-10-CM | POA: Diagnosis not present

## 2017-03-22 DIAGNOSIS — F4001 Agoraphobia with panic disorder: Secondary | ICD-10-CM | POA: Diagnosis not present

## 2017-03-22 DIAGNOSIS — F259 Schizoaffective disorder, unspecified: Secondary | ICD-10-CM | POA: Diagnosis not present

## 2017-05-24 DIAGNOSIS — F4001 Agoraphobia with panic disorder: Secondary | ICD-10-CM | POA: Diagnosis not present

## 2017-05-24 DIAGNOSIS — F259 Schizoaffective disorder, unspecified: Secondary | ICD-10-CM | POA: Diagnosis not present

## 2017-05-24 DIAGNOSIS — F329 Major depressive disorder, single episode, unspecified: Secondary | ICD-10-CM | POA: Diagnosis not present

## 2017-05-24 DIAGNOSIS — Z5181 Encounter for therapeutic drug level monitoring: Secondary | ICD-10-CM | POA: Diagnosis not present

## 2017-05-24 DIAGNOSIS — F4010 Social phobia, unspecified: Secondary | ICD-10-CM | POA: Diagnosis not present

## 2017-07-06 ENCOUNTER — Other Ambulatory Visit: Payer: Self-pay | Admitting: Family Medicine

## 2017-08-12 ENCOUNTER — Other Ambulatory Visit: Payer: Self-pay | Admitting: Family Medicine

## 2017-10-20 ENCOUNTER — Other Ambulatory Visit: Payer: Self-pay | Admitting: Family Medicine

## 2017-11-23 DIAGNOSIS — F6381 Intermittent explosive disorder: Secondary | ICD-10-CM | POA: Diagnosis not present

## 2017-12-08 ENCOUNTER — Ambulatory Visit (INDEPENDENT_AMBULATORY_CARE_PROVIDER_SITE_OTHER): Payer: Medicare Other | Admitting: Family Medicine

## 2017-12-08 ENCOUNTER — Other Ambulatory Visit: Payer: Self-pay

## 2017-12-08 ENCOUNTER — Encounter: Payer: Self-pay | Admitting: Family Medicine

## 2017-12-08 VITALS — BP 132/70 | HR 96 | Temp 98.4°F | Resp 16 | Ht 65.0 in | Wt >= 6400 oz

## 2017-12-08 DIAGNOSIS — R7303 Prediabetes: Secondary | ICD-10-CM

## 2017-12-08 DIAGNOSIS — B3731 Acute candidiasis of vulva and vagina: Secondary | ICD-10-CM

## 2017-12-08 DIAGNOSIS — R8290 Unspecified abnormal findings in urine: Secondary | ICD-10-CM | POA: Diagnosis not present

## 2017-12-08 DIAGNOSIS — E8881 Metabolic syndrome: Secondary | ICD-10-CM | POA: Diagnosis not present

## 2017-12-08 DIAGNOSIS — E782 Mixed hyperlipidemia: Secondary | ICD-10-CM

## 2017-12-08 DIAGNOSIS — B373 Candidiasis of vulva and vagina: Secondary | ICD-10-CM

## 2017-12-08 DIAGNOSIS — I10 Essential (primary) hypertension: Secondary | ICD-10-CM

## 2017-12-08 LAB — URINALYSIS, ROUTINE W REFLEX MICROSCOPIC
BILIRUBIN URINE: NEGATIVE
Glucose, UA: NEGATIVE
Ketones, ur: NEGATIVE
NITRITE: NEGATIVE
SPECIFIC GRAVITY, URINE: 1.025 (ref 1.001–1.03)
pH: 6 (ref 5.0–8.0)

## 2017-12-08 LAB — MICROSCOPIC MESSAGE

## 2017-12-08 MED ORDER — FLUCONAZOLE 150 MG PO TABS: ORAL_TABLET | ORAL | 0 refills | Status: DC

## 2017-12-08 NOTE — Patient Instructions (Signed)
No UTI Take the diflucan Schedule a physical in 4 months

## 2017-12-08 NOTE — Assessment & Plan Note (Addendum)
Controlled no changes,check fasting labs, diabetes mellitus coronary artery disease discussed the importance of her cutting out her junk food snacking all day long her portion size she is high risk for early death because of her severe morbid obesity. She was given handouts on portion control meal planning her insurance does not cover dietitian.  Because of her psychiatric medications and her insurance she is not a good candidate for the weight loss medications. Or if her A1c is elevated I think she would be a good candidate for Vicotza or the once weekly Ozempic

## 2017-12-08 NOTE — Progress Notes (Signed)
Subjective:    Patient ID: Jasmin Castro, female    DOB: Jul 06, 1976, 42 y.o.   MRN: [id number]  Patient presents for Illness (3 weeks- nausea, loose stools, nasal congestion, HA, fatigue); Dysuria (x1 week- urinary frequency and odor to urine); and Foot Issues (L foot great toe- indentation to toe under nail plate after trimming)   Past 1-2 weeks, burning on and off, itching and odor , urinary frequency. Had cranberry juice. Has had some cold chills   3 weeks ago started with nasal congestion, tonsils rocks, had nausea, no vomiting, diarrhea has now resolved.   + sick contacts   Left great toe- she was trying to cut her toenail and now notices an indentation on nail    Morbid obesity- she has gained 24lbs  Since last May, she is eating all the times, eats junk food all day    Fasting today    HTN- taking BP med as prescribed       Review Of Systems:  GEN- denies fatigue, fever, weight loss,weakness, recent illness HEENT- denies eye drainage, change in vision, nasal discharge, CVS- denies chest pain, palpitations RESP- denies SOB, cough, wheeze ABD- denies N/V, change in stools, abd pain GU- + dysuria, hematuria, dribbling, incontinence MSK- + joint pain, muscle aches, injury Neuro- denies headache, dizziness, syncope, seizure activity       Objective:    BP 132/70   Pulse 96   Temp 98.4 F (36.9 C) (Oral)   Resp 16   Ht 5' 5"  (1.651 m)   Wt (!) 408 lb (185.1 kg)   SpO2 100%   BMI 67.89 kg/m  GEN- NAD, alert and oriented x3 HEENT- PERRL, EOMI, non injected sclera, pink conjunctiva, MMM, oropharynx clear, Tm clear b ilat no effusion Neck- Supple, no thyromegaly, no LAD CVS- RRR, no murmur RESP-CTAB ABD-NABS,soft,NT,ND, no CVA, very large pannus EXT- No edema, beneath nails, no erythema, callus of foot  Pulses- Radial, DP- 2+        Assessment & Plan:      Problem List Items Addressed This Visit      Unprioritized   Prediabetes   Relevant Orders   Hemoglobin A1c   Hyperlipidemia   Relevant Orders   Lipid panel   Metabolic syndrome X   Relevant Orders   Lipid panel   Hemoglobin A1c   Morbid obesity ([facility])   Hypertension    Controlled no changes,check fasting labs, diabetes mellitus coronary artery disease discussed the importance of her cutting out her junk food snacking all day long her portion size she is high risk for early death because of her severe morbid obesity. She was given handouts on portion control meal planning her insurance does not cover dietitian.  Because of her psychiatric medications and her insurance she is not a good candidate for the weight loss medications. Or if her A1c is elevated I think she would be a good candidate for Vicotza or the once weekly Ozempic      Relevant Orders   CBC with Differential/Platelet   Comprehensive metabolic panel    Other Visit Diagnoses    Malodorous urine    -  Primary   No UTI, with vaginal itching, habitus difficult to examin, treat for presumptive yeast, given diflucan, previous Viral symptoms resolved   Relevant Orders   Urinalysis, Routine w reflex microscopic (Completed)   Vaginal yeast infection       Relevant Medications   fluconazole (DIFLUCAN) 150 MG tablet  Note: This dictation was prepared with Dragon dictation along with smaller phrase technology. Any transcriptional errors that result from this process are unintentional.

## 2017-12-09 LAB — CBC WITH DIFFERENTIAL/PLATELET
Basophils Absolute: 21 cells/uL (ref 0–200)
Basophils Relative: 0.2 %
EOS ABS: 53 {cells}/uL (ref 15–500)
Eosinophils Relative: 0.5 %
HCT: 34.8 % — ABNORMAL LOW (ref 35.0–45.0)
Hemoglobin: 11 g/dL — ABNORMAL LOW (ref 11.7–15.5)
Lymphs Abs: 2936 cells/uL (ref 850–3900)
MCH: 24.9 pg — AB (ref 27.0–33.0)
MCHC: 31.6 g/dL — ABNORMAL LOW (ref 32.0–36.0)
MCV: 78.9 fL — AB (ref 80.0–100.0)
MONOS PCT: 7.5 %
MPV: 10 fL (ref 7.5–12.5)
NEUTROS PCT: 64.1 %
Neutro Abs: 6795 cells/uL (ref 1500–7800)
PLATELETS: 437 10*3/uL — AB (ref 140–400)
RBC: 4.41 10*6/uL (ref 3.80–5.10)
RDW: 14.8 % (ref 11.0–15.0)
TOTAL LYMPHOCYTE: 27.7 %
WBC: 10.6 10*3/uL (ref 3.8–10.8)
WBCMIX: 795 {cells}/uL (ref 200–950)

## 2017-12-09 LAB — LIPID PANEL
Cholesterol: 207 mg/dL — ABNORMAL HIGH (ref <200)
HDL: 47 mg/dL — ABNORMAL LOW (ref >50)
LDL CHOLESTEROL (CALC): 140 mg/dL — AB
NON-HDL CHOLESTEROL (CALC): 160 mg/dL — AB (ref <130)
TRIGLYCERIDES: 96 mg/dL (ref <150)
Total CHOL/HDL Ratio: 4.4 (calc) (ref <5.0)

## 2017-12-09 LAB — COMPREHENSIVE METABOLIC PANEL
AG RATIO: 1.3 (calc) (ref 1.0–2.5)
ALT: 12 U/L (ref 6–29)
AST: 12 U/L (ref 10–30)
Albumin: 3.8 g/dL (ref 3.6–5.1)
Alkaline phosphatase (APISO): 112 U/L (ref 33–115)
BUN: 7 mg/dL (ref 7–25)
CO2: 23 mmol/L (ref 20–32)
Calcium: 9.4 mg/dL (ref 8.6–10.2)
Chloride: 105 mmol/L (ref 98–110)
Creat: 0.6 mg/dL (ref 0.50–1.10)
GLUCOSE: 91 mg/dL (ref 65–99)
Globulin: 3 g/dL (calc) (ref 1.9–3.7)
Potassium: 4.1 mmol/L (ref 3.5–5.3)
SODIUM: 139 mmol/L (ref 135–146)
TOTAL PROTEIN: 6.8 g/dL (ref 6.1–8.1)
Total Bilirubin: 0.4 mg/dL (ref 0.2–1.2)

## 2017-12-09 LAB — HEMOGLOBIN A1C
HEMOGLOBIN A1C: 5.3 %{Hb} (ref <5.7)
Mean Plasma Glucose: 105 (calc)
eAG (mmol/L): 5.8 (calc)

## 2017-12-10 ENCOUNTER — Other Ambulatory Visit: Payer: Self-pay | Admitting: *Deleted

## 2017-12-10 ENCOUNTER — Telehealth: Payer: Self-pay | Admitting: *Deleted

## 2017-12-10 MED ORDER — ATORVASTATIN CALCIUM 10 MG PO TABS: 10.0000 mg | ORAL_TABLET | Freq: Every day | ORAL | 3 refills | Status: DC

## 2017-12-10 NOTE — Telephone Encounter (Signed)
Call placed to patient and patient made aware.  

## 2017-12-10 NOTE — Telephone Encounter (Signed)
Received call from patient.   Reports that she was seen on 12/08/2017. States that she continues to have HA and nose pain. Reports that nausea continues. Reports that she now has nonproductive cough that began on 12/09/2017.  Requested MD to advise.

## 2017-12-10 NOTE — Telephone Encounter (Signed)
Take claritin or zyrtec Saline spray Tylenol OTC cough medicine Make OV in 1 week if not improved, this is sinuses/viral

## 2017-12-20 DIAGNOSIS — F259 Schizoaffective disorder, unspecified: Secondary | ICD-10-CM | POA: Diagnosis not present

## 2017-12-20 DIAGNOSIS — F4010 Social phobia, unspecified: Secondary | ICD-10-CM | POA: Diagnosis not present

## 2017-12-20 DIAGNOSIS — F329 Major depressive disorder, single episode, unspecified: Secondary | ICD-10-CM | POA: Diagnosis not present

## 2017-12-20 DIAGNOSIS — Z79899 Other long term (current) drug therapy: Secondary | ICD-10-CM | POA: Diagnosis not present

## 2017-12-20 DIAGNOSIS — F4001 Agoraphobia with panic disorder: Secondary | ICD-10-CM | POA: Diagnosis not present

## 2017-12-20 DIAGNOSIS — F6381 Intermittent explosive disorder: Secondary | ICD-10-CM | POA: Diagnosis not present

## 2017-12-23 ENCOUNTER — Other Ambulatory Visit: Payer: Self-pay | Admitting: Family Medicine

## 2018-01-10 ENCOUNTER — Other Ambulatory Visit: Payer: Self-pay | Admitting: Family Medicine

## 2018-01-14 ENCOUNTER — Encounter: Payer: Self-pay | Admitting: Family Medicine

## 2018-01-14 ENCOUNTER — Other Ambulatory Visit: Payer: Self-pay

## 2018-01-14 ENCOUNTER — Ambulatory Visit (INDEPENDENT_AMBULATORY_CARE_PROVIDER_SITE_OTHER): Payer: Medicare Other | Admitting: Family Medicine

## 2018-01-14 VITALS — BP 142/88 | HR 90 | Temp 98.1°F | Resp 16 | Ht 65.0 in | Wt >= 6400 oz

## 2018-01-14 DIAGNOSIS — J45901 Unspecified asthma with (acute) exacerbation: Secondary | ICD-10-CM | POA: Diagnosis not present

## 2018-01-14 DIAGNOSIS — J208 Acute bronchitis due to other specified organisms: Secondary | ICD-10-CM | POA: Diagnosis not present

## 2018-01-14 MED ORDER — PREDNISONE 20 MG PO TABS: 40.0000 mg | ORAL_TABLET | Freq: Every day | ORAL | 0 refills | Status: DC

## 2018-01-14 MED ORDER — AZITHROMYCIN 250 MG PO TABS: ORAL_TABLET | ORAL | 0 refills | Status: DC

## 2018-01-14 NOTE — Patient Instructions (Addendum)
Robitussin DM for cough Use inhaler for wheezing  Take prednisone Take zpak  F/U as previous

## 2018-01-14 NOTE — Progress Notes (Signed)
   Subjective:    Patient ID: Jasmin Castro, female    DOB: Nov 09, 1975, 42 y.o.   MRN: [id number]  Patient presents for Illness (x2 weeks- productive cough with green mucus, SOB, fatigue, vertigo)  Pt here with cough with congestion, wheezing episodes. Thick production with green sputum. No fever Using benadryl, advil. Using cough drops  Has albuterl but has not used yet  Weight down 7lbs in past month        Review Of Systems:  GEN- denies fatigue, fever, weight loss,weakness, recent illness HEENT- denies eye drainage, change in vision,+ nasal discharge, CVS- denies chest pain, palpitations RESP- denies SOB, +cough+, wheeze ABD- denies N/V, change in stools, abd pain GU- denies dysuria, hematuria, dribbling, incontinence MSK- denies joint pain, muscle aches, injury Neuro- denies headache, dizziness, syncope, seizure activity       Objective:    BP (!) 142/88   Pulse 90   Temp 98.1 F (36.7 C) (Oral)   Resp 16   Ht 5' 5"  (1.651 m)   Wt (!) 401 lb 12.8 oz (182.3 kg)   SpO2 96%   BMI 66.86 kg/m  GEN- NAD, alert and oriented x3 HEENT- PERRL, EOMI, non injected sclera, pink conjunctiva, MMM, oropharynx clear, mild sinus tenderness, nares clear rhinorrhea  Neck- Supple, no LAD CVS- RRR, no murmur RESP-few expiratory wheeze, normal WOB at baseline, congestion bilat  EXT- No edema Pulses- Radial 2+        Assessment & Plan:      Problem List Items Addressed This Visit    None    Visit Diagnoses    Acute bronchitis due to other specified organisms    -  Primary   started viral and has progressed, 2nd illness in past month, now causing asthma to flare, given prednisone ,zpak, robitussin DM, albuterol   Relevant Medications   azithromycin (ZITHROMAX) 250 MG tablet   Mild asthma exacerbation       Relevant Medications   predniSONE (DELTASONE) 20 MG tablet      Note: This dictation was prepared with Dragon dictation along with smaller phrase technology. Any  transcriptional errors that result from this process are unintentional.

## 2018-01-16 ENCOUNTER — Encounter: Payer: Self-pay | Admitting: Family Medicine

## 2018-01-21 ENCOUNTER — Other Ambulatory Visit: Payer: Self-pay | Admitting: Family Medicine

## 2018-01-31 ENCOUNTER — Telehealth: Payer: Self-pay | Admitting: *Deleted

## 2018-01-31 ENCOUNTER — Other Ambulatory Visit: Payer: Self-pay | Admitting: *Deleted

## 2018-01-31 MED ORDER — MECLIZINE HCL 12.5 MG PO TABS: ORAL_TABLET | ORAL | 0 refills | Status: AC

## 2018-01-31 NOTE — Telephone Encounter (Signed)
Received call from patient.   Reports that she was seen earlier in the month and was noted to have bronchitis. States that she has not improved and requested ABTx.   MD please advise.

## 2018-02-01 ENCOUNTER — Other Ambulatory Visit: Payer: Self-pay | Admitting: *Deleted

## 2018-02-01 DIAGNOSIS — R059 Cough, unspecified: Secondary | ICD-10-CM

## 2018-02-01 DIAGNOSIS — R05 Cough: Secondary | ICD-10-CM

## 2018-02-01 NOTE — Telephone Encounter (Signed)
Call placed to patient. [place].

## 2018-02-01 NOTE — Telephone Encounter (Signed)
Needs CXR, already had antibiotics May have just residual cough  She can then schedule OV

## 2018-02-01 NOTE — Telephone Encounter (Signed)
Patient returned call and made aware.   CXR ordered at [facility].

## 2018-02-02 ENCOUNTER — Ambulatory Visit (HOSPITAL_COMMUNITY)
Admission: RE | Admit: 2018-02-02 | Discharge: 2018-02-02 | Disposition: A | Discharge status: Home / Self Care | Payer: Medicare Other | Source: Ambulatory Visit | Attending: Family Medicine | Admitting: Family Medicine

## 2018-02-02 DIAGNOSIS — R05 Cough: Secondary | ICD-10-CM | POA: Diagnosis not present

## 2018-02-02 DIAGNOSIS — R0602 Shortness of breath: Secondary | ICD-10-CM | POA: Diagnosis not present

## 2018-02-02 DIAGNOSIS — J984 Other disorders of lung: Secondary | ICD-10-CM | POA: Insufficient documentation

## 2018-02-02 DIAGNOSIS — R059 Cough, unspecified: Secondary | ICD-10-CM

## 2018-02-21 DIAGNOSIS — F6381 Intermittent explosive disorder: Secondary | ICD-10-CM | POA: Diagnosis not present

## 2018-02-23 ENCOUNTER — Other Ambulatory Visit: Payer: Self-pay | Admitting: Family Medicine

## 2018-03-15 ENCOUNTER — Encounter: Payer: Medicare Other | Admitting: Family Medicine

## 2018-03-21 DIAGNOSIS — F4010 Social phobia, unspecified: Secondary | ICD-10-CM | POA: Diagnosis not present

## 2018-03-21 DIAGNOSIS — F259 Schizoaffective disorder, unspecified: Secondary | ICD-10-CM | POA: Diagnosis not present

## 2018-03-21 DIAGNOSIS — F329 Major depressive disorder, single episode, unspecified: Secondary | ICD-10-CM | POA: Diagnosis not present

## 2018-03-21 DIAGNOSIS — F4001 Agoraphobia with panic disorder: Secondary | ICD-10-CM | POA: Diagnosis not present

## 2018-03-21 DIAGNOSIS — Z5181 Encounter for therapeutic drug level monitoring: Secondary | ICD-10-CM | POA: Diagnosis not present

## 2018-03-25 ENCOUNTER — Other Ambulatory Visit: Payer: Self-pay | Admitting: Family Medicine

## 2018-04-25 DIAGNOSIS — F6381 Intermittent explosive disorder: Secondary | ICD-10-CM | POA: Diagnosis not present

## 2018-04-26 DIAGNOSIS — F6381 Intermittent explosive disorder: Secondary | ICD-10-CM | POA: Diagnosis not present

## 2018-04-27 ENCOUNTER — Other Ambulatory Visit: Payer: Self-pay | Admitting: Family Medicine

## 2018-05-23 DIAGNOSIS — F6381 Intermittent explosive disorder: Secondary | ICD-10-CM | POA: Diagnosis not present

## 2018-05-30 DIAGNOSIS — F6381 Intermittent explosive disorder: Secondary | ICD-10-CM | POA: Diagnosis not present

## 2018-05-31 ENCOUNTER — Other Ambulatory Visit: Payer: Self-pay | Admitting: Family Medicine

## 2018-06-08 DIAGNOSIS — F6381 Intermittent explosive disorder: Secondary | ICD-10-CM | POA: Diagnosis not present

## 2018-06-10 ENCOUNTER — Other Ambulatory Visit: Payer: Self-pay | Admitting: Family Medicine

## 2018-06-10 DIAGNOSIS — B372 Candidiasis of skin and nail: Secondary | ICD-10-CM

## 2018-06-14 DIAGNOSIS — F6381 Intermittent explosive disorder: Secondary | ICD-10-CM | POA: Diagnosis not present

## 2018-06-16 ENCOUNTER — Other Ambulatory Visit: Payer: Self-pay | Admitting: Family Medicine

## 2018-06-21 DIAGNOSIS — F6381 Intermittent explosive disorder: Secondary | ICD-10-CM | POA: Diagnosis not present

## 2018-06-27 DIAGNOSIS — F6381 Intermittent explosive disorder: Secondary | ICD-10-CM | POA: Diagnosis not present

## 2018-06-28 ENCOUNTER — Other Ambulatory Visit: Payer: Self-pay | Admitting: Family Medicine

## 2018-06-28 DIAGNOSIS — F6381 Intermittent explosive disorder: Secondary | ICD-10-CM | POA: Diagnosis not present

## 2018-06-30 ENCOUNTER — Ambulatory Visit (INDEPENDENT_AMBULATORY_CARE_PROVIDER_SITE_OTHER): Payer: Medicare Other | Admitting: Otolaryngology

## 2018-06-30 DIAGNOSIS — R42 Dizziness and giddiness: Secondary | ICD-10-CM

## 2018-06-30 DIAGNOSIS — H93293 Other abnormal auditory perceptions, bilateral: Secondary | ICD-10-CM | POA: Diagnosis not present

## 2018-07-03 DIAGNOSIS — F6381 Intermittent explosive disorder: Secondary | ICD-10-CM | POA: Diagnosis not present

## 2018-07-10 DIAGNOSIS — F6381 Intermittent explosive disorder: Secondary | ICD-10-CM | POA: Diagnosis not present

## 2018-07-17 DIAGNOSIS — F6381 Intermittent explosive disorder: Secondary | ICD-10-CM | POA: Diagnosis not present

## 2018-07-24 DIAGNOSIS — F6381 Intermittent explosive disorder: Secondary | ICD-10-CM | POA: Diagnosis not present

## 2018-07-25 DIAGNOSIS — M1711 Unilateral primary osteoarthritis, right knee: Secondary | ICD-10-CM | POA: Diagnosis not present

## 2018-07-25 DIAGNOSIS — F6381 Intermittent explosive disorder: Secondary | ICD-10-CM | POA: Diagnosis not present

## 2018-07-31 DIAGNOSIS — F6381 Intermittent explosive disorder: Secondary | ICD-10-CM | POA: Diagnosis not present

## 2018-08-01 ENCOUNTER — Ambulatory Visit: Payer: Medicare Other | Admitting: Family Medicine

## 2018-08-02 ENCOUNTER — Other Ambulatory Visit: Payer: Self-pay | Admitting: Family Medicine

## 2018-08-14 DIAGNOSIS — F6381 Intermittent explosive disorder: Secondary | ICD-10-CM | POA: Diagnosis not present

## 2018-08-21 DIAGNOSIS — F6381 Intermittent explosive disorder: Secondary | ICD-10-CM | POA: Diagnosis not present

## 2018-08-22 DIAGNOSIS — F6381 Intermittent explosive disorder: Secondary | ICD-10-CM | POA: Diagnosis not present

## 2018-08-28 DIAGNOSIS — F6381 Intermittent explosive disorder: Secondary | ICD-10-CM | POA: Diagnosis not present

## 2018-08-31 ENCOUNTER — Other Ambulatory Visit: Payer: Self-pay | Admitting: Family Medicine

## 2018-09-04 DIAGNOSIS — F6381 Intermittent explosive disorder: Secondary | ICD-10-CM | POA: Diagnosis not present

## 2018-09-11 DIAGNOSIS — F6381 Intermittent explosive disorder: Secondary | ICD-10-CM | POA: Diagnosis not present

## 2018-09-16 ENCOUNTER — Encounter: Payer: Self-pay | Admitting: Family Medicine

## 2018-09-16 ENCOUNTER — Ambulatory Visit (INDEPENDENT_AMBULATORY_CARE_PROVIDER_SITE_OTHER): Payer: Medicare Other | Admitting: Family Medicine

## 2018-09-16 ENCOUNTER — Other Ambulatory Visit: Payer: Self-pay

## 2018-09-16 VITALS — BP 128/68 | HR 90 | Temp 98.1°F | Resp 16 | Ht 65.0 in | Wt >= 6400 oz

## 2018-09-16 DIAGNOSIS — R7303 Prediabetes: Secondary | ICD-10-CM

## 2018-09-16 DIAGNOSIS — E782 Mixed hyperlipidemia: Secondary | ICD-10-CM | POA: Diagnosis not present

## 2018-09-16 DIAGNOSIS — I10 Essential (primary) hypertension: Secondary | ICD-10-CM

## 2018-09-16 DIAGNOSIS — Z0000 Encounter for general adult medical examination without abnormal findings: Secondary | ICD-10-CM | POA: Diagnosis not present

## 2018-09-16 DIAGNOSIS — R7309 Other abnormal glucose: Secondary | ICD-10-CM | POA: Diagnosis not present

## 2018-09-16 DIAGNOSIS — Z1239 Encounter for other screening for malignant neoplasm of breast: Secondary | ICD-10-CM | POA: Diagnosis not present

## 2018-09-16 DIAGNOSIS — Z124 Encounter for screening for malignant neoplasm of cervix: Secondary | ICD-10-CM | POA: Diagnosis not present

## 2018-09-16 DIAGNOSIS — Z114 Encounter for screening for human immunodeficiency virus [HIV]: Secondary | ICD-10-CM | POA: Diagnosis not present

## 2018-09-16 MED ORDER — LIRAGLUTIDE -WEIGHT MANAGEMENT 18 MG/3ML ~~LOC~~ SOPN: 0.6000 mg | PEN_INJECTOR | Freq: Every day | SUBCUTANEOUS | 3 refills | Status: DC

## 2018-09-16 NOTE — Progress Notes (Signed)
Subjective:   Patient presents for Medicare Annual/Subsequent preventive examination.   Pt here to f/u chronic medical problems  She has some urgency foryears, will leak if she coughs or sneezes or doesn't get to bathroom fast enough, wears poise pad  No change in bowels, no constipation   Continues to gain weight, states she has a problem with sugars  Overdue for PAP Smear     She is caring for her mother, who has a lot of health problems   Review Past Medical/Family/Social: Per EMR    Risk Factors  Current exercise habits: None Dietary issues discussed: Yes  Cardiac risk factors: Obesity (BMI >= 30 kg/m2).   Depression Screen  (Note: if answer to either of the following is "Yes", a more complete depression screening is indicated)  Over the past two weeks, have you felt down, depressed or hopeless? No Over the past two weeks, have you felt little interest or pleasure in doing things? No Have you lost interest or pleasure in daily life? No Do you often feel hopeless? No Do you cry easily over simple problems? No   Activities of Daily Living  In your present state of health, do you have any difficulty performing the following activities?:  Driving? No  Managing money? No  Feeding yourself? No  Getting from bed to chair? No  Climbing a flight of stairs? yes Preparing food and eating?: No  Bathing or showering? No  Getting dressed: No  Getting to the toilet? No  Using the toilet:No  Moving around from place to place: sometimes  In the past year have you fallen or had a near fall?:No  Are you sexually active? No  Do you have more than one partner? No   Hearing Difficulties: No  Do you often ask people to speak up or repeat themselves? No  Do you experience ringing or noises in your ears? No Do you have difficulty understanding soft or whispered voices? No  Do you feel that you have a problem with memory? yes Do you often misplace items? Sometimes   Do you feel safe at  home? Yes  Cognitive Testing  Alert? Yes Normal Appearance?Yes  Oriented to person? Yes Place? Yes  Time? Yes  Recall of three objects? Yes  Can perform simple calculations? Yes  Displays appropriate judgment?Yes  Can read the correct time from a watch face?Yes   List the Names of Other Physician/Practitioners you currently use:   Psychiatry  Screening Tests / Date                    Mammogram  - Due  Influenza Vaccine  - Due PAP SMEAR- over due  Tetanus/tdap  - not covered   ROS: GEN- denies fatigue, fever, weight loss,weakness, recent illness HEENT- denies eye drainage, change in vision, nasal discharge, CVS- denies chest pain, palpitations RESP- denies SOB, cough, wheeze ABD- denies N/V, change in stools, abd pain GU- denies dysuria, hematuria, dribbling, incontinence MSK- denies joint pain, muscle aches, injury Neuro- denies headache, dizziness, syncope, seizure activity  Physical: Vital reviewed ,morbidly obese  GEN- NAD, alert and oriented x3 HEENT- PERRL, EOMI, non injected sclera, pink conjunctiva, MMM, oropharynx clear Neck- Supple, no thryomegaly CVS- RRR, no murmur RESP-CTAB ABD-NABS,soft,NT  Psych- normal affect and mood  EXT- No edema Pulses- Radial, DP- 2+    Assessment:    Annual wellness medicare exam   Plan:    During the course of the visit the patient was educated and  counseled about appropriate screening and preventive services including:   Due for mammogram will have done at [facility] center in [place]  Referral to GYN due to habitus for PAP Smear  Immunizations- TDAP sent to pharmacy    Morbidly obese- insurance will not cover saxenda, with her psych meds would not do contrave or phentermine, discussed weight loss surgery she states she has to take care of her mother. Can not afford dietician due to copay  we have discussed portion control, sugar intake many times   Glucose intolerance in setting of obesity, check A1C, TSH, lipid panel     HTN- controlled no changes           .  Diet review for nutrition referral? Yes ____ Not Indicated __x__  Patient Instructions (the written plan) was given to the patient.  Medicare Attestation  I have personally reviewed:  The patient's medical and social history  Their use of alcohol, tobacco or illicit drugs  Their current medications and supplements  The patient's functional ability including ADLs,fall risks, home safety risks, cognitive, and hearing and visual impairment  Diet and physical activities  Evidence for depression or mood disorders  The patient's weight, height, BMI, and visual acuity have been recorded in the chart. I have made referrals, counseling, and provided education to the patient based on review of the above and I have provided the patient with a written personalized care plan for preventive services.

## 2018-09-16 NOTE — Patient Instructions (Addendum)
Schedule your mammogram - Tea to pharmacy Referral to GYN for PAP Smear  We will call with lab results  Work on low sugar, no SODA, no Candies and Cookies F/U 4 months

## 2018-09-17 ENCOUNTER — Encounter: Payer: Self-pay | Admitting: Family Medicine

## 2018-09-17 LAB — TSH: TSH: 1.74 m[IU]/L

## 2018-09-17 LAB — COMPREHENSIVE METABOLIC PANEL
AG Ratio: 1.3 (calc) (ref 1.0–2.5)
ALBUMIN MSPROF: 3.9 g/dL (ref 3.6–5.1)
ALT: 15 U/L (ref 6–29)
AST: 15 U/L (ref 10–30)
Alkaline phosphatase (APISO): 119 U/L — ABNORMAL HIGH (ref 33–115)
BUN/Creatinine Ratio: 10 (calc) (ref 6–22)
BUN: 6 mg/dL — AB (ref 7–25)
CO2: 26 mmol/L (ref 20–32)
CREATININE: 0.59 mg/dL (ref 0.50–1.10)
Calcium: 9.4 mg/dL (ref 8.6–10.2)
Chloride: 102 mmol/L (ref 98–110)
Globulin: 2.9 g/dL (calc) (ref 1.9–3.7)
Glucose, Bld: 96 mg/dL (ref 65–99)
POTASSIUM: 4.2 mmol/L (ref 3.5–5.3)
SODIUM: 139 mmol/L (ref 135–146)
TOTAL PROTEIN: 6.8 g/dL (ref 6.1–8.1)
Total Bilirubin: 0.5 mg/dL (ref 0.2–1.2)

## 2018-09-17 LAB — LIPID PANEL
CHOL/HDL RATIO: 4.6 (calc) (ref <5.0)
CHOLESTEROL: 221 mg/dL — AB (ref <200)
HDL: 48 mg/dL — ABNORMAL LOW (ref >50)
LDL CHOLESTEROL (CALC): 153 mg/dL — AB
Non-HDL Cholesterol (Calc): 173 mg/dL (calc) — ABNORMAL HIGH (ref <130)
Triglycerides: 90 mg/dL (ref <150)

## 2018-09-17 LAB — CBC WITH DIFFERENTIAL/PLATELET
Absolute Monocytes: 912 cells/uL (ref 200–950)
Basophils Absolute: 46 cells/uL (ref 0–200)
Basophils Relative: 0.4 %
EOS PCT: 0.9 %
Eosinophils Absolute: 103 cells/uL (ref 15–500)
HCT: 37.3 % (ref 35.0–45.0)
Hemoglobin: 11.8 g/dL (ref 11.7–15.5)
Lymphs Abs: 2747 cells/uL (ref 850–3900)
MCH: 26 pg — ABNORMAL LOW (ref 27.0–33.0)
MCHC: 31.6 g/dL — ABNORMAL LOW (ref 32.0–36.0)
MCV: 82.3 fL (ref 80.0–100.0)
MPV: 9.9 fL (ref 7.5–12.5)
Monocytes Relative: 8 %
NEUTROS PCT: 66.6 %
Neutro Abs: 7592 cells/uL (ref 1500–7800)
PLATELETS: 464 10*3/uL — AB (ref 140–400)
RBC: 4.53 10*6/uL (ref 3.80–5.10)
RDW: 14.5 % (ref 11.0–15.0)
TOTAL LYMPHOCYTE: 24.1 %
WBC: 11.4 10*3/uL — AB (ref 3.8–10.8)

## 2018-09-17 LAB — HEMOGLOBIN A1C
EAG (MMOL/L): 6.3 (calc)
Hgb A1c MFr Bld: 5.6 % of total Hgb (ref <5.7)
Mean Plasma Glucose: 114 (calc)

## 2018-09-17 LAB — HIV ANTIBODY (ROUTINE TESTING W REFLEX): HIV 1&2 Ab, 4th Generation: NONREACTIVE

## 2018-09-18 DIAGNOSIS — F6381 Intermittent explosive disorder: Secondary | ICD-10-CM | POA: Diagnosis not present

## 2018-09-19 ENCOUNTER — Other Ambulatory Visit: Payer: Self-pay | Admitting: *Deleted

## 2018-09-21 ENCOUNTER — Other Ambulatory Visit: Payer: Self-pay | Admitting: *Deleted

## 2018-09-21 MED ORDER — PRAVASTATIN SODIUM 20 MG PO TABS: 20.0000 mg | ORAL_TABLET | Freq: Every day | ORAL | 3 refills | Status: DC

## 2018-09-25 DIAGNOSIS — F6381 Intermittent explosive disorder: Secondary | ICD-10-CM | POA: Diagnosis not present

## 2018-09-26 DIAGNOSIS — F6381 Intermittent explosive disorder: Secondary | ICD-10-CM | POA: Diagnosis not present

## 2018-09-28 ENCOUNTER — Encounter (INDEPENDENT_AMBULATORY_CARE_PROVIDER_SITE_OTHER): Payer: Self-pay | Admitting: Orthopaedic Surgery

## 2018-09-28 ENCOUNTER — Ambulatory Visit (INDEPENDENT_AMBULATORY_CARE_PROVIDER_SITE_OTHER): Payer: Medicare Other

## 2018-09-28 ENCOUNTER — Ambulatory Visit (INDEPENDENT_AMBULATORY_CARE_PROVIDER_SITE_OTHER): Payer: Medicare Other | Admitting: Orthopaedic Surgery

## 2018-09-28 VITALS — BP 127/82 | HR 82 | Ht 65.0 in | Wt >= 6400 oz

## 2018-09-28 DIAGNOSIS — M79672 Pain in left foot: Secondary | ICD-10-CM

## 2018-09-28 NOTE — Progress Notes (Signed)
Office Visit Note   Patient: Jasmin Castro           Date of Birth: 08/21/1976           MRN: [id number] Visit Date: 09/28/2018              Requested by: Alycia Rossetti, MD [address] [place], [place] [address] PCP: Alycia Rossetti, MD   Assessment & Plan: Visit Diagnoses:  1. Pain in left foot     Plan: Contusion dorsum left foot without evidence of a fracture.  Comfortable shoes .office as needed  Follow-Up Instructions: Return if symptoms worsen or fail to improve.   Orders:  Orders Placed This Encounter  Procedures  . XR Foot Complete Left   No orders of the defined types were placed in this encounter.     Procedures: No procedures performed   Clinical Data: No additional findings.   Subjective: Chief Complaint  Patient presents with  . Left Foot - Pain    DOI 09/25/2018  Patient presents with left foot pain after a ketchup bottle fell landing on her left foot 09/25/2018.  The pain is more over the third and fourth metatarsals. She states that a "lump" came up but she has gotten that to go down after using ice. She still describes some swelling. She is unable to wear a tennis shoe on the foot due to the top being sensitive to touch. She denies painful weightbearing.  She is taking Tylenol as needed with relief.   HPI  Review of Systems   Objective: Vital Signs: BP 127/82   Pulse 82   Ht 5' 5"  (1.651 m)   Wt (!) 411 lb (186.4 kg)   BMI 68.39 kg/m   Physical Exam Constitutional:      Appearance: She is well-developed.  Eyes:     Pupils: Pupils are equal, round, and reactive to light.  Pulmonary:     Effort: Pulmonary effort is normal.  Skin:    General: Skin is warm and dry.  Neurological:     Mental Status: She is alert and oriented to person, place, and time.  Psychiatric:        Behavior: Behavior normal.     Ortho Exam awake alert and oriented x3.  Comfortable sitting.  No swelling of left foot.  Skin intact.  Neurologically  intact.  Some area of tenderness in the area of the mid third and fourth metatarsals.  No crepitation.  No ankle pain or hindfoot discomfort  Specialty Comments:  No specialty comments available.  Imaging: Xr Foot Complete Left  Result Date: 09/28/2018 Pain in several projections.  No acute changes.  No evidence of arthritis subluxation or dislocation relative to the injury she sustained several days ago when she dropped a bottle of catch up on her foot    PMFS History: Patient Active Problem List   Diagnosis Date Noted  . Metabolic syndrome X 38/25/0539  . Peripheral edema 10/13/2013  . Urge incontinence 07/19/2013  . Vertigo 01/22/2013  . OA (osteoarthritis) of knee 01/22/2013  . GERD (gastroesophageal reflux disease) 01/22/2013  . Achilles tendinitis of right lower extremity 11/14/2012  . Prediabetes 07/18/2012  . Morbid obesity ([place]) 07/18/2012  . Hyperlipidemia 07/18/2012  . Hypertension 07/18/2012  . Depression 07/18/2012  . GAD (generalized anxiety disorder) 07/18/2012   Past Medical History:  Diagnosis Date  . Asthma   . Depression   . Hyperlipemia   . Hypertension   .  Obesity   . Panic attacks   . Paranoid ([place])   . PCOS (polycystic ovarian syndrome)   . Prediabetes   . Urge incontinence 07/19/2013    Family History  Problem Relation Age of Onset  . Hyperlipidemia Mother   . Hyperlipidemia Father   . Cancer Father        colon and prostate , brain tumor  . Diabetes Sister        prediatic  . Hyperlipidemia Brother   . Hypertension Brother     Past Surgical History:  Procedure Laterality Date  . HYSTEROSCOPY DIAGNOSTIC    . insulin resistant     Social History   Occupational History  . Not on file  Tobacco Use  . Smoking status: Never Smoker  . Smokeless tobacco: Never Used  Substance and Sexual Activity  . Alcohol use: No  . Drug use: No  . Sexual activity: Never    Birth control/protection: None

## 2018-10-02 DIAGNOSIS — F6381 Intermittent explosive disorder: Secondary | ICD-10-CM | POA: Diagnosis not present

## 2018-10-03 ENCOUNTER — Other Ambulatory Visit: Payer: Self-pay | Admitting: Family Medicine

## 2018-10-09 DIAGNOSIS — F6381 Intermittent explosive disorder: Secondary | ICD-10-CM | POA: Diagnosis not present

## 2018-10-16 DIAGNOSIS — F6381 Intermittent explosive disorder: Secondary | ICD-10-CM | POA: Diagnosis not present

## 2018-10-23 DIAGNOSIS — F6381 Intermittent explosive disorder: Secondary | ICD-10-CM | POA: Diagnosis not present

## 2018-10-24 DIAGNOSIS — F6381 Intermittent explosive disorder: Secondary | ICD-10-CM | POA: Diagnosis not present

## 2018-10-28 ENCOUNTER — Telehealth: Payer: Self-pay | Admitting: Family Medicine

## 2018-10-28 NOTE — Telephone Encounter (Signed)
Advised patient to take pravastatin three times a week and take coenzyme Q 10 daily to help with joint pain.

## 2018-10-28 NOTE — Telephone Encounter (Signed)
Pt was having side effects of her pravastatin it is causing joint pain, she has taken it upon herself to stop the medication so she has not been using anything.

## 2018-10-28 NOTE — Telephone Encounter (Signed)
Have her take three times a week with coenzyme Q 10 ( 111m) this should stop the joint pain

## 2018-10-30 DIAGNOSIS — F6381 Intermittent explosive disorder: Secondary | ICD-10-CM | POA: Diagnosis not present

## 2018-11-01 ENCOUNTER — Other Ambulatory Visit: Payer: Self-pay | Admitting: Family Medicine

## 2018-11-06 DIAGNOSIS — F6381 Intermittent explosive disorder: Secondary | ICD-10-CM | POA: Diagnosis not present

## 2018-11-10 DIAGNOSIS — Z1231 Encounter for screening mammogram for malignant neoplasm of breast: Secondary | ICD-10-CM | POA: Diagnosis not present

## 2018-11-10 LAB — HM MAMMOGRAPHY

## 2018-11-11 ENCOUNTER — Encounter: Payer: Self-pay | Admitting: *Deleted

## 2018-11-13 DIAGNOSIS — F6381 Intermittent explosive disorder: Secondary | ICD-10-CM | POA: Diagnosis not present

## 2018-11-16 ENCOUNTER — Encounter: Payer: Self-pay | Admitting: Advanced Practice Midwife

## 2018-11-20 DIAGNOSIS — F6381 Intermittent explosive disorder: Secondary | ICD-10-CM | POA: Diagnosis not present

## 2018-11-27 DIAGNOSIS — F6381 Intermittent explosive disorder: Secondary | ICD-10-CM | POA: Diagnosis not present

## 2018-11-28 ENCOUNTER — Other Ambulatory Visit: Payer: Self-pay | Admitting: Family Medicine

## 2018-12-11 DIAGNOSIS — F6381 Intermittent explosive disorder: Secondary | ICD-10-CM | POA: Diagnosis not present

## 2018-12-21 ENCOUNTER — Other Ambulatory Visit: Payer: Self-pay | Admitting: Family Medicine

## 2018-12-25 DIAGNOSIS — F6381 Intermittent explosive disorder: Secondary | ICD-10-CM | POA: Diagnosis not present

## 2018-12-26 DIAGNOSIS — F6381 Intermittent explosive disorder: Secondary | ICD-10-CM | POA: Diagnosis not present

## 2018-12-26 NOTE — Telephone Encounter (Signed)
noted 

## 2018-12-26 NOTE — Telephone Encounter (Signed)
Received VM from patient.    Reports that she has really tried to take Pravastatin with Co Q 10, but myalgias and HA continue. States that she has stopped taking both medications. MD to be made aware.

## 2018-12-29 ENCOUNTER — Encounter: Payer: Medicare Other | Admitting: Adult Health

## 2019-01-08 DIAGNOSIS — F6381 Intermittent explosive disorder: Secondary | ICD-10-CM | POA: Diagnosis not present

## 2019-01-15 DIAGNOSIS — F6381 Intermittent explosive disorder: Secondary | ICD-10-CM | POA: Diagnosis not present

## 2019-01-16 ENCOUNTER — Ambulatory Visit: Payer: Medicare Other | Admitting: Family Medicine

## 2019-01-16 ENCOUNTER — Encounter: Payer: Self-pay | Admitting: Family Medicine

## 2019-01-16 ENCOUNTER — Ambulatory Visit (INDEPENDENT_AMBULATORY_CARE_PROVIDER_SITE_OTHER): Payer: Medicare Other | Admitting: Family Medicine

## 2019-01-16 ENCOUNTER — Other Ambulatory Visit: Payer: Self-pay

## 2019-01-16 DIAGNOSIS — E782 Mixed hyperlipidemia: Secondary | ICD-10-CM | POA: Diagnosis not present

## 2019-01-16 DIAGNOSIS — I10 Essential (primary) hypertension: Secondary | ICD-10-CM

## 2019-01-16 DIAGNOSIS — M170 Bilateral primary osteoarthritis of knee: Secondary | ICD-10-CM | POA: Diagnosis not present

## 2019-01-16 MED ORDER — FENOFIBRATE 48 MG PO TABS: 48.0000 mg | ORAL_TABLET | Freq: Every day | ORAL | 2 refills | Status: DC

## 2019-01-16 NOTE — Progress Notes (Signed)
Virtual Visit via Telephone Note  I connected with Jasmin Castro on 01/16/19 at 11:42am  by telephone and verified that I am speaking with the correct person using two identifiers. Pt location: at home Physician location: at home, Vic Blackbird MD, [facility]   I discussed the limitations, risks, security and privacy concerns of performing an evaluation and management service by telephone and the availability of in person appointments. I also discussed with the patient that there may be a patient responsible charge related to this service. The patient expressed understanding and agreed to proceed.   History of Present Illness:  Hyperlipidemia-  States she stopped the stain ( pravastatin) drug due joint pain mostly in her knees, tried the Coenzyme gave her side effects  HTN- states she checked it a few times at home, states it has been around 130's she tinks, she taking enalapril   Morbid Obesity- admits to eating more, Saxenda was not covered.  She is not exercising.  Unsure how much she actually weighs  OA knees- unable to get to [facility] . Has been taking ibuprofen 644m twice a day     Observations/Objective: No acute distress noted over phone  Assessment and Plan: Hyperlipidemia-try her on Tricor 451monce a day just at this does not have any decreasing cardiovascular risk but will help with her overall cholesterol numbers.  HTN-continue current blood pressure medication.  Morbid Obesity-has been a continuous problem with her eating habits and increasing weight throughout the years.  I do not know much more readily we can give her her at this time.  We discussed weight loss surgery but she is not interested.  Is already followed by psychiatry and therapist.  OA  of the knee she can continue the ibuprofen until she is able to see her orthopedic  Follow Up Instructions: F/U 2 months will repeat labs    I discussed the assessment and treatment plan with the  patient. The patient was provided an opportunity to ask questions and all were answered. The patient agreed with the plan and demonstrated an understanding of the instructions.   The patient was advised to call back or seek an in-person evaluation if the symptoms worsen or if the condition fails to improve as anticipated.  I provided 9 minutes of non-face-to-face time during this encounter. End time 11:51am   KaVic BlackbirdMD

## 2019-01-18 ENCOUNTER — Telehealth: Payer: Self-pay

## 2019-01-18 NOTE — Telephone Encounter (Signed)
PA submitted via CMM for Fenofibrate 48MG tablets. Waiting on reply.

## 2019-01-19 NOTE — Telephone Encounter (Signed)
PA was denied. Do you want to appeal or send in a different Rx?  Fenofibrate 83m tablet is denied because it is not on your plan's Drug List (formulary). You need to first try two (2) of these covered drugs: (a) Fenofibrate 564mor 16069mgeneric [place](b) Gemfibrozil. OR your doctor needs to give us [place] medical reasons why two (2) of the covered drugs are not appropriate for you. Reviewed by: DLV, Pharm.D.

## 2019-01-20 MED ORDER — FENOFIBRATE 54 MG PO TABS: 54.0000 mg | ORAL_TABLET | Freq: Every day | ORAL | 0 refills | Status: DC

## 2019-01-20 NOTE — Telephone Encounter (Signed)
Change to fenofibrate 64m once a day instead

## 2019-01-20 NOTE — Telephone Encounter (Signed)
Rx sent to pharmacy   

## 2019-01-23 DIAGNOSIS — F6381 Intermittent explosive disorder: Secondary | ICD-10-CM | POA: Diagnosis not present

## 2019-01-29 DIAGNOSIS — F6381 Intermittent explosive disorder: Secondary | ICD-10-CM | POA: Diagnosis not present

## 2019-02-20 DIAGNOSIS — F6381 Intermittent explosive disorder: Secondary | ICD-10-CM | POA: Diagnosis not present

## 2019-03-20 ENCOUNTER — Ambulatory Visit: Payer: Medicare Other | Admitting: Family Medicine

## 2019-03-28 ENCOUNTER — Encounter: Payer: Self-pay | Admitting: Family Medicine

## 2019-03-28 ENCOUNTER — Other Ambulatory Visit: Payer: Self-pay

## 2019-03-28 ENCOUNTER — Ambulatory Visit (INDEPENDENT_AMBULATORY_CARE_PROVIDER_SITE_OTHER): Payer: Medicare Other | Admitting: Family Medicine

## 2019-03-28 ENCOUNTER — Ambulatory Visit: Payer: Medicare Other | Admitting: Family Medicine

## 2019-03-28 ENCOUNTER — Telehealth: Payer: Self-pay

## 2019-03-28 DIAGNOSIS — J069 Acute upper respiratory infection, unspecified: Secondary | ICD-10-CM

## 2019-03-28 DIAGNOSIS — R197 Diarrhea, unspecified: Secondary | ICD-10-CM

## 2019-03-28 DIAGNOSIS — Z20822 Contact with and (suspected) exposure to covid-19: Secondary | ICD-10-CM

## 2019-03-28 MED ORDER — ZYRTEC ALLERGY 10 MG PO TBDP: 10.0000 mg | ORAL_TABLET | Freq: Every day | ORAL | 0 refills | Status: AC

## 2019-03-28 MED ORDER — ALBUTEROL SULFATE HFA 108 (90 BASE) MCG/ACT IN AERS: INHALATION_SPRAY | RESPIRATORY_TRACT | 3 refills | Status: DC

## 2019-03-28 MED ORDER — MONTELUKAST SODIUM 10 MG PO TABS: 10.0000 mg | ORAL_TABLET | Freq: Every day | ORAL | 3 refills | Status: DC

## 2019-03-28 NOTE — Addendum Note (Signed)
Addended by: Matilde Sprang on: 03/28/2019 04:55 PM   Modules accepted: Orders

## 2019-03-28 NOTE — Telephone Encounter (Signed)
rec'd referral from [facility] for scheduling COVID testing, from Delsa Grana, [place].  Attempted to call pt. @ [phone].  No answer after multiple rings; no vm option.  Order placed for COVID testing.

## 2019-03-28 NOTE — Telephone Encounter (Signed)
-----   Message from Vanice Sarah, [place] sent at 03/28/2019  3:40 PM EDT ----- Regarding: covid symptoms Dry cough, fatigue, SOB, diarrhea. Pt needs testing. Thanks.

## 2019-03-28 NOTE — Telephone Encounter (Signed)
Patient called to schedule covid testing and she says she talked with her doctor and was told she did not have to be tested.

## 2019-03-28 NOTE — Progress Notes (Signed)
Patient ID: Jasmin Castro, female    DOB: Feb 21, 1976, 43 y.o.   MRN: [id number]  PCP: Alycia Rossetti, MD  Virtual Visit via telephone   Phone visit arranged with [name] for 03/28/19 at 12:00 PM EDT  Services provided today were via telemedicine through telephone call. Start of phone call:  12:58 PM  I verified that I was speaking with the correct person using two identifiers. Patient reported their location during encounter was at home   Patient consented to telephone visit  I conducted telephone visit from [place] [facility]  Referring Provider:   Alycia Rossetti, MD   All participants in encounter:  Myself and the patient   I discussed the limitations, risks, security and privacy concerns of performing an evaluation and management service by telephone and the availability of in person appointments. I also discussed with the patient that there may be a patient responsible charge related to this service. The patient expressed understanding and agreed to proceed.  No chief complaint on file.   Subjective:   Jasmin Castro is a 43 y.o. female, presents via telephone call with sx of illness.  She has some loose stool yesterday with some intermittent "light winded", nasal drainage, ear fullness, sneezing.  Has hx of asthma, has small exacerbation about 3 weeks ago, but other than nighttime cough, no wheeze, SOB, cough , CP in the last two days.   Temp yesterday and today 98.2.   No associated body aches, sweats, chills.   Patient Active Problem List   Diagnosis Date Noted  . Metabolic syndrome X 95/63/8756  . Peripheral edema 10/13/2013  . Urge incontinence 07/19/2013  . Vertigo 01/22/2013  . OA (osteoarthritis) of knee 01/22/2013  . GERD (gastroesophageal reflux disease) 01/22/2013  . Achilles tendinitis of right lower extremity 11/14/2012  . Prediabetes 07/18/2012  . Morbid obesity ([place]) 07/18/2012  . Hyperlipidemia 07/18/2012  .  Hypertension 07/18/2012  . Depression 07/18/2012  . GAD (generalized anxiety disorder) 07/18/2012    Prior to Admission medications   Medication Sig Start Date End Date Taking? Authorizing Provider  acetaminophen (TYLENOL) 325 MG tablet Take 650 mg by mouth every 6 (six) hours as needed.    [provider]  clotrimazole-betamethasone (LOTRISONE) cream APPLY TOPICALLY TWICE DAILY 06/10/18   Susy Frizzle, MD  enalapril (VASOTEC) 20 MG tablet TAKE ONE TABLET BY MOUTH DAILY. 12/21/18   Alycia Rossetti, MD  fenofibrate 54 MG tablet Take 1 tablet (54 mg total) by mouth daily. 01/20/19   [place], Modena Nunnery, MD  fluticasone (FLONASE) 50 MCG/ACT nasal spray USE 2 SPRAYS IN EACH NOSTRIL ONCE DAILY.  SHAKE GENTLY BEFORE USING. 06/17/18   Alycia Rossetti, MD  ibuprofen (ADVIL,MOTRIN) 200 MG tablet Take 400 mg by mouth every 8 (eight) hours as needed.    [provider]  LORazepam (ATIVAN) 1 MG tablet Take 1 mg by mouth at bedtime.  12/15/13   [provider]  meclizine (ANTIVERT) 12.5 MG tablet TAKE ONE TABLET BY MOUTH THREE TIMES DAILY AS NEEDED FOR DIZZINESS OR NAUSEA 01/31/18   [place], Modena Nunnery, MD  OLANZapine (ZYPREXA) 15 MG tablet Take 15 mg by mouth at bedtime.    [provider]  Omega-3 Fatty Acids (FISH OIL) 1000 MG CAPS Take 1 capsule by mouth daily.    [provider]  PRISTIQ 100 MG 24 hr tablet Take 100 mg by mouth at bedtime.  09/27/12   [provider]  PROAIR HFA 108 (90 Base) MCG/ACT inhaler INHALE TWO PUFFS INTO THE LUNGS EVERY 6 HOURS AS NEEDED FOR FOR WHEEZING AND SHORTNESS OF BREATH. 01/10/18   Alycia Rossetti, MD    Allergies  Allergen Reactions  . Keflex [Cephalexin]     Headache   . Lodine [Etodolac] Other (See Comments)    Dizziness   . Seroquel [Quetiapine Fumarate]     Itching     Review of Systems  Constitutional: Negative.   HENT: Negative.   Eyes: Negative.   Respiratory: Negative.   Cardiovascular:  Negative.   Gastrointestinal: Negative.   Endocrine: Negative.   Genitourinary: Negative.   Musculoskeletal: Negative.   Skin: Negative.   Allergic/Immunologic: Negative.   Neurological: Negative.   Hematological: Negative.   Psychiatric/Behavioral: Negative.   All other systems reviewed and are negative.      Objective:    There were no vitals filed for this visit.    Physical Exam  Limited due to telephone exam Pt alert No audible respiratory distress, wheeze or stridor    Assessment & Plan:     ICD-10-CM   1. Upper respiratory tract infection, unspecified type  J06.9   2. Diarrhea, unspecified type  R19.7     Had some mild GI sx yesterday that resolved, I explained its possible to likely that she has viral illness, advised COVID testing which she refused, advised home quarantine per CDC guidelines. Tx nasal sx with allergy meds and restart singulair.  Refills of inhaler for asthma put through to pharmacy. Instructed to call us/follow up ASAP if she has asthma exacerbation.  Sent link to new updated email for [employer]. Instructions from CDC available through mychart.    I discussed the assessment and treatment plan with the patient. The patient was provided an opportunity to ask questions and all were answered. The patient agreed with the plan and demonstrated an understanding of the instructions.   The patient was advised to call back or seek an in-person evaluation if the symptoms worsen or if the condition fails to improve as anticipated.  Phone call concluded at 1:18 PM  I provided 20 minutes of non-face-to-face time during this encounter.  Delsa Grana, PA-C 03/28/19 12:58 PM

## 2019-03-28 NOTE — Patient Instructions (Signed)
Prevent the Spread of COVID-19 if You Are Sick If you are sick with COVID-19 or think you might have COVID-19, follow the steps below to help protect other people in your home and community. Stay home except to get medical care.  Stay home. Most people with COVID-19 have mild illness and are able to recover at home without medical care. Do not leave your home, except to get medical care. Do not visit public areas.  Take care of yourself. Get rest and stay hydrated.  Get medical care when needed. Call your doctor before you go to their office for care. But, if you have trouble breathing or other concerning symptoms, call 911 for immediate help.  Avoid public transportation, ride-sharing, or taxis. Separate yourself from other people and pets in your home.  As much as possible, stay in a specific room and away from other people and pets in your home. Also, you should use a separate bathroom, if available. If you need to be around other people or animals in or outside of the home, wear a cloth face covering. ? See COVID-19 and Animals if you have questions about pets: https://www.cdc.gov/coronavirus/2019-ncov/faq.html#COVID19animals Monitor your symptoms.  Common symptoms of COVID-19 include fever and cough. Trouble breathing is a more serious symptom that means you should get medical attention.  Follow care instructions from your healthcare provider and local health department. Your local health authorities will give instructions on checking your symptoms and reporting information. If you develop emergency warning signs for COVID-19 get medical attention immediately.  Emergency warning signs include*:  Trouble breathing  Persistent pain or pressure in the chest  New confusion or not able to be woken  Bluish lips or face *This list is not all inclusive. Please consult your medical provider for any other symptoms that are severe or concerning to you. Call 911 if you have a medical  emergency. If you have a medical emergency and need to call 911, notify the operator that you have or think you might have, COVID-19. If possible, put on a facemask before medical help arrives. Call ahead before visiting your doctor.  Call ahead. Many medical visits for routine care are being postponed or done by phone or telemedicine.  If you have a medical appointment that cannot be postponed, call your doctor's office. This will help the office protect themselves and other patients. If you are sick, wear a cloth covering over your nose and mouth.  You should wear a cloth face covering over your nose and mouth if you must be around other people or animals, including pets (even at home).  You don't need to wear the cloth face covering if you are alone. If you can't put on a cloth face covering (because of trouble breathing for example), cover your coughs and sneezes in some other way. Try to stay at least 6 feet away from other people. This will help protect the people around you. Note: During the COVID-19 pandemic, medical grade facemasks are reserved for healthcare workers and some first responders. You may need to make a cloth face covering using a scarf or bandana. Cover your coughs and sneezes.  Cover your mouth and nose with a tissue when you cough or sneeze.  Throw used tissues in a lined trash can.  Immediately wash your hands with soap and water for at least 20 seconds. If soap and water are not available, clean your hands with an alcohol-based hand sanitizer that contains at least 60% alcohol. Clean your hands often.    Wash your hands often with soap and water for at least 20 seconds. This is especially important after blowing your nose, coughing, or sneezing; going to the bathroom; and before eating or preparing food.  Use hand sanitizer if soap and water are not available. Use an alcohol-based hand sanitizer with at least 60% alcohol, covering all surfaces of your hands and rubbing  them together until they feel dry.  Soap and water are the best option, especially if your hands are visibly dirty.  Avoid touching your eyes, nose, and mouth with unwashed hands. Avoid sharing personal household items.  Do not share dishes, drinking glasses, cups, eating utensils, towels, or bedding with other people in your home.  Wash these items thoroughly after using them with soap and water or put them in the dishwasher. Clean all "high-touch" surfaces everyday.  Clean and disinfect high-touch surfaces in your "sick room" and bathroom. Let someone else clean and disinfect surfaces in common areas, but not your bedroom and bathroom.  If a caregiver or other person needs to clean and disinfect a sick person's bedroom or bathroom, they should do so on an as-needed basis. The caregiver/other person should wear a mask and wait as long as possible after the sick person has used the bathroom. High-touch surfaces include phones, remote controls, counters, tabletops, doorknobs, bathroom fixtures, toilets, keyboards, tablets, and bedside tables.  Clean and disinfect areas that may have blood, stool, or body fluids on them.  Use household cleaners and disinfectants. Clean the area or item with soap and water or another detergent if it is dirty. Then use a household disinfectant. ? Be sure to follow the instructions on the label to ensure safe and effective use of the product. Many products recommend keeping the surface wet for several minutes to ensure germs are killed. Many also recommend precautions such as wearing gloves and making sure you have good ventilation during use of the product. ? Most EPA-registered household disinfectants should be effective. How to discontinue home isolation  People with COVID-19 who have stayed home (home isolated) can stop home isolation under the following conditions: ? If you will not have a test to determine if you are still contagious, you can leave home  after these three things have happened:  You have had no fever for at least 72 hours (that is three full days of no fever without the use of medicine that reduces fevers) AND  other symptoms have improved (for example, when your cough or shortness of breath has improved) AND  at least 10 days have passed since your symptoms first appeared. ? If you will be tested to determine if you are still contagious, you can leave home after these three things have happened:  You no longer have a fever (without the use of medicine that reduces fevers) AND  other symptoms have improved (for example, when your cough or shortness of breath has improved) AND  you received two negative tests in a row, 24 hours apart. Your doctor will follow CDC guidelines. In all cases, follow the guidance of your healthcare provider and local health department. The decision to stop home isolation should be made in consultation with your healthcare provider and state and local health departments. Local decisions depend on local circumstances. cdc.gov/coronavirus 01/15/2019 This information is not intended to replace advice given to you by your health care provider. Make sure you discuss any questions you have with your health care provider. Document Released: 12/27/2018 Document Revised: 01/25/2019 Document Reviewed: 12/27/2018   Elsevier Patient Education  2020 [employer]  

## 2019-04-03 ENCOUNTER — Other Ambulatory Visit: Payer: Self-pay | Admitting: Family Medicine

## 2019-04-04 ENCOUNTER — Ambulatory Visit: Payer: Medicare Other | Admitting: Family Medicine

## 2019-04-24 DIAGNOSIS — F6381 Intermittent explosive disorder: Secondary | ICD-10-CM | POA: Diagnosis not present

## 2019-05-29 ENCOUNTER — Ambulatory Visit: Payer: Medicare Other | Admitting: Family Medicine

## 2019-05-29 ENCOUNTER — Ambulatory Visit (INDEPENDENT_AMBULATORY_CARE_PROVIDER_SITE_OTHER): Payer: Medicare Other | Admitting: Family Medicine

## 2019-05-29 ENCOUNTER — Other Ambulatory Visit: Payer: Self-pay

## 2019-05-29 ENCOUNTER — Encounter: Payer: Self-pay | Admitting: Family Medicine

## 2019-05-29 DIAGNOSIS — J069 Acute upper respiratory infection, unspecified: Secondary | ICD-10-CM | POA: Diagnosis not present

## 2019-05-29 DIAGNOSIS — J029 Acute pharyngitis, unspecified: Secondary | ICD-10-CM

## 2019-05-29 MED ORDER — AMOXICILLIN 500 MG PO CAPS: 500.0000 mg | ORAL_CAPSULE | Freq: Two times a day (BID) | ORAL | 0 refills | Status: DC

## 2019-05-29 MED ORDER — PREDNISONE 20 MG PO TABS: 20.0000 mg | ORAL_TABLET | Freq: Every day | ORAL | 0 refills | Status: DC

## 2019-05-29 NOTE — Progress Notes (Signed)
Virtual Visit via Telephone Note  I connected with Jasmin Castro on 05/29/19 at 11:09am by telephone and verified that I am speaking with the correct person using two identifiers.    Pt location: at home   Physician location:  In office, [facility], Vic Blackbird MD     On call: patient and physician     I discussed the limitations, risks, security and privacy concerns of performing an evaluation and management service by telephone and the availability of in person appointments. I also discussed with the patient that there may be a patient responsible charge related to this service. The patient expressed understanding and agreed to proceed.   History of Present Illness:  Sore throat, pain with swallowing, Headache, tonsil stones for past 2 weeks. Has had some chest congestion, she has some productive. No fever, no body aches, no N/V   No known sick contacts  Declines COVID-19  She has been taking claritin and tylenol/ ibuprofen  Has felt occ SOB, used albuterol a few times         Observations/Objective: NAD, noted over phone    Assessment and Plan: Pharyngitis with URI symptoms, history of bronchitis, pt declines COVID testing discussed importance of this with her symptoms, states she is afraid of the test, discussed how testing is done,s he still declined Will send over prednisone and amoxicillin, continue allergy meds, albuterol inhaler  Go to ER if she worsens   Follow Up Instructions:    I discussed the assessment and treatment plan with the patient. The patient was provided an opportunity to ask questions and all were answered. The patient agreed with the plan and demonstrated an understanding of the instructions.   The patient was advised to call back or seek an in-person evaluation if the symptoms worsen or if the condition fails to improve as anticipated.  I provided 5  minutes of non-face-to-face time during this encounter. End time: 11:14am    Vic Blackbird, MD

## 2019-06-26 DIAGNOSIS — F6381 Intermittent explosive disorder: Secondary | ICD-10-CM | POA: Diagnosis not present

## 2019-07-24 DIAGNOSIS — F6381 Intermittent explosive disorder: Secondary | ICD-10-CM | POA: Diagnosis not present

## 2019-07-28 ENCOUNTER — Other Ambulatory Visit: Payer: Self-pay | Admitting: Family Medicine

## 2019-07-28 DIAGNOSIS — B372 Candidiasis of skin and nail: Secondary | ICD-10-CM

## 2019-08-25 ENCOUNTER — Ambulatory Visit (INDEPENDENT_AMBULATORY_CARE_PROVIDER_SITE_OTHER): Payer: Medicare Other | Admitting: Family Medicine

## 2019-08-25 ENCOUNTER — Encounter: Payer: Self-pay | Admitting: Family Medicine

## 2019-08-25 DIAGNOSIS — J4520 Mild intermittent asthma, uncomplicated: Secondary | ICD-10-CM | POA: Diagnosis not present

## 2019-08-25 DIAGNOSIS — J069 Acute upper respiratory infection, unspecified: Secondary | ICD-10-CM | POA: Diagnosis not present

## 2019-08-25 DIAGNOSIS — J45909 Unspecified asthma, uncomplicated: Secondary | ICD-10-CM | POA: Insufficient documentation

## 2019-08-25 DIAGNOSIS — M62838 Other muscle spasm: Secondary | ICD-10-CM

## 2019-08-25 MED ORDER — MONTELUKAST SODIUM 10 MG PO TABS: 10.0000 mg | ORAL_TABLET | Freq: Every day | ORAL | 3 refills | Status: DC

## 2019-08-25 MED ORDER — PREDNISONE 20 MG PO TABS: 40.0000 mg | ORAL_TABLET | Freq: Every day | ORAL | 0 refills | Status: DC

## 2019-08-25 NOTE — Progress Notes (Signed)
Virtual Visit via Telephone Note  I connected with Jasmin Castro on 08/25/19 at 11:27am by telephone and verified that I am speaking with the correct person using two identifiers.      Pt location: at home   Physician location:  In office, [facility], Vic Blackbird MD     On call: patient and physician   I discussed the limitations, risks, security and privacy concerns of performing an evaluation and management service by telephone and the availability of in person appointments. I also discussed with the patient that there may be a patient responsible charge related to this service. The patient expressed understanding and agreed to proceed.   History of Present Illness: Patient did not want to come to the office in the setting of COVID-19 pandemic Pt called in with leg jumping x 2 episodes.   Last week had 2 episodes of trembling or jumpng in her leg, in calf region, no pain associated  No swelling or redness  When she got out of the car and walked as needed  She is on zyprexa, on prsistic , She though it was the claritin  She has cold symptoms for the past week, throat is raspy, SOB when she exerts herself, sneezing, wheezing, fever   she has been taking robitussin , she has not been on singulair, she has been using albuterol, and claritin No GI symptoms   Observations/Objective: No acute distress noted over the phone.  Normal work of breathing heard speaking in full sentences  Assessment and Plan: Leg spasm which she had 2 isolated episodes.  I do not think that this is a side effect of her psychotropic meds at this time.  Advised to make sure that she is staying hydrated.  She does not have any back symptoms associated no symptoms described of infection or effusion.  She does have large habitus so this likely was just typical spasm of the leg may be the position she was in the car.   URI symptoms.  I recommend that she go get Covid testing he declines this stating  that she does not want anything swabbed up her nose.  She asked about antibiotics.  I do not recommend antibiotics at this time she continue with the Robitussin as she does have underlying asthma we will give her prednisone and she can use albuterol you her allergy medications.  I did recommend she go to the nearest emergency room if she cannot breathe she voiced understanding. Follow Up Instructions:    I discussed the assessment and treatment plan with the patient. The patient was provided an opportunity to ask questions and all were answered. The patient agreed with the plan and demonstrated an understanding of the instructions.   The patient was advised to call back or seek an in-person evaluation if the symptoms worsen or if the condition fails to improve as anticipated.  I provided  7 minutes of non-face-to-face time during this encounter. End Time: 11:34am   Vic Blackbird, MD

## 2019-08-31 ENCOUNTER — Other Ambulatory Visit: Payer: Self-pay | Admitting: Family Medicine

## 2019-09-16 IMAGING — DX DG CHEST 2V
2 series · 2 of 2 positions shown · non-contrast
Comparison: June 17, 2012

CLINICAL DATA: Shortness of breath with cough and congestion

EXAM:
CHEST - 2 VIEW

[chest pa]
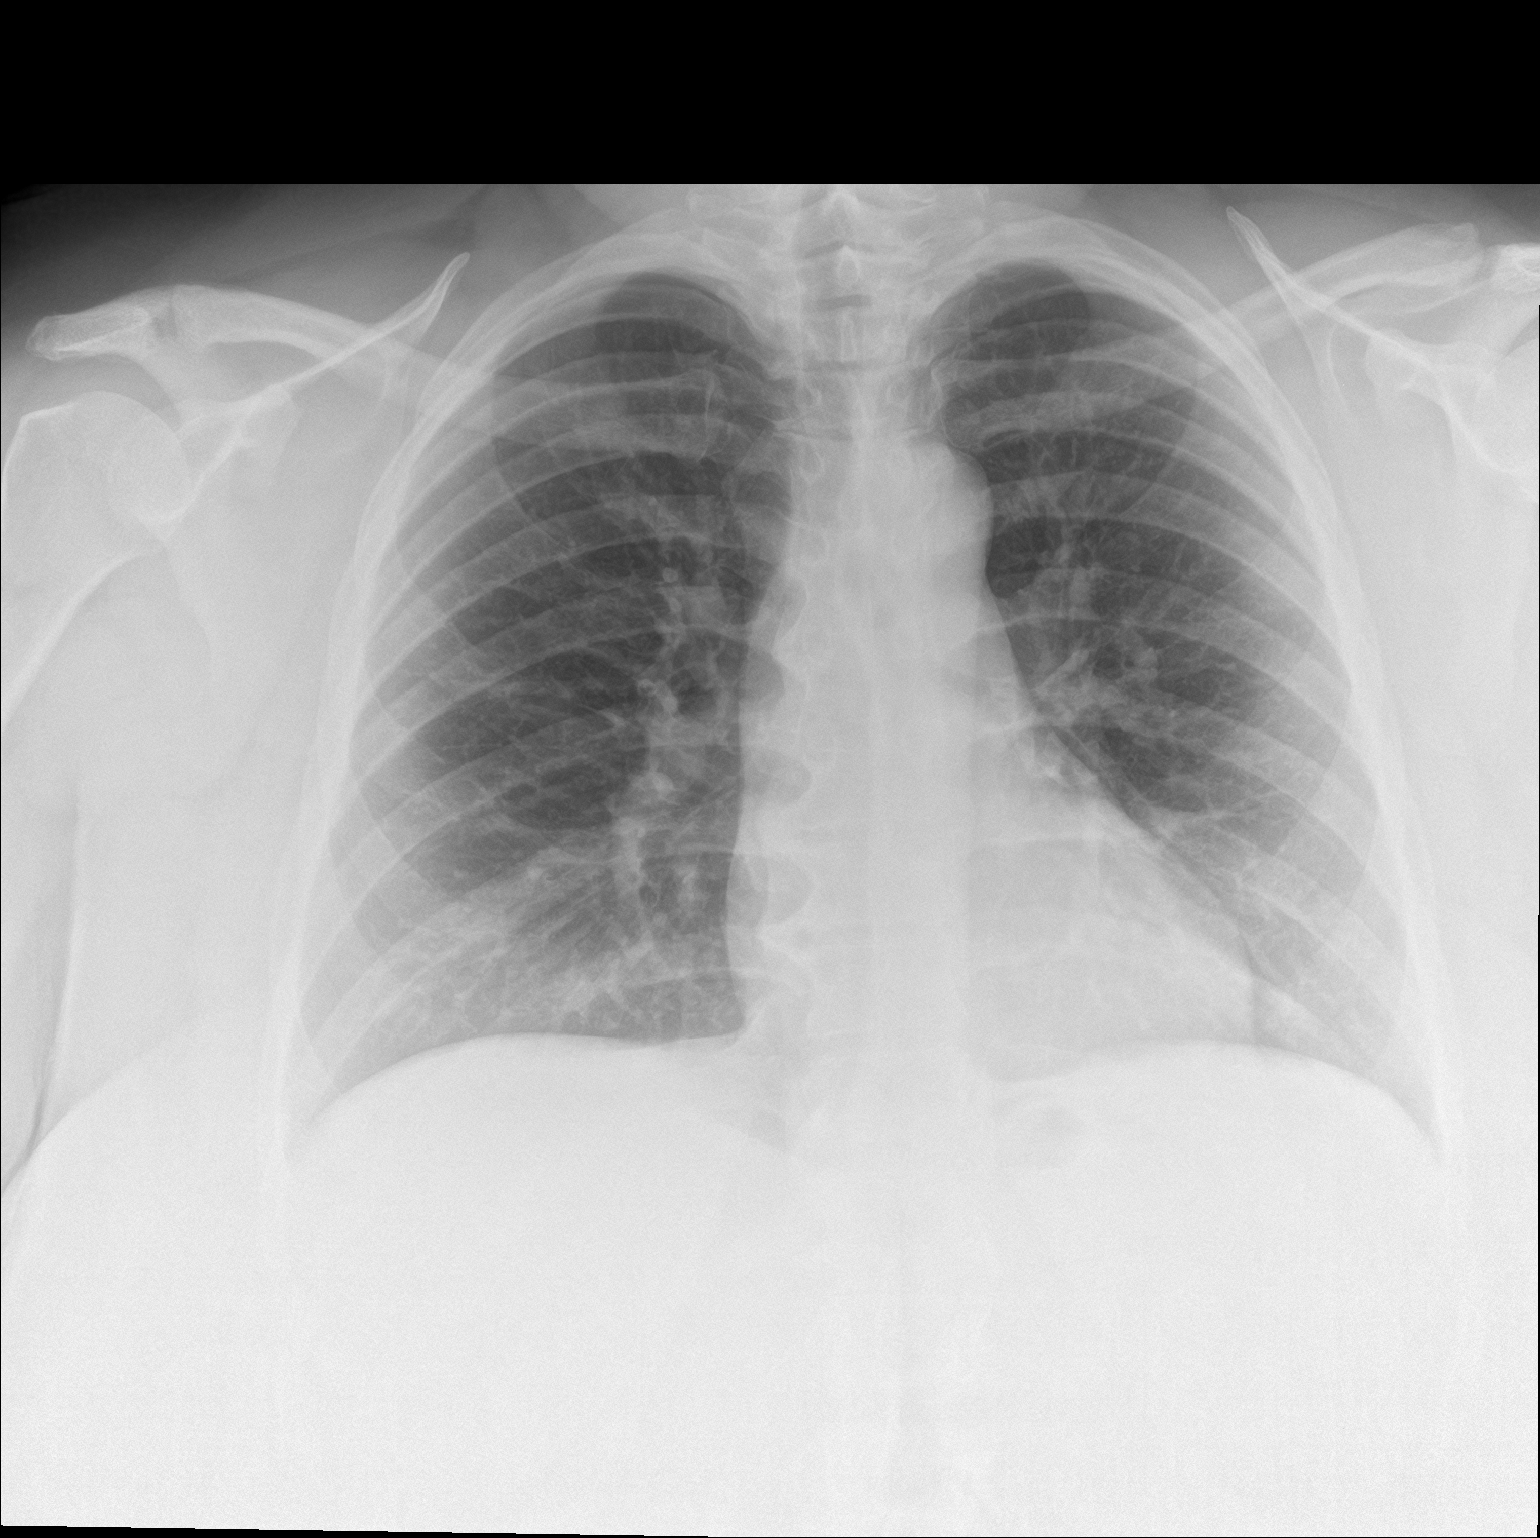

[chest lat]
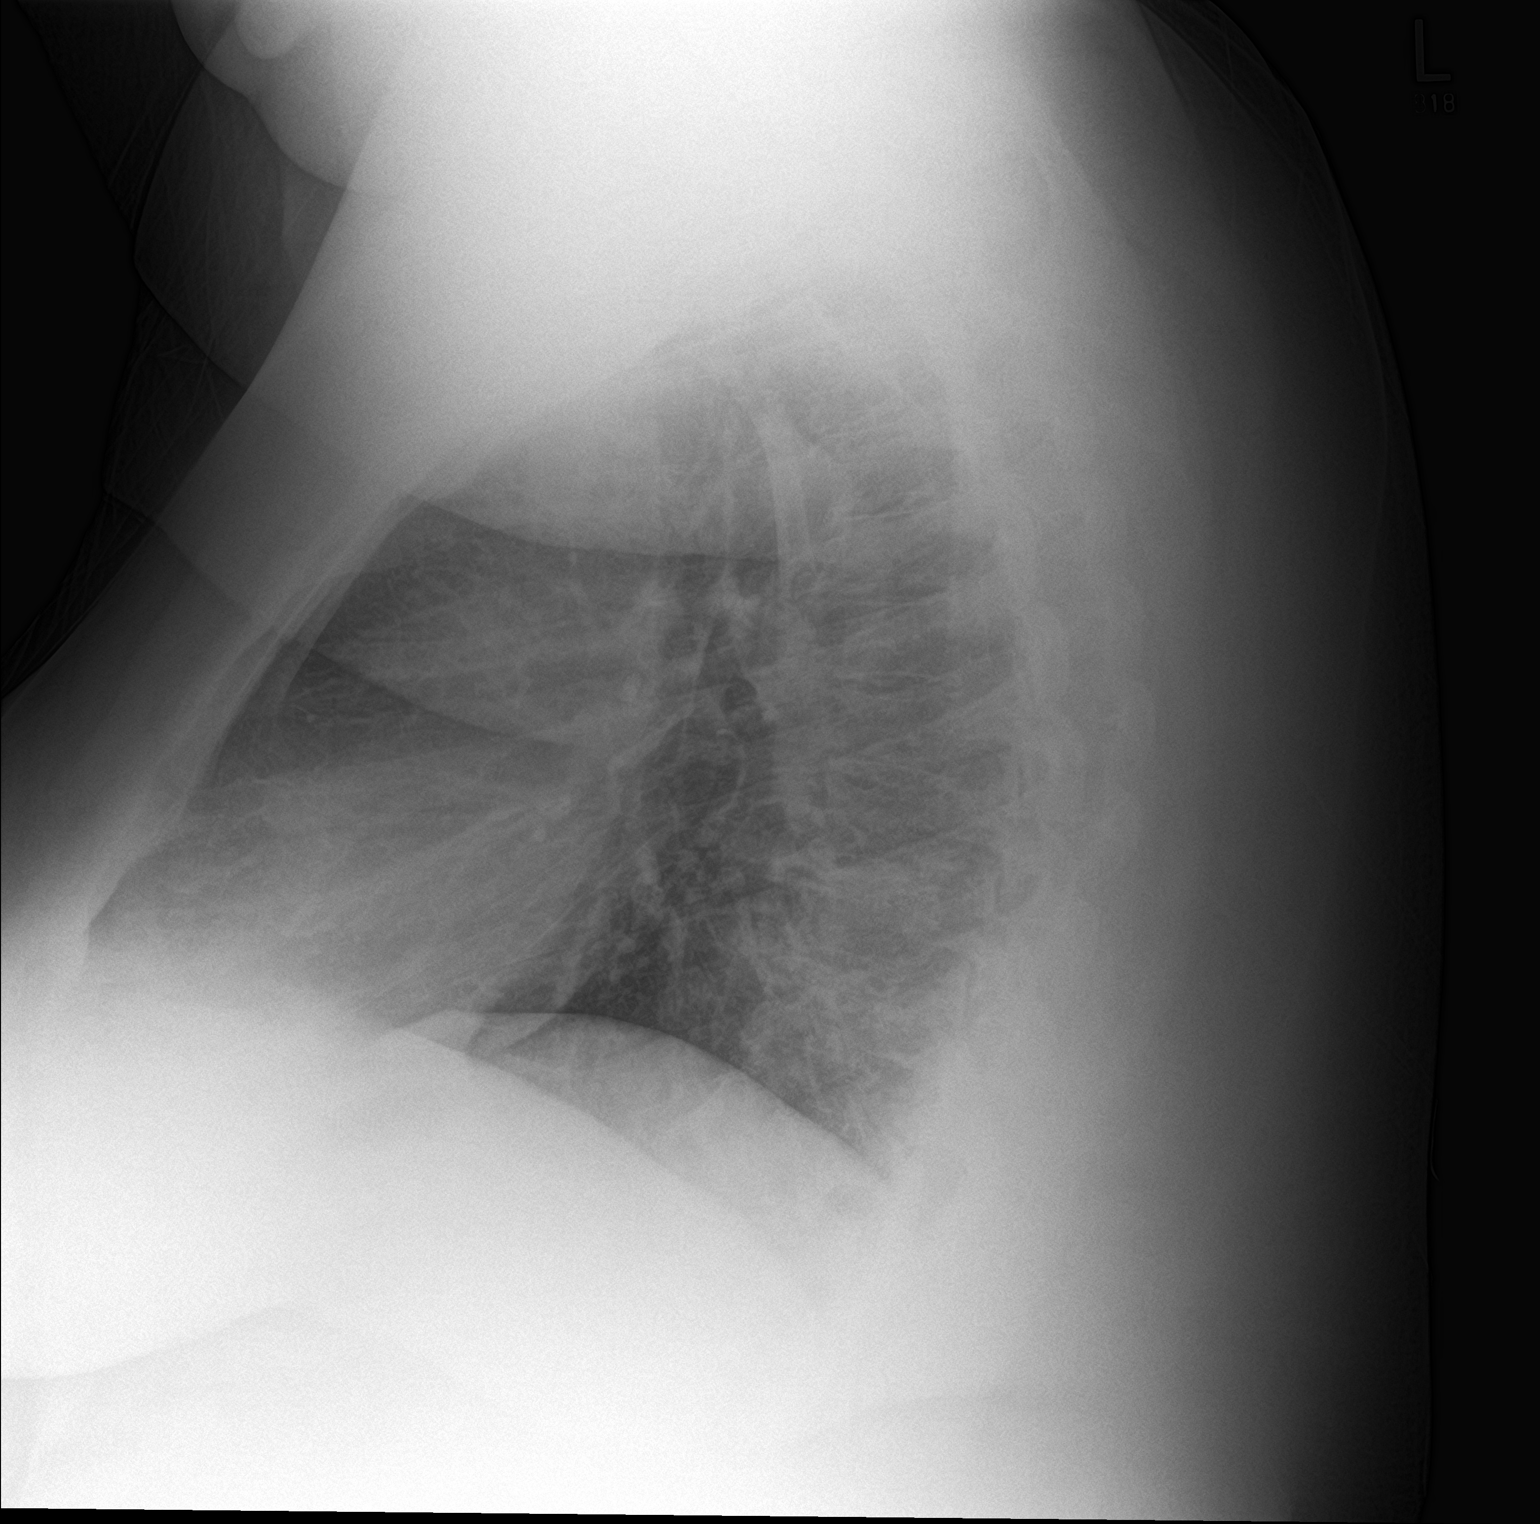

[2 of 2 positions shown; findings below may reference images not displayed]

FINDINGS: There is slight scarring in the left lower lobe. There is no edema
or consolidation. The heart size and pulmonary vascularity are
normal. No adenopathy. There is degenerative change in the thoracic
spine.
IMPRESSION: Slight scarring left lower lobe. No edema or consolidation. Heart
size normal.

## 2019-09-25 DIAGNOSIS — F6381 Intermittent explosive disorder: Secondary | ICD-10-CM | POA: Diagnosis not present

## 2019-10-23 DIAGNOSIS — F6381 Intermittent explosive disorder: Secondary | ICD-10-CM | POA: Diagnosis not present

## 2019-11-20 DIAGNOSIS — F6381 Intermittent explosive disorder: Secondary | ICD-10-CM | POA: Diagnosis not present

## 2019-11-28 ENCOUNTER — Telehealth: Payer: Self-pay | Admitting: *Deleted

## 2019-11-28 NOTE — Telephone Encounter (Signed)
Okay to use Volataren gel

## 2019-11-28 NOTE — Telephone Encounter (Signed)
Received call from patient.   Reports that she was advised to use Voltaren Gel for knee pain, but is concerned about possible side effects: Renal toxicity, stroke, HA, GI bleeding.   Advised that a smaller dose of medication will enter bloodstream as medication is a topical gel. Patient wanted PCP to advise if this will be a safe option for her. Of note, patient does state that she takes IBU 65m BID.  MD please advise.

## 2019-11-29 NOTE — Telephone Encounter (Signed)
Call placed to patient. [place].

## 2019-11-29 NOTE — Telephone Encounter (Signed)
Patient returned call and made aware.

## 2019-12-25 DIAGNOSIS — F6381 Intermittent explosive disorder: Secondary | ICD-10-CM | POA: Diagnosis not present

## 2020-02-26 DIAGNOSIS — F6381 Intermittent explosive disorder: Secondary | ICD-10-CM | POA: Diagnosis not present

## 2020-03-05 ENCOUNTER — Other Ambulatory Visit: Payer: Self-pay | Admitting: Family Medicine

## 2020-03-15 ENCOUNTER — Other Ambulatory Visit: Payer: Self-pay

## 2020-03-15 ENCOUNTER — Encounter: Payer: Self-pay | Admitting: Family Medicine

## 2020-03-15 ENCOUNTER — Telehealth (INDEPENDENT_AMBULATORY_CARE_PROVIDER_SITE_OTHER): Payer: Medicare Other | Admitting: Family Medicine

## 2020-03-15 DIAGNOSIS — J0100 Acute maxillary sinusitis, unspecified: Secondary | ICD-10-CM

## 2020-03-15 MED ORDER — ENALAPRIL MALEATE 20 MG PO TABS: 20.0000 mg | ORAL_TABLET | Freq: Every day | ORAL | 0 refills | Status: DC

## 2020-03-15 MED ORDER — AMOXICILLIN 875 MG PO TABS: 875.0000 mg | ORAL_TABLET | Freq: Two times a day (BID) | ORAL | 0 refills | Status: DC

## 2020-03-15 NOTE — Progress Notes (Signed)
Virtual Visit via Telephone Note  I connected with Jasmin Castro on 03/15/20 at 12:22pm  by telephone and verified that I am speaking with the correct person using two identifiers.    Pt location: at home   Physician location:  In office, [facility], Vic Blackbird MD     On call: patient and physician   I discussed the limitations, risks, security and privacy concerns of performing an evaluation and management service by telephone and the availability of in person appointments. I also discussed with the patient that there may be a patient responsible charge related to this service. The patient expressed understanding and agreed to proceed.   History of Present Illness: Pt called in with sinus pressure, sore throat, ears aching for the past couple of weeks She has been taking robitussin , allergy meds  She initially had cough and congestion but that has improved  Mother was sick with respiratory symptoms but no COVID-19  No diarrhea or vomiting Mild body aches   On review of chart, she is still using singulair  She states the prednisone made her itch that I gave her in [place] unable to come in due to transportation issues   Observations/Objective: Unable to visualize, normal speech, normal WOB over phone   No cough heard   Assessment and Plan: Treat for presumptive Acute sinusitis, has been using allergy meds  symptoms for 2 weeks, she has been staying within her home She is undecided on vaccination for COVID-19 Continue allergy meds, nasal spray, add amoxicillin   Needs in office OV within the next month and labs, she voiced understanding   Follow Up Instructions:    I discussed the assessment and treatment plan with the patient. The patient was provided an opportunity to ask questions and all were answered. The patient agreed with the plan and demonstrated an understanding of the instructions.   The patient was advised to call back or seek an  in-person evaluation if the symptoms worsen or if the condition fails to improve as anticipated.  I provided 8 minutes of non-face-to-face time during this encounter. End Time 12:30pm  Vic Blackbird, MD

## 2020-03-26 ENCOUNTER — Ambulatory Visit: Payer: Medicare Other | Admitting: Family Medicine

## 2020-03-29 ENCOUNTER — Ambulatory Visit (INDEPENDENT_AMBULATORY_CARE_PROVIDER_SITE_OTHER): Payer: Medicare Other | Admitting: Family Medicine

## 2020-03-29 ENCOUNTER — Other Ambulatory Visit: Payer: Self-pay

## 2020-03-29 ENCOUNTER — Encounter: Payer: Self-pay | Admitting: Family Medicine

## 2020-03-29 VITALS — BP 134/78 | HR 96 | Temp 98.0°F | Resp 18 | Ht 65.0 in | Wt >= 6400 oz

## 2020-03-29 DIAGNOSIS — I10 Essential (primary) hypertension: Secondary | ICD-10-CM | POA: Diagnosis not present

## 2020-03-29 DIAGNOSIS — R7309 Other abnormal glucose: Secondary | ICD-10-CM | POA: Diagnosis not present

## 2020-03-29 DIAGNOSIS — R609 Edema, unspecified: Secondary | ICD-10-CM

## 2020-03-29 DIAGNOSIS — E8881 Metabolic syndrome: Secondary | ICD-10-CM | POA: Diagnosis not present

## 2020-03-29 DIAGNOSIS — E782 Mixed hyperlipidemia: Secondary | ICD-10-CM | POA: Diagnosis not present

## 2020-03-29 DIAGNOSIS — J4520 Mild intermittent asthma, uncomplicated: Secondary | ICD-10-CM

## 2020-03-29 DIAGNOSIS — E559 Vitamin D deficiency, unspecified: Secondary | ICD-10-CM | POA: Diagnosis not present

## 2020-03-29 DIAGNOSIS — M25512 Pain in left shoulder: Secondary | ICD-10-CM | POA: Diagnosis not present

## 2020-03-29 NOTE — Progress Notes (Signed)
Subjective:    Patient ID: Jasmin Castro, female    DOB: 1976-08-20, 44 y.o.   MRN: [id number]  Patient presents for Follow-up (is fasting)    HTN-  Taking bp meds as prescribed.    MDD- taking zyprexa, pristiqui and ativan    Recent  Sinus infection has cleared, she is not requiring albuterol  OA knee- know OA with spurs, she states she cant walk due to the pain, takes ibuprofen daily   Hyperlipidemia- she has not been taking fenofibrate , tried it with q 10 but states she couldn't tolerate  Discussed COVID19 vaccine   Class 3 obesity- Typically skips breakfast  , around 2pm ate 10 piece chicken nugget, 2-3 sodas, 2 bottles, ICE cream ,  That's all you she ate  If she does eat at home she cooks, her mother and brother have difficulty moving around  Father passed 2019- he had cancer, dementia   She injured her left shoulder 4 weeks ago but doesn't recall what she did , has pain raising above shoulder height , no tingling of numbness in hands, no neck injury   Mammogram UTD  2020  Last in office visit was 09/2018, pt weight 411lbs    Review Of Systems:  GEN- denies fatigue, fever, weight loss,weakness, recent illness HEENT- denies eye drainage, change in vision, nasal discharge, CVS- denies chest pain, palpitations RESP- denies SOB, cough, wheeze ABD- denies N/V, change in stools, abd pain GU- denies dysuria, hematuria, dribbling, incontinence MSK- + joint pain, muscle aches, injury Neuro- denies headache, dizziness, syncope, seizure activity       Objective:    BP 134/78   Pulse 96   Temp 98 F (36.7 C) (Temporal)   Resp 18   Ht 5' 5"  (2.202 m)   Wt (!) 469 lb (212.7 kg)   LMP 02/23/2020 Comment: irregular  SpO2 94%   BMI 78.05 kg/m  GEN- NAD, alert and oriented x3,examined sitting in chair  HEENT- PERRL, EOMI, non injected sclera, pink conjunctiva, MMM, oropharynx clear, nares clear  Neck- Supple, no thyromegaly CVS- RRR, no  murmur RESP-CTAB ABD-NABS,soft,, NT, very large /heavy pannus, mild lymphedema changes in lower abdomen MSK- fair ROM bilat UE, pain with elevation left arm above shoulder height, neg empty can, biceps in tact  EXT- trace ankle edema Pulses- Radial, - 2+        Assessment & Plan:      Problem List Items Addressed This Visit      Unprioritized   Asthma    Respiratory symptoms back to her baseline. Continue Singulair was also help treat her allergies as well as her asthma.  Albuterol as needed      Hyperlipidemia    She has complained of SE with most cholesterol meds      Hypertension - Primary    Controlled no change to enalapril Check renal function.      Relevant Orders   CBC with Differential/Platelet   Comprehensive metabolic panel   TSH   Metabolic syndrome X   Relevant Orders   Hemoglobin A1c   Morbid obesity ([place])    Patient class III obesity with a BMI of 78.  She is unfortunately gained 58 pounds over the past year and a half.  She admits to typically eating 1 large meal a day which is typical of fast food with multiple sugary beverages.  She on occasion cooks but no one else in her family is able to do so because of  their general health.  We discussed that her decreased mobility and her current health problems will lead to her being bedridden as well.  She is willing to try small amounts of physical activity.  I think this will help with mobility.  I offered physical therapy even home health she declined.  She is going to start with trying to take a lap around her yard once a day.  We also discussed trying to eat at home twice a week.  SHe has metabolic syndrome we will check A1c lipids vitamin D today.      Relevant Orders   Hemoglobin A1c   Lipid panel   Peripheral edema    Other Visit Diagnoses    Acute pain of left shoulder       Diffuclt to examine with habitus,referral to orthopedics, may need steroid injection    Relevant Orders   Ambulatory  referral to Orthopedic Surgery   Vitamin D deficiency          Note: This dictation was prepared with Dragon dictation along with smaller phrase technology. Any transcriptional errors that result from this process are unintentional.

## 2020-03-29 NOTE — Assessment & Plan Note (Signed)
She has complained of SE with most cholesterol meds

## 2020-03-29 NOTE — Assessment & Plan Note (Signed)
Patient class III obesity with a BMI of 78.  She is unfortunately gained 58 pounds over the past year and a half.  She admits to typically eating 1 large meal a day which is typical of fast food with multiple sugary beverages.  She on occasion cooks but no one else in her family is able to do so because of their general health.  We discussed that her decreased mobility and her current health problems will lead to her being bedridden as well.  She is willing to try small amounts of physical activity.  I think this will help with mobility.  I offered physical therapy even home health she declined.  She is going to start with trying to take a lap around her yard once a day.  We also discussed trying to eat at home twice a week.  SHe has metabolic syndrome we will check A1c lipids vitamin D today.

## 2020-03-29 NOTE — Assessment & Plan Note (Signed)
Controlled no change to enalapril Check renal function.

## 2020-03-29 NOTE — Assessment & Plan Note (Addendum)
Respiratory symptoms back to her baseline. Continue Singulair was also help treat her allergies as well as her asthma.  Albuterol as needed

## 2020-03-29 NOTE — Patient Instructions (Addendum)
F/U 4 months for Physical  We will call with lab results  [facility]  Try to cook at home twice a week Walk one lap a day

## 2020-03-30 LAB — COMPREHENSIVE METABOLIC PANEL
AG Ratio: 1.2 (calc) (ref 1.0–2.5)
ALT: 51 U/L — ABNORMAL HIGH (ref 6–29)
AST: 60 U/L — ABNORMAL HIGH (ref 10–35)
Albumin: 3.8 g/dL (ref 3.6–5.1)
Alkaline phosphatase (APISO): 139 U/L — ABNORMAL HIGH (ref 31–125)
BUN: 9 mg/dL (ref 7–25)
CO2: 26 mmol/L (ref 20–32)
Calcium: 9.6 mg/dL (ref 8.6–10.2)
Chloride: 98 mmol/L (ref 98–110)
Creat: 0.79 mg/dL (ref 0.50–1.10)
Globulin: 3.3 g/dL (calc) (ref 1.9–3.7)
Glucose, Bld: 226 mg/dL — ABNORMAL HIGH (ref 65–99)
Potassium: 4.6 mmol/L (ref 3.5–5.3)
Sodium: 138 mmol/L (ref 135–146)
Total Bilirubin: 0.7 mg/dL (ref 0.2–1.2)
Total Protein: 7.1 g/dL (ref 6.1–8.1)

## 2020-03-30 LAB — CBC WITH DIFFERENTIAL/PLATELET
Absolute Monocytes: 847 cells/uL (ref 200–950)
Basophils Absolute: 48 cells/uL (ref 0–200)
Basophils Relative: 0.4 %
Eosinophils Absolute: 97 cells/uL (ref 15–500)
Eosinophils Relative: 0.8 %
HCT: 39.6 % (ref 35.0–45.0)
Hemoglobin: 11.8 g/dL (ref 11.7–15.5)
Lymphs Abs: 3170 cells/uL (ref 850–3900)
MCH: 24.8 pg — ABNORMAL LOW (ref 27.0–33.0)
MCHC: 29.8 g/dL — ABNORMAL LOW (ref 32.0–36.0)
MCV: 83.4 fL (ref 80.0–100.0)
MPV: 10.2 fL (ref 7.5–12.5)
Monocytes Relative: 7 %
Neutro Abs: 7938 cells/uL — ABNORMAL HIGH (ref 1500–7800)
Neutrophils Relative %: 65.6 %
Platelets: 400 10*3/uL (ref 140–400)
RBC: 4.75 10*6/uL (ref 3.80–5.10)
RDW: 14.9 % (ref 11.0–15.0)
Total Lymphocyte: 26.2 %
WBC: 12.1 10*3/uL — ABNORMAL HIGH (ref 3.8–10.8)

## 2020-03-30 LAB — TSH: TSH: 3.69 mIU/L

## 2020-03-30 LAB — LIPID PANEL
Cholesterol: 256 mg/dL — ABNORMAL HIGH (ref <200)
HDL: 46 mg/dL — ABNORMAL LOW (ref >=50)
LDL Cholesterol (Calc): 183 mg/dL (calc) — ABNORMAL HIGH
Non-HDL Cholesterol (Calc): 210 mg/dL (calc) — ABNORMAL HIGH (ref <130)
Total CHOL/HDL Ratio: 5.6 (calc) — ABNORMAL HIGH (ref <5.0)
Triglycerides: 134 mg/dL (ref <150)

## 2020-03-30 LAB — HEMOGLOBIN A1C
Hgb A1c MFr Bld: 7.4 % of total Hgb — ABNORMAL HIGH (ref <5.7)
Mean Plasma Glucose: 166 (calc)
eAG (mmol/L): 9.2 (calc)

## 2020-04-01 ENCOUNTER — Other Ambulatory Visit: Payer: Self-pay | Admitting: *Deleted

## 2020-04-01 DIAGNOSIS — R7989 Other specified abnormal findings of blood chemistry: Secondary | ICD-10-CM

## 2020-04-01 DIAGNOSIS — E8881 Metabolic syndrome: Secondary | ICD-10-CM

## 2020-04-01 DIAGNOSIS — K760 Fatty (change of) liver, not elsewhere classified: Secondary | ICD-10-CM

## 2020-04-01 MED ORDER — TRULICITY 1.5 MG/0.5ML ~~LOC~~ SOAJ
1.5000 mg | SUBCUTANEOUS | 11 refills | Status: DC
Start: 2020-04-01 — End: 2020-10-25

## 2020-04-01 MED ORDER — TRULICITY 0.75 MG/0.5ML ~~LOC~~ SOAJ: 0.7500 mg | SUBCUTANEOUS | 0 refills | Status: DC

## 2020-04-03 ENCOUNTER — Other Ambulatory Visit: Payer: Medicare Other

## 2020-04-08 ENCOUNTER — Other Ambulatory Visit: Payer: Self-pay | Admitting: *Deleted

## 2020-04-12 ENCOUNTER — Ambulatory Visit (HOSPITAL_COMMUNITY): Payer: Medicare Other | Attending: Family Medicine

## 2020-04-24 ENCOUNTER — Ambulatory Visit: Payer: Medicare Other | Admitting: Orthopaedic Surgery

## 2020-04-26 ENCOUNTER — Telehealth: Payer: Self-pay | Admitting: Family Medicine

## 2020-04-26 NOTE — Telephone Encounter (Signed)
Patients brother Vonna Kotyk CB# [phone] is calling out of concern for patient. He states that patient isn't watching what she is eating or checking her blood sugars. He wanted to know if maybe we could reach out to patient and discuss

## 2020-05-01 ENCOUNTER — Other Ambulatory Visit: Payer: Self-pay | Admitting: Family Medicine

## 2020-05-01 NOTE — Telephone Encounter (Signed)
Noted and PCP made aware.

## 2020-05-07 ENCOUNTER — Ambulatory Visit: Payer: Medicare Other | Admitting: Family Medicine

## 2020-05-22 ENCOUNTER — Ambulatory Visit: Payer: Medicare Other | Admitting: Family Medicine

## 2020-05-27 DIAGNOSIS — F3130 Bipolar disorder, current episode depressed, mild or moderate severity, unspecified: Secondary | ICD-10-CM | POA: Diagnosis not present

## 2020-06-03 ENCOUNTER — Ambulatory Visit: Payer: Medicare Other | Admitting: Family Medicine

## 2020-06-29 ENCOUNTER — Other Ambulatory Visit: Payer: Self-pay | Admitting: Family Medicine

## 2020-06-29 DIAGNOSIS — B372 Candidiasis of skin and nail: Secondary | ICD-10-CM

## 2020-07-03 ENCOUNTER — Ambulatory Visit: Payer: Medicare Other | Admitting: Orthopaedic Surgery

## 2020-07-15 ENCOUNTER — Ambulatory Visit: Payer: Self-pay | Admitting: Family Medicine

## 2020-07-16 ENCOUNTER — Telehealth: Payer: Self-pay | Admitting: *Deleted

## 2020-07-16 NOTE — Telephone Encounter (Signed)
Message sent thru [facility]

## 2020-07-16 NOTE — Telephone Encounter (Signed)
Received call from patient.   Reports that she has discharge from her naval x2 weeks. Reports that drainage is clear to light pink with slight odor.   Requested order for Nystatin.   Advised OV will be required. Unable to come to office for visit, but did agree to telehealth.

## 2020-07-16 NOTE — Telephone Encounter (Signed)
Virtual visit must be done, I will not do telephone visit for this She needs to be seen in person, overdue for diabetes labs

## 2020-07-17 ENCOUNTER — Encounter: Payer: Self-pay | Admitting: Family Medicine

## 2020-07-17 ENCOUNTER — Telehealth (INDEPENDENT_AMBULATORY_CARE_PROVIDER_SITE_OTHER): Payer: Medicare Other | Admitting: Family Medicine

## 2020-07-17 DIAGNOSIS — E8881 Metabolic syndrome: Secondary | ICD-10-CM

## 2020-07-17 DIAGNOSIS — R7989 Other specified abnormal findings of blood chemistry: Secondary | ICD-10-CM | POA: Diagnosis not present

## 2020-07-17 DIAGNOSIS — E1169 Type 2 diabetes mellitus with other specified complication: Secondary | ICD-10-CM | POA: Diagnosis not present

## 2020-07-17 DIAGNOSIS — B353 Tinea pedis: Secondary | ICD-10-CM

## 2020-07-17 DIAGNOSIS — L0882 Omphalitis not of newborn: Secondary | ICD-10-CM

## 2020-07-17 MED ORDER — CLOTRIMAZOLE 1 % EX CREA: 1.0000 "application " | TOPICAL_CREAM | Freq: Two times a day (BID) | CUTANEOUS | 0 refills | Status: DC

## 2020-07-17 MED ORDER — SULFAMETHOXAZOLE-TRIMETHOPRIM 800-160 MG PO TABS: 1.0000 | ORAL_TABLET | Freq: Two times a day (BID) | ORAL | 0 refills | Status: DC

## 2020-07-17 MED ORDER — MUPIROCIN 2 % EX OINT: TOPICAL_OINTMENT | Freq: Two times a day (BID) | CUTANEOUS | 0 refills | Status: DC

## 2020-07-17 NOTE — Progress Notes (Signed)
Virtual Visit via Video Note  I connected with Jasmin Castro on 07/17/20 at 7:56am  by a video enabled telemedicine application and verified that I am speaking with the correct person using two identifiers.  Location: Patient: At home  Provider: In office    I discussed the limitations of evaluation and management by telemedicine and the availability of in person appointments. The patient expressed understanding and agreed to proceed.  History of Present Illness: 2 weeks ago, patient started having crusting from her naval, then progressed to drainage from the navel and she has redness surrounding it.  She denies any pain.  She states there is an odor to the discharge.  She has not had any fever chills no nausea vomiting no change in bowel movements.  He also states that she has peeling skin on her left great toe that has been there for a few days   DM- Last A1C in July 7.4%, has not been rechecked, she checks CBG randomly  I had her check while on the visit, CBG fasting today 162, last checked Oct 17th per pt and was 163   Obesity-  she then asked what she can eat.  States that she won a diet plan.  She has seen dietitian in the past but not feel like it was very helpful.  She does eat a lot of fast food and large quantities.  Observations/Objective: NAD, Alert and oriented x 3  RESP- normal WOB Skin- erythema around hyperpigmented naval region, unable to see any discharge on camera Left great toe with peeling skin on top and between toes   Assessment and Plan: Omphalitis - treat with bactrim DS and topical bactroban, unable to fully assess due to grainy video and pt is diabetic , she is not having any systemic symptoms  DM type 2-she will get fasting labs done this Friday, continue Trulcity, advised to check CBG fasting daily   Tinea pedis- clotrimazole ointment BID  Class 3 obesity with Hyperlipidemia- repeat fasting labs, orders to be sent to local lab as pt unable to come in,  due to care for mother She declined referral back to dietician in her area  dicussed general guidelines, reducing sugary drinks, sweets/ simple carbs , eating more veggies, fruits and lean proteins   Follow Up Instructions:    I discussed the assessment and treatment plan with the patient. The patient was provided an opportunity to ask questions and all were answered. The patient agreed with the plan and demonstrated an understanding of the instructions.   The patient was advised to call back or seek an in-person evaluation if the symptoms worsen or if the condition fails to improve as anticipated.  I provided 14 minutes of non-face-to-face time during this encounter. End Time 8:10am   Vic Blackbird, MD

## 2020-07-23 ENCOUNTER — Telehealth: Payer: Self-pay | Admitting: Family Medicine

## 2020-07-23 NOTE — Telephone Encounter (Signed)
Call placed to patient to inquire. Jasmin Castro.

## 2020-07-23 NOTE — Telephone Encounter (Signed)
Pt went to [facility] have labs drawn Medicare will not pay for the labs that Dr.[place] order. Please advise pt

## 2020-07-23 NOTE — Telephone Encounter (Signed)
Please Advise

## 2020-07-23 NOTE — Telephone Encounter (Signed)
Acute Hepatitis Panel is not covered with Dx Elevated LFT.   Please advise.

## 2020-07-25 ENCOUNTER — Other Ambulatory Visit: Payer: Self-pay | Admitting: *Deleted

## 2020-07-25 DIAGNOSIS — E8881 Metabolic syndrome: Secondary | ICD-10-CM

## 2020-07-25 DIAGNOSIS — E1169 Type 2 diabetes mellitus with other specified complication: Secondary | ICD-10-CM

## 2020-07-25 DIAGNOSIS — E782 Mixed hyperlipidemia: Secondary | ICD-10-CM

## 2020-07-25 DIAGNOSIS — Z1159 Encounter for screening for other viral diseases: Secondary | ICD-10-CM

## 2020-07-25 DIAGNOSIS — I10 Essential (primary) hypertension: Secondary | ICD-10-CM

## 2020-07-25 NOTE — Telephone Encounter (Signed)
Okay , she can have Hep C antibody, Dx hepatitis c screening

## 2020-07-25 NOTE — Telephone Encounter (Signed)
New orders faxed to [facility].   Call placed to patient and patient made aware.

## 2020-08-14 ENCOUNTER — Encounter: Payer: Medicare Other | Admitting: Family Medicine

## 2020-08-26 DIAGNOSIS — F3130 Bipolar disorder, current episode depressed, mild or moderate severity, unspecified: Secondary | ICD-10-CM | POA: Diagnosis not present

## 2020-08-28 DIAGNOSIS — R7989 Other specified abnormal findings of blood chemistry: Secondary | ICD-10-CM | POA: Diagnosis not present

## 2020-08-28 DIAGNOSIS — E8881 Metabolic syndrome: Secondary | ICD-10-CM | POA: Diagnosis not present

## 2020-08-28 DIAGNOSIS — J029 Acute pharyngitis, unspecified: Secondary | ICD-10-CM | POA: Diagnosis not present

## 2020-08-28 DIAGNOSIS — M2550 Pain in unspecified joint: Secondary | ICD-10-CM | POA: Diagnosis not present

## 2020-08-28 DIAGNOSIS — E1169 Type 2 diabetes mellitus with other specified complication: Secondary | ICD-10-CM | POA: Diagnosis not present

## 2020-08-28 DIAGNOSIS — E782 Mixed hyperlipidemia: Secondary | ICD-10-CM | POA: Diagnosis not present

## 2020-08-28 DIAGNOSIS — I10 Essential (primary) hypertension: Secondary | ICD-10-CM | POA: Diagnosis not present

## 2020-08-28 DIAGNOSIS — R7401 Elevation of levels of liver transaminase levels: Secondary | ICD-10-CM | POA: Diagnosis not present

## 2020-08-28 DIAGNOSIS — Z1159 Encounter for screening for other viral diseases: Secondary | ICD-10-CM | POA: Diagnosis not present

## 2020-08-29 LAB — CBC WITH DIFFERENTIAL/PLATELET
Absolute Monocytes: 914 cells/uL (ref 200–950)
Basophils Absolute: 64 cells/uL (ref 0–200)
Basophils Relative: 0.5 %
Eosinophils Absolute: 165 cells/uL (ref 15–500)
Eosinophils Relative: 1.3 %
HCT: 39.5 % (ref 35.0–45.0)
Hemoglobin: 12.5 g/dL (ref 11.7–15.5)
Lymphs Abs: 3721 cells/uL (ref 850–3900)
MCH: 25.9 pg — ABNORMAL LOW (ref 27.0–33.0)
MCHC: 31.6 g/dL — ABNORMAL LOW (ref 32.0–36.0)
MCV: 82 fL (ref 80.0–100.0)
MPV: 10.3 fL (ref 7.5–12.5)
Monocytes Relative: 7.2 %
Neutro Abs: 7836 cells/uL — ABNORMAL HIGH (ref 1500–7800)
Neutrophils Relative %: 61.7 %
Platelets: 511 10*3/uL — ABNORMAL HIGH (ref 140–400)
RBC: 4.82 10*6/uL (ref 3.80–5.10)
RDW: 14.5 % (ref 11.0–15.0)
Total Lymphocyte: 29.3 %
WBC: 12.7 10*3/uL — ABNORMAL HIGH (ref 3.8–10.8)

## 2020-08-29 LAB — LIPID PANEL
Cholesterol: 260 mg/dL — ABNORMAL HIGH (ref <200)
HDL: 51 mg/dL (ref >=50)
LDL Cholesterol (Calc): 184 mg/dL (calc) — ABNORMAL HIGH
Non-HDL Cholesterol (Calc): 209 mg/dL (calc) — ABNORMAL HIGH (ref <130)
Total CHOL/HDL Ratio: 5.1 (calc) — ABNORMAL HIGH (ref <5.0)
Triglycerides: 121 mg/dL (ref <150)

## 2020-08-29 LAB — COMPLETE METABOLIC PANEL WITH GFR
AG Ratio: 1.2 (calc) (ref 1.0–2.5)
ALT: 35 U/L — ABNORMAL HIGH (ref 6–29)
AST: 34 U/L (ref 10–35)
Albumin: 3.9 g/dL (ref 3.6–5.1)
Alkaline phosphatase (APISO): 129 U/L — ABNORMAL HIGH (ref 31–125)
BUN: 8 mg/dL (ref 7–25)
CO2: 27 mmol/L (ref 20–32)
Calcium: 9.5 mg/dL (ref 8.6–10.2)
Chloride: 100 mmol/L (ref 98–110)
Creat: 0.59 mg/dL (ref 0.50–1.10)
GFR, Est African American: 127 mL/min/{1.73_m2} (ref >=60)
GFR, Est Non African American: 110 mL/min/{1.73_m2} (ref >=60)
Globulin: 3.2 g/dL (calc) (ref 1.9–3.7)
Glucose, Bld: 142 mg/dL — ABNORMAL HIGH (ref 65–99)
Potassium: 4.2 mmol/L (ref 3.5–5.3)
Sodium: 138 mmol/L (ref 135–146)
Total Bilirubin: 0.6 mg/dL (ref 0.2–1.2)
Total Protein: 7.1 g/dL (ref 6.1–8.1)

## 2020-08-29 LAB — HEPATITIS PANEL, ACUTE
Hep A IgM: NONREACTIVE
Hep B C IgM: NONREACTIVE
Hepatitis B Surface Ag: NONREACTIVE
Hepatitis C Ab: NONREACTIVE
SIGNAL TO CUT-OFF: 0.18 (ref <1.00)

## 2020-08-29 LAB — HEMOGLOBIN A1C
Hgb A1c MFr Bld: 6.2 % of total Hgb — ABNORMAL HIGH (ref <5.7)
Mean Plasma Glucose: 131 mg/dL
eAG (mmol/L): 7.3 mmol/L

## 2020-08-30 ENCOUNTER — Other Ambulatory Visit: Payer: Self-pay | Admitting: *Deleted

## 2020-08-30 MED ORDER — NYSTATIN 100000 UNIT/GM EX CREA: 1.0000 "application " | TOPICAL_CREAM | Freq: Two times a day (BID) | CUTANEOUS | 0 refills | Status: DC

## 2020-08-30 MED ORDER — ROSUVASTATIN CALCIUM 10 MG PO TABS: 10.0000 mg | ORAL_TABLET | ORAL | 3 refills | Status: DC

## 2020-10-10 ENCOUNTER — Other Ambulatory Visit: Payer: Self-pay | Admitting: Family Medicine

## 2020-10-25 ENCOUNTER — Ambulatory Visit (INDEPENDENT_AMBULATORY_CARE_PROVIDER_SITE_OTHER): Payer: Medicare Other | Admitting: Family Medicine

## 2020-10-25 ENCOUNTER — Encounter: Payer: Self-pay | Admitting: Family Medicine

## 2020-10-25 ENCOUNTER — Other Ambulatory Visit: Payer: Self-pay

## 2020-10-25 VITALS — BP 130/78 | HR 110 | Temp 98.7°F | Resp 14 | Ht 65.0 in | Wt >= 6400 oz

## 2020-10-25 DIAGNOSIS — E1169 Type 2 diabetes mellitus with other specified complication: Secondary | ICD-10-CM

## 2020-10-25 DIAGNOSIS — B372 Candidiasis of skin and nail: Secondary | ICD-10-CM | POA: Diagnosis not present

## 2020-10-25 DIAGNOSIS — E8881 Metabolic syndrome: Secondary | ICD-10-CM

## 2020-10-25 DIAGNOSIS — N926 Irregular menstruation, unspecified: Secondary | ICD-10-CM

## 2020-10-25 DIAGNOSIS — M170 Bilateral primary osteoarthritis of knee: Secondary | ICD-10-CM

## 2020-10-25 DIAGNOSIS — I10 Essential (primary) hypertension: Secondary | ICD-10-CM

## 2020-10-25 DIAGNOSIS — Z1231 Encounter for screening mammogram for malignant neoplasm of breast: Secondary | ICD-10-CM | POA: Diagnosis not present

## 2020-10-25 DIAGNOSIS — D72829 Elevated white blood cell count, unspecified: Secondary | ICD-10-CM

## 2020-10-25 DIAGNOSIS — L0882 Omphalitis not of newborn: Secondary | ICD-10-CM

## 2020-10-25 MED ORDER — NYSTATIN 100000 UNIT/GM EX CREA: 1.0000 "application " | TOPICAL_CREAM | Freq: Two times a day (BID) | CUTANEOUS | 2 refills | Status: DC

## 2020-10-25 MED ORDER — MUPIROCIN 2 % EX OINT: TOPICAL_OINTMENT | Freq: Two times a day (BID) | CUTANEOUS | 0 refills | Status: DC

## 2020-10-25 MED ORDER — ENALAPRIL MALEATE 20 MG PO TABS: 20.0000 mg | ORAL_TABLET | Freq: Every day | ORAL | 5 refills | Status: DC

## 2020-10-25 MED ORDER — FLUCONAZOLE 150 MG PO TABS: ORAL_TABLET | ORAL | 0 refills | Status: DC

## 2020-10-25 MED ORDER — DOXYCYCLINE HYCLATE 100 MG PO TABS: 100.0000 mg | ORAL_TABLET | Freq: Two times a day (BID) | ORAL | 0 refills | Status: DC

## 2020-10-25 MED ORDER — TRULICITY 3 MG/0.5ML ~~LOC~~ SOAJ: 3.0000 mg | SUBCUTANEOUS | 3 refills | Status: DC

## 2020-10-25 NOTE — Assessment & Plan Note (Signed)
Controlled no changes Long discussion regarding diet, and need to improve weight nutrition She is not taking crestor reviewed labs with her and risk for heart disease and stroke

## 2020-10-25 NOTE — Assessment & Plan Note (Signed)
OA in setting of morbid obesity Worsening gait  Not a surgical candidate due to weight Needs walker and bench to assist with ADL's and hygiene

## 2020-10-25 NOTE — Patient Instructions (Addendum)
Reerral to GYN  Schedule mammogram at [facility] in [facility] and [place] sent to apothecary  F/U 1 week for recheck

## 2020-10-25 NOTE — Progress Notes (Addendum)
Subjective:    Patient ID: Jasmin Castro, female    DOB: August 10, 1976, 45 y.o.   MRN: [id number]  Patient presents for Umbilical Infection (Completed ABTx, has been using bactroban- odor and brownish/ blood tinged drainage/)  Pt here to f/u labs and medications   In November she had telehealth visit , she was trated for  Probable omphalitis but difficult to see over phone she had stated she had crusting and redness arpound the naval She took bactrim, but did use bactroban as directed It improved and now worsened the past week, it started bleeding with an odor with brown white discharge No fever or chills    She was treated for tinea pedis   Morbid obesity-  Weight down 10lbs in the past month   She has noticed a change in her menstrual cycle past few months, has cramps but minimal bleeding  She needs bariatric walker and Arts development officer    Review Of Systems:  GEN- denies fatigue, fever, weight loss,weakness, recent illness HEENT- denies eye drainage, change in vision, nasal discharge, CVS- denies chest pain, palpitations RESP- denies SOB, cough, wheeze ABD- denies N/V, change in stools, abd pain GU- denies dysuria, hematuria, dribbling, incontinence MSK- + joint pain, muscle aches, injury Neuro- denies headache, dizziness, syncope, seizure activity       Objective:    BP 130/78   Pulse (!) 110   Temp 98.7 F (37.1 C) (Temporal)   Resp 14   Ht 5' 5"  (1.651 m)   Wt (!) 458 lb (207.7 kg)   SpO2 95%   BMI 76.22 kg/m  GEN- NAD, alert and oriented x3, sitting in chair  HEENT- PERRL, EOMI, non injected sclera, pink conjunctiva,  Neck- Supple, no thyromegaly CVS- RR , tachycardia , no murmur RESP-CTAB ABD-NABS,soft,NT,ND, swelling of umbilicus with 4 small circular ulcerations, mild bleeding, brown discharge leaking out with odor EXT- mild ankle edema Pulses- Radial 2+        Assessment & Plan:      Problem List Items Addressed This Visit      Unprioritized    Hypertension    Controlled no changes Long discussion regarding diet, and need to improve weight nutrition She is not taking crestor reviewed labs with her and risk for heart disease and stroke       Relevant Medications   enalapril (VASOTEC) 20 MG tablet   Other Relevant Orders   CBC with Differential/Platelet   Comprehensive metabolic panel   Metabolic syndrome X   Morbid obesity (HCC)   Relevant Medications   Dulaglutide (TRULICITY) 3 JS/9.7WY SOPN   OA (osteoarthritis) of knee    OA in setting of morbid obesity Worsening gait  Not a surgical candidate due to weight Needs walker and bench to assist with ADL's and hygiene      Type 2 diabetes mellitus with other specified complication ([place])    In setting of morbid obesity Increase trulicity to 69m once week to maximize GLP-1 therapy It does have reduce her cravings       Relevant Medications   enalapril (VASOTEC) 20 MG tablet   Dulaglutide (TRULICITY) 3 MOV/7.8HYSOPN   Other Relevant Orders   CBC with Differential/Platelet   Comprehensive metabolic panel    Other Visit Diagnoses    Leukocytosis, unspecified type    -  Primary   recheck labs   Omphalitis in adult       with superficial ulceration in 4 small areas on belly button, given bactroban,  doxycycline Discussed possible complications if no improvement Recheck in 1 week in office    Relevant Medications   fluconazole (DIFLUCAN) 150 MG tablet   mupirocin ointment (BACTROBAN) 2 %   nystatin cream (MYCOSTATIN)   Other Relevant Orders   WOUND CULTURE   Candidal intertrigo       given nystatin powder, and lotrisone if needed   Relevant Medications   fluconazole (DIFLUCAN) 150 MG tablet   mupirocin ointment (BACTROBAN) 2 %   nystatin cream (MYCOSTATIN)   Encounter for screening mammogram for malignant neoplasm of breast       Relevant Orders   MM 3D SCREEN BREAST BILATERAL   Irregular menses       re-establish with GYN, needs pelvic exam, pt also to schedule  mammogram      Note: This dictation was prepared with Dragon dictation along with smaller phrase technology. Any transcriptional errors that result from this process are unintentional.

## 2020-10-25 NOTE — Assessment & Plan Note (Signed)
In setting of morbid obesity Increase trulicity to 77m once week to maximize GLP-1 therapy It does have reduce her cravings

## 2020-10-26 LAB — CBC WITH DIFFERENTIAL/PLATELET
Absolute Monocytes: 882 cells/uL (ref 200–950)
Basophils Absolute: 50 cells/uL (ref 0–200)
Basophils Relative: 0.4 %
Eosinophils Absolute: 101 cells/uL (ref 15–500)
Eosinophils Relative: 0.8 %
HCT: 39.1 % (ref 35.0–45.0)
Hemoglobin: 12.1 g/dL (ref 11.7–15.5)
Lymphs Abs: 2759 cells/uL (ref 850–3900)
MCH: 25.5 pg — ABNORMAL LOW (ref 27.0–33.0)
MCHC: 30.9 g/dL — ABNORMAL LOW (ref 32.0–36.0)
MCV: 82.5 fL (ref 80.0–100.0)
MPV: 10 fL (ref 7.5–12.5)
Monocytes Relative: 7 %
Neutro Abs: 8807 cells/uL — ABNORMAL HIGH (ref 1500–7800)
Neutrophils Relative %: 69.9 %
Platelets: 441 10*3/uL — ABNORMAL HIGH (ref 140–400)
RBC: 4.74 10*6/uL (ref 3.80–5.10)
RDW: 15.1 % — ABNORMAL HIGH (ref 11.0–15.0)
Total Lymphocyte: 21.9 %
WBC: 12.6 10*3/uL — ABNORMAL HIGH (ref 3.8–10.8)

## 2020-10-26 LAB — COMPREHENSIVE METABOLIC PANEL
AG Ratio: 1.2 (calc) (ref 1.0–2.5)
ALT: 32 U/L — ABNORMAL HIGH (ref 6–29)
AST: 36 U/L — ABNORMAL HIGH (ref 10–35)
Albumin: 3.8 g/dL (ref 3.6–5.1)
Alkaline phosphatase (APISO): 137 U/L — ABNORMAL HIGH (ref 31–125)
BUN: 8 mg/dL (ref 7–25)
CO2: 30 mmol/L (ref 20–32)
Calcium: 9.3 mg/dL (ref 8.6–10.2)
Chloride: 102 mmol/L (ref 98–110)
Creat: 0.66 mg/dL (ref 0.50–1.10)
Globulin: 3.2 g/dL (calc) (ref 1.9–3.7)
Glucose, Bld: 179 mg/dL — ABNORMAL HIGH (ref 65–99)
Potassium: 4.5 mmol/L (ref 3.5–5.3)
Sodium: 140 mmol/L (ref 135–146)
Total Bilirubin: 0.5 mg/dL (ref 0.2–1.2)
Total Protein: 7 g/dL (ref 6.1–8.1)

## 2020-10-28 LAB — WOUND CULTURE
MICRO NUMBER:: 11524656
SPECIMEN QUALITY:: ADEQUATE

## 2020-10-29 ENCOUNTER — Other Ambulatory Visit: Payer: Self-pay

## 2020-10-29 DIAGNOSIS — D72829 Elevated white blood cell count, unspecified: Secondary | ICD-10-CM

## 2020-11-04 ENCOUNTER — Other Ambulatory Visit: Payer: Self-pay

## 2020-11-04 ENCOUNTER — Encounter: Payer: Self-pay | Admitting: Family Medicine

## 2020-11-04 ENCOUNTER — Ambulatory Visit (INDEPENDENT_AMBULATORY_CARE_PROVIDER_SITE_OTHER): Payer: Medicare Other | Admitting: Family Medicine

## 2020-11-04 VITALS — BP 128/74 | HR 112 | Temp 97.9°F | Resp 18 | Ht 65.0 in | Wt >= 6400 oz

## 2020-11-04 DIAGNOSIS — D72829 Elevated white blood cell count, unspecified: Secondary | ICD-10-CM

## 2020-11-04 DIAGNOSIS — L0882 Omphalitis not of newborn: Secondary | ICD-10-CM

## 2020-11-04 LAB — CBC WITH DIFFERENTIAL/PLATELET
Absolute Monocytes: 920 cells/uL (ref 200–950)
Basophils Absolute: 40 cells/uL (ref 0–200)
Basophils Relative: 0.4 %
Eosinophils Absolute: 70 cells/uL (ref 15–500)
Eosinophils Relative: 0.7 %
HCT: 41.4 % (ref 35.0–45.0)
Hemoglobin: 12.5 g/dL (ref 11.7–15.5)
Lymphs Abs: 2690 cells/uL (ref 850–3900)
MCH: 25.4 pg — ABNORMAL LOW (ref 27.0–33.0)
MCHC: 30.2 g/dL — ABNORMAL LOW (ref 32.0–36.0)
MCV: 84.1 fL (ref 80.0–100.0)
MPV: 10.3 fL (ref 7.5–12.5)
Monocytes Relative: 9.2 %
Neutro Abs: 6280 cells/uL (ref 1500–7800)
Neutrophils Relative %: 62.8 %
Platelets: 416 10*3/uL — ABNORMAL HIGH (ref 140–400)
RBC: 4.92 10*6/uL (ref 3.80–5.10)
RDW: 15.1 % — ABNORMAL HIGH (ref 11.0–15.0)
Total Lymphocyte: 26.9 %
WBC: 10 10*3/uL (ref 3.8–10.8)

## 2020-11-04 NOTE — Patient Instructions (Signed)
Complete antibiotics Use the cream for another 2 weeks We will call with the WBC You need appointment with new PCP in 2 months

## 2020-11-04 NOTE — Progress Notes (Signed)
   Subjective:    Patient ID: Jasmin Castro, female    DOB: 1976/02/12, 45 y.o.   MRN: [id number]  Patient presents for Follow-up (Wound check- umbilical infection)   Pt here for wound check on omphalitis, she has 2 more days of oral  dosycycline antibiotics and using cream  She has not had any fever.  She has been clean her bellybutton states that she has not noted any pus.  Also she has not had any further bleeding.  He is type II.  Her last A1c was 6.3% in December.  At her last visit last week I increase her Trulicity to 3 mg once a week to help with appetite and weight weight is down 4 pounds    Review Of Systems:  GEN- denies fatigue, fever, weight loss,weakness, recent illness HEENT- denies eye drainage, change in vision, nasal discharge, CVS- denies chest pain, palpitations RESP- denies SOB, cough, wheeze ABD- denies N/V, change in stools, abd pain MSK- denies joint pain, muscle aches, injury Neuro- denies headache, dizziness, syncope, seizure activity       Objective:    BP 128/74   Pulse (!) 112   Temp 97.9 F (36.6 C) (Temporal)   Resp 18   Ht 5' 5"  (1.651 m)   Wt (!) 454 lb (205.9 kg)   SpO2 94%   BMI 75.55 kg/m  GEN- NAD, alert and oriented x3 CVS- RRR, no murmur RESP-CTAB ABD-NABS,soft,NT,ND, mild swelling umbilicus, previous ulcerations noted resolved, clear discharge at base of umbilicus, NT, noodor   EXT- mild ankle edema Pulses- Radial 2+         Assessment & Plan:      Problem List Items Addressed This Visit   None   Visit Diagnoses    Leukocytosis, unspecified type    -  Primary   Relevant Orders   CBC with Differential/Platelet   Omphalitis in adult       Infection is much improved, complete doxycycline, continue with cleaning, need to keep area dry, use cream for another 2 weeks, since she had recurrent infection      Note: This dictation was prepared with Dragon dictation along with smaller phrase technology. Any transcriptional  errors that result from this process are unintentional.

## 2020-11-06 DIAGNOSIS — F3130 Bipolar disorder, current episode depressed, mild or moderate severity, unspecified: Secondary | ICD-10-CM | POA: Diagnosis not present

## 2020-11-12 ENCOUNTER — Telehealth: Payer: Self-pay | Admitting: Family Medicine

## 2020-11-12 NOTE — Telephone Encounter (Signed)
[facility] called and stated that they do not carry bariatric shower chairs.

## 2020-11-12 NOTE — Telephone Encounter (Signed)
Prescription was sent to [facility].   Will re-send to [employer].

## 2020-11-13 NOTE — Telephone Encounter (Signed)
Pt will call insurance company to see which dme company will pay for needs

## 2020-11-18 ENCOUNTER — Telehealth: Payer: Self-pay

## 2020-11-18 NOTE — Telephone Encounter (Signed)
Patient called needs rx for walker and transfer bench sent to [place]

## 2020-11-20 NOTE — Telephone Encounter (Signed)
Will call [facility] for fax number.    [phone] telephone.

## 2020-11-21 ENCOUNTER — Other Ambulatory Visit: Payer: Self-pay | Admitting: Family Medicine

## 2020-11-21 NOTE — Telephone Encounter (Signed)
Prescription sent

## 2020-12-10 ENCOUNTER — Ambulatory Visit: Payer: Medicare Other | Admitting: Internal Medicine

## 2020-12-23 DIAGNOSIS — F3130 Bipolar disorder, current episode depressed, mild or moderate severity, unspecified: Secondary | ICD-10-CM | POA: Diagnosis not present

## 2020-12-26 ENCOUNTER — Encounter: Payer: Self-pay | Admitting: Internal Medicine

## 2020-12-26 ENCOUNTER — Ambulatory Visit (INDEPENDENT_AMBULATORY_CARE_PROVIDER_SITE_OTHER): Payer: Medicare Other | Admitting: Internal Medicine

## 2020-12-26 ENCOUNTER — Other Ambulatory Visit (HOSPITAL_COMMUNITY)
Admission: RE | Admit: 2020-12-26 | Discharge: 2020-12-26 | Disposition: A | Discharge status: Home / Self Care | Payer: Medicare Other | Source: Ambulatory Visit | Attending: Internal Medicine | Admitting: Internal Medicine

## 2020-12-26 ENCOUNTER — Other Ambulatory Visit: Payer: Self-pay

## 2020-12-26 VITALS — BP 129/75 | HR 115 | Resp 22 | Ht 65.0 in | Wt >= 6400 oz

## 2020-12-26 DIAGNOSIS — I10 Essential (primary) hypertension: Secondary | ICD-10-CM | POA: Diagnosis not present

## 2020-12-26 DIAGNOSIS — Z0100 Encounter for examination of eyes and vision without abnormal findings: Secondary | ICD-10-CM | POA: Diagnosis not present

## 2020-12-26 DIAGNOSIS — N390 Urinary tract infection, site not specified: Secondary | ICD-10-CM | POA: Diagnosis not present

## 2020-12-26 DIAGNOSIS — Z7689 Persons encountering health services in other specified circumstances: Secondary | ICD-10-CM | POA: Diagnosis not present

## 2020-12-26 DIAGNOSIS — H6122 Impacted cerumen, left ear: Secondary | ICD-10-CM

## 2020-12-26 DIAGNOSIS — F411 Generalized anxiety disorder: Secondary | ICD-10-CM

## 2020-12-26 DIAGNOSIS — E782 Mixed hyperlipidemia: Secondary | ICD-10-CM | POA: Diagnosis not present

## 2020-12-26 DIAGNOSIS — F329 Major depressive disorder, single episode, unspecified: Secondary | ICD-10-CM | POA: Diagnosis not present

## 2020-12-26 DIAGNOSIS — E1169 Type 2 diabetes mellitus with other specified complication: Secondary | ICD-10-CM | POA: Diagnosis not present

## 2020-12-26 DIAGNOSIS — Z2821 Immunization not carried out because of patient refusal: Secondary | ICD-10-CM

## 2020-12-26 DIAGNOSIS — J4520 Mild intermittent asthma, uncomplicated: Secondary | ICD-10-CM

## 2020-12-26 DIAGNOSIS — R300 Dysuria: Secondary | ICD-10-CM | POA: Diagnosis not present

## 2020-12-26 DIAGNOSIS — Z124 Encounter for screening for malignant neoplasm of cervix: Secondary | ICD-10-CM | POA: Diagnosis not present

## 2020-12-26 LAB — POCT URINALYSIS DIP (CLINITEK)
Glucose, UA: NEGATIVE mg/dL
Nitrite, UA: POSITIVE — AB
POC PROTEIN,UA: 100 — AB
Spec Grav, UA: >=1.03 — AB (ref 1.010–1.025)
Urobilinogen, UA: 0.2 E.U./dL
pH, UA: 6 (ref 5.0–8.0)

## 2020-12-26 MED ORDER — SULFAMETHOXAZOLE-TRIMETHOPRIM 800-160 MG PO TABS: 1.0000 | ORAL_TABLET | Freq: Two times a day (BID) | ORAL | 0 refills | Status: DC

## 2020-12-26 NOTE — Patient Instructions (Signed)
Please start taking Bactrim as prescribed.  Please continue other medications as prescribed.  Please continue to follow low carb and low sodium diet.

## 2020-12-27 DIAGNOSIS — N390 Urinary tract infection, site not specified: Secondary | ICD-10-CM | POA: Insufficient documentation

## 2020-12-27 DIAGNOSIS — Z7689 Persons encountering health services in other specified circumstances: Secondary | ICD-10-CM | POA: Insufficient documentation

## 2020-12-27 NOTE — Assessment & Plan Note (Signed)
UA reviewed Started Bactrim Check urine culture Also c/o perineal area itching, check cervicovaginal smear

## 2020-12-27 NOTE — Assessment & Plan Note (Signed)
On Ativan PRN, prescribed by Psychiatry

## 2020-12-27 NOTE — Assessment & Plan Note (Signed)
BP Readings from Last 1 Encounters:  12/26/20 129/75   Well-controlled with Enalapril Counseled for compliance with the medications Advised DASH diet and moderate exercise/walking as tolerated

## 2020-12-27 NOTE — Assessment & Plan Note (Signed)
Care established Previous chart reviewed History and medications reviewed with the patient

## 2020-12-27 NOTE — Assessment & Plan Note (Signed)
BMI of more than 77 impacting ambulation and overall functional status Currently independent with ADLs, drives herself and sometimes gets help from family member Understands risks of severe obesity, agrees to follow low carb diet Small, frequent meals If she loses some weight with medical supervision, would refer to Bariatric surgery.

## 2020-12-27 NOTE — Progress Notes (Signed)
New Patient Office Visit  Subjective:  Patient ID: Jasmin Castro, female    DOB: 1976-06-16  Age: 45 y.o. MRN: [id number]  CC:  Chief Complaint  Patient presents with  . New Patient (Initial Visit)    New patient former dr Buelah Manis pt thinks she has a uti pressure when urinating itching and burning for about 2 weeks    HPI Jasmin Castro is a 45 year old female with past medical history of HTN, DM, morbid obesity with metabolic syndrome, HLD, asthma and depression with anxiety who presents for establishing care.  She is a former patient of Dr. Buelah Manis.  She complains of dysuria and perineal area burning associated with suprapubic pain for last 2 weeks.  She denies any fever, chills, nausea, vomiting, hematuria or flank pain.  She also complains of left ear pain without any discharge.  She is concerned if she has excess wax.  BP is well-controlled. Takes medications regularly. Patient denies headache, dizziness, chest pain, dyspnea or palpitations.  Her BMI is >77.  Her ambulation and overall physical activity is limited due to morbid obesity.  She is currently independent with her ADLs.  She is able to drive and gets help for transportation from her family members at times.  Chart review suggests that she used to rely on prepared foods - mostly fast food -as her single large meal.  Today, she states that she prepares food at home.  She is on Trulicity for DM.  She has a history of depression with anxiety and she follows up with psychiatrist.  She is on Pristiq and olanzapine as well as as needed Ativan.  She denies anhedonia, SI, or HI.  She denies taking Covid vaccine.  Past Medical History:  Diagnosis Date  . Asthma   . Depression   . Hyperlipemia   . Hypertension   . Obesity   . Panic attacks   . Paranoid ([place])   . PCOS (polycystic ovarian syndrome)   . Prediabetes   . Urge incontinence 07/19/2013    Past Surgical History:  Procedure Laterality Date  . HYSTEROSCOPY  DIAGNOSTIC    . insulin resistant      Family History  Problem Relation Age of Onset  . Hyperlipidemia Mother   . Hyperlipidemia Father   . Cancer Father        colon and prostate , brain tumor  . Diabetes Sister        prediatic  . Hyperlipidemia Brother   . Hypertension Brother     Social History   Socioeconomic History  . Marital status: Single    Spouse name: Not on file  . Number of children: Not on file  . Years of education: Not on file  . Highest education level: Not on file  Occupational History  . Not on file  Tobacco Use  . Smoking status: Never Smoker  . Smokeless tobacco: Never Used  Substance and Sexual Activity  . Alcohol use: No  . Drug use: No  . Sexual activity: Never    Birth control/protection: None  Other Topics Concern  . Not on file  Social History Narrative  . Not on file   Social Determinants of Health   Financial Resource Strain: Not on file  Food Insecurity: Not on file  Transportation Needs: Not on file  Physical Activity: Not on file  Stress: Not on file  Social Connections: Not on file  Intimate Partner Violence: Not on file    ROS Review of  Systems  Constitutional: Negative for chills and fever.  HENT: Positive for ear pain. Negative for congestion, sinus pressure, sinus pain and sore throat.   Eyes: Negative for pain and discharge.  Respiratory: Positive for cough. Negative for shortness of breath.   Cardiovascular: Negative for chest pain and palpitations.  Gastrointestinal: Negative for abdominal pain, constipation, diarrhea, nausea and vomiting.  Endocrine: Negative for polydipsia and polyuria.  Genitourinary: Positive for dysuria. Negative for hematuria.  Musculoskeletal: Negative for neck pain and neck stiffness.  Skin: Negative for rash.  Neurological: Negative for dizziness and weakness.  Psychiatric/Behavioral: Negative for agitation and behavioral problems.    Objective:   Today's Vitals: BP 129/75 (BP  Location: Right Arm, Cuff Size: Large)   Pulse (!) 115   Resp (!) 22   Ht 5' 5"  (1.651 m)   Wt (!) 464 lb 6.4 oz (210.7 kg)   SpO2 95%   BMI 77.28 kg/m   Physical Exam Vitals reviewed.  Constitutional:      General: She is not in acute distress.    Appearance: She is obese. She is not diaphoretic.  HENT:     Head: Normocephalic and atraumatic.     Nose: Nose normal.     Mouth/Throat:     Mouth: Mucous membranes are moist.  Eyes:     General: No scleral icterus.    Extraocular Movements: Extraocular movements intact.     Pupils: Pupils are equal, round, and reactive to light.  Cardiovascular:     Rate and Rhythm: Normal rate and regular rhythm.     Pulses: Normal pulses.     Heart sounds: Normal heart sounds. No murmur heard.   Pulmonary:     Breath sounds: Normal breath sounds. No wheezing or rales.  Abdominal:     Palpations: Abdomen is soft.     Tenderness: There is no abdominal tenderness.  Musculoskeletal:     Cervical back: Neck supple. No tenderness.     Right lower leg: No edema.     Left lower leg: No edema.  Skin:    General: Skin is warm.     Findings: No rash.  Neurological:     General: No focal deficit present.     Mental Status: She is alert and oriented to person, place, and time.  Psychiatric:        Mood and Affect: Mood normal.        Behavior: Behavior normal.     Assessment & Plan:   Problem List Items Addressed This Visit      Cardiovascular and Mediastinum   Hypertension     Endocrine   Type 2 diabetes mellitus with other specified complication ([place])     Other   Morbid obesity ([place])    Other Visit Diagnoses    Encounter to establish care    -  Primary   Urinary tract infection without hematuria, site unspecified       Relevant Medications   sulfamethoxazole-trimethoprim (BACTRIM DS) 800-160 MG tablet   Other Relevant Orders   POCT URINALYSIS DIP (CLINITEK) (Completed)   Urine cytology ancillary only   Urine Culture    Routine cervical smear       Relevant Orders   Ambulatory referral to Obstetrics / Gynecology   Routine eye exam       Relevant Orders   Ambulatory referral to Ophthalmology      Outpatient Encounter Medications as of 12/26/2020  Medication Sig  . acetaminophen (TYLENOL) 325 MG tablet Take  650 mg by mouth every 6 (six) hours as needed.  Marland Kitchen albuterol (VENTOLIN HFA) 108 (90 Base) MCG/ACT inhaler INHALE TWO PUFFS INTO THE LUNGS EVERY 6 HOURS AS NEEDED FOR FOR WHEEZING AND SHORTNESS OF BREATH.  Marland Kitchen Cetirizine HCl (ZYRTEC ALLERGY) 10 MG TBDP Take 10 mg by mouth at bedtime.  . clotrimazole (CLOTRIMAZOLE ANTI-FUNGAL) 1 % cream Apply 1 application topically 2 (two) times daily. To foot  . Dulaglutide (TRULICITY) 3 OZ/3.0QM SOPN Inject 3 mg as directed once a week.  . enalapril (VASOTEC) 20 MG tablet Take 1 tablet (20 mg total) by mouth daily.  . fluticasone (FLONASE) 50 MCG/ACT nasal spray USE 2 SPRAYS IN EACH NOSTRIL ONCE DAILY. SHAKE GENTLY BEFORE USING.  Marland Kitchen ibuprofen (ADVIL,MOTRIN) 200 MG tablet Take 400 mg by mouth every 8 (eight) hours as needed.  Marland Kitchen LORazepam (ATIVAN) 1 MG tablet Take 1 mg by mouth at bedtime.  . meclizine (ANTIVERT) 12.5 MG tablet TAKE ONE TABLET BY MOUTH THREE TIMES DAILY AS NEEDED FOR DIZZINESS OR NAUSEA  . mupirocin ointment (BACTROBAN) 2 % Apply topically 2 (two) times daily. To naval  . nystatin cream (MYCOSTATIN) Apply 1 application topically 2 (two) times daily.  Marland Kitchen OLANZapine (ZYPREXA) 15 MG tablet Take 15 mg by mouth at bedtime.  . Omega-3 Fatty Acids (FISH OIL) 1000 MG CAPS Take 1 capsule by mouth daily.  Marland Kitchen PRISTIQ 100 MG 24 hr tablet Take 100 mg by mouth at bedtime.  . rosuvastatin (CRESTOR) 10 MG tablet Take 1 tablet (10 mg total) by mouth 3 (three) times a week.  . sulfamethoxazole-trimethoprim (BACTRIM DS) 800-160 MG tablet Take 1 tablet by mouth 2 (two) times daily.  . [DISCONTINUED] clotrimazole-betamethasone (LOTRISONE) cream APPLY TOPICALLY TWICE DAILY (Patient  not taking: Reported on 12/26/2020)  . [DISCONTINUED] doxycycline (VIBRA-TABS) 100 MG tablet Take 1 tablet (100 mg total) by mouth 2 (two) times daily. (Patient not taking: Reported on 12/26/2020)  . [DISCONTINUED] montelukast (SINGULAIR) 10 MG tablet Take 1 tablet (10 mg total) by mouth at bedtime. For asthma/allergies (Patient not taking: Reported on 12/26/2020)   No facility-administered encounter medications on file as of 12/26/2020.    Follow-up: Return in about 4 months (around 04/27/2021) for HTN, DM and blood tests.   Lindell Spar, MD

## 2020-12-27 NOTE — Assessment & Plan Note (Signed)
Albuterol PRN

## 2020-12-27 NOTE — Assessment & Plan Note (Signed)
Lab Results  Component Value Date   HGBA1C 6.2 (H) 08/28/2020   Well-controlled with Trulicity On statin and ACEi Diabetic foot exam: Today Diabetic eye exam: Advised to follow up with Ophthalmology for diabetic eye exam

## 2020-12-27 NOTE — Assessment & Plan Note (Signed)
Part of metabolic syndrome On statin

## 2020-12-27 NOTE — Assessment & Plan Note (Signed)
Follows up with psychiatry On Pristiq and Olanzapine

## 2020-12-31 LAB — URINE CYTOLOGY ANCILLARY ONLY
Bacterial Vaginitis (gardnerella): NEGATIVE
Candida Glabrata: NEGATIVE
Candida Vaginitis: NEGATIVE
Comment: NEGATIVE
Comment: NEGATIVE
Comment: NEGATIVE

## 2021-01-01 ENCOUNTER — Ambulatory Visit: Payer: Medicare Other | Admitting: Orthopaedic Surgery

## 2021-01-01 ENCOUNTER — Other Ambulatory Visit: Payer: Self-pay

## 2021-01-01 ENCOUNTER — Other Ambulatory Visit: Payer: Self-pay | Admitting: Internal Medicine

## 2021-01-01 DIAGNOSIS — N390 Urinary tract infection, site not specified: Secondary | ICD-10-CM

## 2021-01-01 LAB — URINE CULTURE

## 2021-01-01 MED ORDER — NITROFURANTOIN MONOHYD MACRO 100 MG PO CAPS
100.0000 mg | ORAL_CAPSULE | Freq: Two times a day (BID) | ORAL | 0 refills | Status: DC
Start: 2021-01-01 — End: 2021-04-29

## 2021-01-28 ENCOUNTER — Other Ambulatory Visit: Payer: Self-pay | Admitting: *Deleted

## 2021-01-28 MED ORDER — TRULICITY 3 MG/0.5ML ~~LOC~~ SOAJ: 3.0000 mg | SUBCUTANEOUS | 3 refills | Status: DC

## 2021-02-13 ENCOUNTER — Encounter: Payer: Medicare Other | Admitting: Adult Health

## 2021-02-24 DIAGNOSIS — F3130 Bipolar disorder, current episode depressed, mild or moderate severity, unspecified: Secondary | ICD-10-CM | POA: Diagnosis not present

## 2021-03-04 ENCOUNTER — Other Ambulatory Visit: Payer: Self-pay

## 2021-03-04 ENCOUNTER — Encounter: Payer: Self-pay | Admitting: Nurse Practitioner

## 2021-03-04 ENCOUNTER — Telehealth (INDEPENDENT_AMBULATORY_CARE_PROVIDER_SITE_OTHER): Payer: Medicare Other | Admitting: Nurse Practitioner

## 2021-03-04 VITALS — Ht 65.0 in | Wt >= 6400 oz

## 2021-03-04 DIAGNOSIS — J069 Acute upper respiratory infection, unspecified: Secondary | ICD-10-CM

## 2021-03-04 MED ORDER — BENZONATATE 100 MG PO CAPS: 100.0000 mg | ORAL_CAPSULE | Freq: Two times a day (BID) | ORAL | 0 refills | Status: DC | PRN

## 2021-03-04 MED ORDER — AZITHROMYCIN 250 MG PO TABS: ORAL_TABLET | ORAL | 0 refills | Status: DC

## 2021-03-04 NOTE — Assessment & Plan Note (Addendum)
-  she states COVID test was negative -Rx. z-pack and tessalon -no wheezing heard over the phone today, if that persists, would consider albuterol

## 2021-03-04 NOTE — Progress Notes (Signed)
Acute Office Visit  Subjective:    Patient ID: Jasmin Castro, female    DOB: 1976-05-07, 45 y.o.   MRN: [id number]  Chief Complaint  Patient presents with   Sore Throat    Ongoing x4-5 days. Eating/drinking with no issues.   Otalgia    Both ears, ongoing x4-5 days.   Cough    Productive, ongoing x4-5 days.    Sore Throat  Associated symptoms include congestion, coughing, ear pain, headaches and shortness of breath.  Otalgia  Associated symptoms include coughing, headaches, rhinorrhea and a sore throat.  Cough Associated symptoms include ear pain, headaches, rhinorrhea, a sore throat, shortness of breath and wheezing. Pertinent negatives include no chills or fever.  Patient is in today for sick visit. She reports coughing, ear pain, and headache. Symptoms started 4-5 days ago. She has tried taking tylenol, ibuprofen, flonase and benadryl. She took a COVID test 3 days ago that was negative.  Past Medical History:  Diagnosis Date   Asthma    Depression    Hyperlipemia    Hypertension    Obesity    Panic attacks    Paranoid ([place])    PCOS (polycystic ovarian syndrome)    Prediabetes    Urge incontinence 07/19/2013    Past Surgical History:  Procedure Laterality Date   HYSTEROSCOPY DIAGNOSTIC     insulin resistant      Family History  Problem Relation Age of Onset   Hyperlipidemia Mother    Hyperlipidemia Father    Cancer Father        colon and prostate , brain tumor   Diabetes Sister        prediatic   Hyperlipidemia Brother    Hypertension Brother     Social History   Socioeconomic History   Marital status: Single    Spouse name: Not on file   Number of children: Not on file   Years of education: Not on file   Highest education level: Not on file  Occupational History   Not on file  Tobacco Use   Smoking status: Never   Smokeless tobacco: Never  Substance and Sexual Activity   Alcohol use: No   Drug use: No   Sexual activity: Never    Birth  control/protection: None  Other Topics Concern   Not on file  Social History Narrative   Not on file   Social Determinants of Health   Financial Resource Strain: Not on file  Food Insecurity: Not on file  Transportation Needs: Not on file  Physical Activity: Not on file  Stress: Not on file  Social Connections: Not on file  Intimate Partner Violence: Not on file    Outpatient Medications Prior to Visit  Medication Sig Dispense Refill   acetaminophen (TYLENOL) 325 MG tablet Take 650 mg by mouth every 6 (six) hours as needed.     albuterol (VENTOLIN HFA) 108 (90 Base) MCG/ACT inhaler INHALE TWO PUFFS INTO THE LUNGS EVERY 6 HOURS AS NEEDED FOR FOR WHEEZING AND SHORTNESS OF BREATH. 8.5 g 3   Cetirizine HCl (ZYRTEC ALLERGY) 10 MG TBDP Take 10 mg by mouth at bedtime. 30 tablet 0   clotrimazole (CLOTRIMAZOLE ANTI-FUNGAL) 1 % cream Apply 1 application topically 2 (two) times daily. To foot 30 g 0   Dulaglutide (TRULICITY) 3 ZX/2.8FV SOPN Inject 3 mg as directed once a week. 2 mL 3   enalapril (VASOTEC) 20 MG tablet Take 1 tablet (20 mg total) by mouth daily. 30 tablet  5   fluticasone (FLONASE) 50 MCG/ACT nasal spray USE 2 SPRAYS IN EACH NOSTRIL ONCE DAILY. SHAKE GENTLY BEFORE USING. 16 g 6   ibuprofen (ADVIL,MOTRIN) 200 MG tablet Take 400 mg by mouth every 8 (eight) hours as needed.     LORazepam (ATIVAN) 1 MG tablet Take 1 mg by mouth at bedtime.     meclizine (ANTIVERT) 12.5 MG tablet TAKE ONE TABLET BY MOUTH THREE TIMES DAILY AS NEEDED FOR DIZZINESS OR NAUSEA 30 tablet 0   mupirocin ointment (BACTROBAN) 2 % Apply topically 2 (two) times daily. To naval 22 g 0   nitrofurantoin, macrocrystal-monohydrate, (MACROBID) 100 MG capsule Take 1 capsule (100 mg total) by mouth 2 (two) times daily. 10 capsule 0   nystatin cream (MYCOSTATIN) Apply 1 application topically 2 (two) times daily. 30 g 2   OLANZapine (ZYPREXA) 15 MG tablet Take 15 mg by mouth at bedtime.     Omega-3 Fatty Acids (FISH OIL)  1000 MG CAPS Take 1 capsule by mouth daily.     PRISTIQ 100 MG 24 hr tablet Take 100 mg by mouth at bedtime.     rosuvastatin (CRESTOR) 10 MG tablet Take 1 tablet (10 mg total) by mouth 3 (three) times a week. 30 tablet 3   No facility-administered medications prior to visit.    Allergies  Allergen Reactions   Keflex [Cephalexin]     Headache    Lodine [Etodolac] Other (See Comments)    Dizziness    Seroquel [Quetiapine Fumarate]     Itching     Review of Systems  Constitutional:  Negative for chills, fatigue and fever.  HENT:  Positive for congestion, ear pain, rhinorrhea and sore throat. Negative for sinus pressure and sinus pain.   Respiratory:  Positive for cough, shortness of breath and wheezing.   Cardiovascular: Negative.   Neurological:  Positive for headaches.      Objective:    Physical Exam  Ht 5' 5"  (1.651 m)   Wt (!) 456 lb (206.8 kg)   LMP  (LMP Unknown)   BMI 75.88 kg/m  Wt Readings from Last 3 Encounters:  03/04/21 (!) 456 lb (206.8 kg)  12/26/20 (!) 464 lb 6.4 oz (210.7 kg)  11/04/20 (!) 454 lb (205.9 kg)    Health Maintenance Due  Topic Date Due   PNEUMOCOCCAL POLYSACCHARIDE VACCINE AGE 61-64 HIGH RISK  Never done   OPHTHALMOLOGY EXAM  Never done   TETANUS/TDAP  Never done   PAP SMEAR-Modifier  07/19/2016   COLONOSCOPY (Pts 45-43yr Insurance coverage will need to be confirmed)  Never done   HEMOGLOBIN A1C  02/26/2021    There are no preventive care reminders to display for this patient.   Lab Results  Component Value Date   TSH 3.69 03/29/2020   Lab Results  Component Value Date   WBC 10.0 11/04/2020   HGB 12.5 11/04/2020   HCT 41.4 11/04/2020   MCV 84.1 11/04/2020   PLT 416 (H) 11/04/2020   Lab Results  Component Value Date   NA 140 10/25/2020   K 4.5 10/25/2020   CO2 30 10/25/2020   GLUCOSE 179 (H) 10/25/2020   BUN 8 10/25/2020   CREATININE 0.66 10/25/2020   BILITOT 0.5 10/25/2020   ALKPHOS 129 (H) 01/26/2017   AST 36  (H) 10/25/2020   ALT 32 (H) 10/25/2020   PROT 7.0 10/25/2020   ALBUMIN 3.8 01/26/2017   CALCIUM 9.3 10/25/2020   Lab Results  Component Value Date   CHOL 260 (  H) 08/28/2020   Lab Results  Component Value Date   HDL 51 08/28/2020   Lab Results  Component Value Date   LDLCALC 184 (H) 08/28/2020   Lab Results  Component Value Date   TRIG 121 08/28/2020   Lab Results  Component Value Date   CHOLHDL 5.1 (H) 08/28/2020   Lab Results  Component Value Date   HGBA1C 6.2 (H) 08/28/2020       Assessment & Plan:   Problem List Items Addressed This Visit       Respiratory   URI (upper respiratory infection) - Primary    -she states COVID test was negative -Rx. z-pack and tessalon -no wheezing heard over the phone today, if that persists, would consider albuterol        Relevant Medications   azithromycin (ZITHROMAX) 250 MG tablet   benzonatate (TESSALON) 100 MG capsule     Meds ordered this encounter  Medications   azithromycin (ZITHROMAX) 250 MG tablet    Sig: Please dispense as a z-pack    Dispense:  6 tablet    Refill:  0   benzonatate (TESSALON) 100 MG capsule    Sig: Take 1 capsule (100 mg total) by mouth 2 (two) times daily as needed for cough.    Dispense:  20 capsule    Refill:  0    Date:  03/04/2021   Location of Patient: Home Location of Provider: Office Consent was obtain for visit to be over via telehealth. I verified that I am speaking with the correct person using two identifiers.  I connected with  AIYANNA AWTREY on 03/04/21 via telephone and verified that I am speaking with the correct person using two identifiers.   I discussed the limitations of evaluation and management by telemedicine. The patient expressed understanding and agreed to proceed.  Time spent: 7 minutes   Noreene Larsson, NP

## 2021-03-24 DIAGNOSIS — F3130 Bipolar disorder, current episode depressed, mild or moderate severity, unspecified: Secondary | ICD-10-CM | POA: Diagnosis not present

## 2021-04-14 DIAGNOSIS — M7502 Adhesive capsulitis of left shoulder: Secondary | ICD-10-CM | POA: Diagnosis not present

## 2021-04-14 DIAGNOSIS — M170 Bilateral primary osteoarthritis of knee: Secondary | ICD-10-CM | POA: Diagnosis not present

## 2021-04-21 DIAGNOSIS — F3130 Bipolar disorder, current episode depressed, mild or moderate severity, unspecified: Secondary | ICD-10-CM | POA: Diagnosis not present

## 2021-04-29 ENCOUNTER — Encounter: Payer: Self-pay | Admitting: Internal Medicine

## 2021-04-29 ENCOUNTER — Telehealth (INDEPENDENT_AMBULATORY_CARE_PROVIDER_SITE_OTHER): Payer: Medicare Other | Admitting: Internal Medicine

## 2021-04-29 ENCOUNTER — Other Ambulatory Visit: Payer: Self-pay

## 2021-04-29 DIAGNOSIS — Z0100 Encounter for examination of eyes and vision without abnormal findings: Secondary | ICD-10-CM

## 2021-04-29 DIAGNOSIS — K429 Umbilical hernia without obstruction or gangrene: Secondary | ICD-10-CM

## 2021-04-29 DIAGNOSIS — E782 Mixed hyperlipidemia: Secondary | ICD-10-CM

## 2021-04-29 DIAGNOSIS — E119 Type 2 diabetes mellitus without complications: Secondary | ICD-10-CM | POA: Diagnosis not present

## 2021-04-29 DIAGNOSIS — Z1159 Encounter for screening for other viral diseases: Secondary | ICD-10-CM

## 2021-04-29 DIAGNOSIS — E1169 Type 2 diabetes mellitus with other specified complication: Secondary | ICD-10-CM

## 2021-04-29 MED ORDER — ROSUVASTATIN CALCIUM 10 MG PO TABS: 10.0000 mg | ORAL_TABLET | ORAL | 3 refills | Status: DC

## 2021-04-29 NOTE — Assessment & Plan Note (Signed)
Reports no recent change in weight  BMI of more than 77 impacting ambulation and overall functional status Currently independent with ADLs, drives herself and sometimes gets help from family member Understands risks of severe obesity, agrees to follow low carb diet Small, frequent meals If she loses some weight with medical supervision, would refer to Bariatric surgery. On Zyprexa, which makes it difficult to lose weight.

## 2021-04-29 NOTE — Patient Instructions (Signed)
Please continue to take medications as prescribed.  Please get fasting blood tests done within a week.

## 2021-04-29 NOTE — Assessment & Plan Note (Signed)
Part of metabolic syndrome Has not been taking Crestor, advised to continue, refills provided

## 2021-04-29 NOTE — Assessment & Plan Note (Signed)
Usually benign If symptomatic, will refer to General surgery

## 2021-04-29 NOTE — Assessment & Plan Note (Signed)
Lab Results  Component Value Date   HGBA1C 6.2 (H) 08/28/2020   Well-controlled with Trulicity On statin and ACEi Diabetic eye exam: Advised to follow up with Ophthalmology for diabetic eye exam

## 2021-04-29 NOTE — Progress Notes (Signed)
Virtual Visit via Telephone Note   This visit type was conducted due to national recommendations for restrictions regarding the COVID-19 Pandemic (e.g. social distancing) in an effort to limit this patient's exposure and mitigate transmission in our community.  Due to her co-morbid illnesses, this patient is at least at moderate risk for complications without adequate follow up.  This format is felt to be most appropriate for this patient at this time.  The patient did not have access to video technology/had technical difficulties with video requiring transitioning to audio format only (telephone).  All issues noted in this document were discussed and addressed.  No physical exam could be performed with this format.  Evaluation Performed:  Follow-up visit  Date:  04/29/2021   ID:  Jasmin Castro, DOB 07-15-1976, MRN [id number]  Patient Location: Home Provider Location: Office/Clinic  Participants: Patient Location of Patient: Home Location of Provider: Telehealth Consent was obtain for visit to be over via telehealth. I verified that I am speaking with the correct person using two identifiers.  PCP:  Lindell Spar, MD   Chief Complaint:  Follow up of her chronic medical conditions  History of Present Illness:    Jasmin Castro is a 45 y.o. female with past medical history of HTN, DM, morbid obesity with metabolic syndrome, HLD, asthma and depression with anxiety who presents for follow up of her chronic medical conditions.  DM: Has been taking Trulicity. Denies any polyuria or polyphagia. Has not had recent HbA1C check. She has stopped taking Crestor as she thinks it was causing knee pain.  BP is well-controlled. Takes medications regularly. Patient denies headache, dizziness, chest pain, dyspnea or palpitations.  Her BMI is >77.  Her ambulation and overall physical activity is limited due to morbid obesity.  She is currently independent with her ADLs.  She is able to drive and  gets help for transportation from her family members at times.  Chart review suggests that she used to rely on prepared foods - mostly fast food -as her single large meal.  Today, she states that she prepares food at home.  She is on Trulicity for DM. She prefers to wait for Bariatric surgery referral for now.  She mentions having Umbilical hernia, which was not causing any symptoms till now. She had skin reaction over hernia about 2 weeks ago, which has since resolved. Denies any nausea, vomiting, abdominal pain, diarrhea, melena, hematochezia or local warmth or erythema currently.  The patient does not have symptoms concerning for COVID-19 infection (fever, chills, cough, or new shortness of breath).   Past Medical, Surgical, Social History, Allergies, and Medications have been Reviewed.  Past Medical History:  Diagnosis Date   Asthma    Depression    Hyperlipemia    Hypertension    Obesity    Panic attacks    Paranoid ([place])    PCOS (polycystic ovarian syndrome)    Prediabetes    Urge incontinence 07/19/2013   Past Surgical History:  Procedure Laterality Date   HYSTEROSCOPY DIAGNOSTIC     insulin resistant       Current Meds  Medication Sig   acetaminophen (TYLENOL) 325 MG tablet Take 650 mg by mouth every 6 (six) hours as needed.   albuterol (VENTOLIN HFA) 108 (90 Base) MCG/ACT inhaler INHALE TWO PUFFS INTO THE LUNGS EVERY 6 HOURS AS NEEDED FOR FOR WHEEZING AND SHORTNESS OF BREATH.   benzonatate (TESSALON) 100 MG capsule Take 1 capsule (100 mg total) by  mouth 2 (two) times daily as needed for cough.   Cetirizine HCl (ZYRTEC ALLERGY) 10 MG TBDP Take 10 mg by mouth at bedtime.   clotrimazole (CLOTRIMAZOLE ANTI-FUNGAL) 1 % cream Apply 1 application topically 2 (two) times daily. To foot   Dulaglutide (TRULICITY) 3 KD/9.8PJ SOPN Inject 3 mg as directed once a week.   enalapril (VASOTEC) 20 MG tablet Take 1 tablet (20 mg total) by mouth daily.   fluticasone (FLONASE) 50 MCG/ACT nasal  spray USE 2 SPRAYS IN EACH NOSTRIL ONCE DAILY. SHAKE GENTLY BEFORE USING.   IBU 600 MG tablet Take 600 mg by mouth 2 (two) times daily as needed.   LORazepam (ATIVAN) 1 MG tablet Take 1 mg by mouth at bedtime.   meclizine (ANTIVERT) 12.5 MG tablet TAKE ONE TABLET BY MOUTH THREE TIMES DAILY AS NEEDED FOR DIZZINESS OR NAUSEA   mupirocin ointment (BACTROBAN) 2 % Apply topically 2 (two) times daily. To naval   nystatin cream (MYCOSTATIN) Apply 1 application topically 2 (two) times daily.   OLANZapine (ZYPREXA) 15 MG tablet Take 15 mg by mouth at bedtime.   Omega-3 Fatty Acids (FISH OIL) 1000 MG CAPS Take 1 capsule by mouth daily.   PRISTIQ 100 MG 24 hr tablet Take 100 mg by mouth at bedtime.     Allergies:   Keflex [cephalexin], Lodine [etodolac], and Seroquel [quetiapine fumarate]   ROS:   Please see the history of present illness.     All other systems reviewed and are negative.   Labs/Other Tests and Data Reviewed:    Recent Labs: 10/25/2020: ALT 32; BUN 8; Creat 0.66; Potassium 4.5; Sodium 140 11/04/2020: Hemoglobin 12.5; Platelets 416   Recent Lipid Panel Lab Results  Component Value Date/Time   CHOL 260 (H) 08/28/2020 10:34 AM   TRIG 121 08/28/2020 10:34 AM   HDL 51 08/28/2020 10:34 AM   CHOLHDL 5.1 (H) 08/28/2020 10:34 AM   LDLCALC 184 (H) 08/28/2020 10:34 AM    Wt Readings from Last 3 Encounters:  03/04/21 (!) 456 lb (206.8 kg)  12/26/20 (!) 464 lb 6.4 oz (210.7 kg)  11/04/20 (!) 454 lb (205.9 kg)     ASSESSMENT & PLAN:    Type 2 diabetes mellitus with other specified complication (HCC) Lab Results  Component Value Date   HGBA1C 6.2 (H) 08/28/2020   Well-controlled with Trulicity On statin and ACEi Diabetic eye exam: Advised to follow up with Ophthalmology for diabetic eye exam  Hyperlipidemia Part of metabolic syndrome Has not been taking Crestor, advised to continue, refills provided  Morbid obesity ([place]) Reports no recent change in weight  BMI of more  than 77 impacting ambulation and overall functional status Currently independent with ADLs, drives herself and sometimes gets help from family member Understands risks of severe obesity, agrees to follow low carb diet Small, frequent meals If she loses some weight with medical supervision, would refer to Bariatric surgery. On Zyprexa, which makes it difficult to lose weight.  Umbilical hernia without obstruction and without gangrene Usually benign If symptomatic, will refer to General surgery   Time:   Today, I have spent 17 minutes reviewing the chart, including problem list, medications, and with the patient with telehealth technology discussing the above problems.   Medication Adjustments/Labs and Tests Ordered: Current medicines are reviewed at length with the patient today.  Concerns regarding medicines are outlined above.   Tests Ordered: Orders Placed This Encounter  Procedures   Ambulatory referral to Ophthalmology    Medication Changes: Meds  ordered this encounter  Medications   rosuvastatin (CRESTOR) 10 MG tablet    Sig: Take 1 tablet (10 mg total) by mouth 3 (three) times a week.    Dispense:  30 tablet    Refill:  3     Note: This dictation was prepared with Dragon dictation along with smaller phrase technology. Similar sounding words can be transcribed inadequately or may not be corrected upon review. Any transcriptional errors that result from this process are unintentional.      Disposition:  Follow up  Signed, Lindell Spar, MD  04/29/2021 2:09 PM     [place]

## 2021-05-01 ENCOUNTER — Ambulatory Visit: Payer: Medicare Other | Admitting: Internal Medicine

## 2021-05-02 ENCOUNTER — Telehealth: Payer: Medicare Other | Admitting: Internal Medicine

## 2021-05-08 ENCOUNTER — Telehealth: Payer: Self-pay

## 2021-05-08 NOTE — Telephone Encounter (Signed)
Pt informed

## 2021-05-08 NOTE — Telephone Encounter (Signed)
Patient called to let us know that the crestor made her lips swell so she has stopped taking it ph# [phone]

## 2021-05-22 DIAGNOSIS — E782 Mixed hyperlipidemia: Secondary | ICD-10-CM | POA: Diagnosis not present

## 2021-05-22 DIAGNOSIS — E1169 Type 2 diabetes mellitus with other specified complication: Secondary | ICD-10-CM | POA: Diagnosis not present

## 2021-05-23 LAB — CMP14+EGFR
ALT: 27 IU/L (ref 0–32)
AST: 27 IU/L (ref 0–40)
Albumin/Globulin Ratio: 1.6 (ref 1.2–2.2)
Albumin: 4.2 g/dL (ref 3.8–4.8)
Alkaline Phosphatase: 162 IU/L — ABNORMAL HIGH (ref 44–121)
BUN/Creatinine Ratio: 16 (ref 9–23)
BUN: 10 mg/dL (ref 6–24)
Bilirubin Total: 0.4 mg/dL (ref 0.0–1.2)
CO2: 24 mmol/L (ref 20–29)
Calcium: 9.3 mg/dL (ref 8.7–10.2)
Chloride: 103 mmol/L (ref 96–106)
Creatinine, Ser: 0.62 mg/dL (ref 0.57–1.00)
Globulin, Total: 2.6 g/dL (ref 1.5–4.5)
Glucose: 123 mg/dL — ABNORMAL HIGH (ref 65–99)
Potassium: 4.3 mmol/L (ref 3.5–5.2)
Sodium: 141 mmol/L (ref 134–144)
Total Protein: 6.8 g/dL (ref 6.0–8.5)
eGFR: 110 mL/min/{1.73_m2} (ref >59)

## 2021-05-23 LAB — CBC WITH DIFFERENTIAL/PLATELET
Basophils Absolute: 0 10*3/uL (ref 0.0–0.2)
Basos: 0 %
EOS (ABSOLUTE): 0.1 10*3/uL (ref 0.0–0.4)
Eos: 1 %
Hematocrit: 38.1 % (ref 34.0–46.6)
Hemoglobin: 11.6 g/dL (ref 11.1–15.9)
Immature Grans (Abs): 0.1 10*3/uL (ref 0.0–0.1)
Immature Granulocytes: 1 %
Lymphocytes Absolute: 4.2 10*3/uL — ABNORMAL HIGH (ref 0.7–3.1)
Lymphs: 32 %
MCH: 25.8 pg — ABNORMAL LOW (ref 26.6–33.0)
MCHC: 30.4 g/dL — ABNORMAL LOW (ref 31.5–35.7)
MCV: 85 fL (ref 79–97)
Monocytes Absolute: 1.1 10*3/uL — ABNORMAL HIGH (ref 0.1–0.9)
Monocytes: 8 %
Neutrophils Absolute: 7.7 10*3/uL — ABNORMAL HIGH (ref 1.4–7.0)
Neutrophils: 58 %
Platelets: 456 10*3/uL — ABNORMAL HIGH (ref 150–450)
RBC: 4.5 x10E6/uL (ref 3.77–5.28)
RDW: 14.5 % (ref 11.7–15.4)
WBC: 13.2 10*3/uL — ABNORMAL HIGH (ref 3.4–10.8)

## 2021-05-23 LAB — HEMOGLOBIN A1C
Est. average glucose Bld gHb Est-mCnc: 134 mg/dL
Hgb A1c MFr Bld: 6.3 % — ABNORMAL HIGH (ref 4.8–5.6)

## 2021-05-23 LAB — LIPID PANEL
Chol/HDL Ratio: 4.3 ratio (ref 0.0–4.4)
Cholesterol, Total: 256 mg/dL — ABNORMAL HIGH (ref 100–199)
HDL: 59 mg/dL (ref >39)
LDL Chol Calc (NIH): 183 mg/dL — ABNORMAL HIGH (ref 0–99)
Triglycerides: 85 mg/dL (ref 0–149)
VLDL Cholesterol Cal: 14 mg/dL (ref 5–40)

## 2021-05-23 LAB — TSH: TSH: 3.4 u[IU]/mL (ref 0.450–4.500)

## 2021-05-26 DIAGNOSIS — F3130 Bipolar disorder, current episode depressed, mild or moderate severity, unspecified: Secondary | ICD-10-CM | POA: Diagnosis not present

## 2021-05-28 ENCOUNTER — Other Ambulatory Visit: Payer: Self-pay

## 2021-05-28 ENCOUNTER — Ambulatory Visit (INDEPENDENT_AMBULATORY_CARE_PROVIDER_SITE_OTHER): Payer: Medicare Other

## 2021-05-28 VITALS — Ht 65.0 in | Wt >= 6400 oz

## 2021-05-28 DIAGNOSIS — E1169 Type 2 diabetes mellitus with other specified complication: Secondary | ICD-10-CM

## 2021-05-28 DIAGNOSIS — I10 Essential (primary) hypertension: Secondary | ICD-10-CM | POA: Diagnosis not present

## 2021-05-28 DIAGNOSIS — E782 Mixed hyperlipidemia: Secondary | ICD-10-CM

## 2021-05-28 DIAGNOSIS — Z0000 Encounter for general adult medical examination without abnormal findings: Secondary | ICD-10-CM | POA: Diagnosis not present

## 2021-05-28 NOTE — Progress Notes (Signed)
Subjective:   Jasmin Castro is a 45 y.o. female who presents for Medicare Annual (Subsequent) preventive examination. Virtual Visit via Telephone Note  I connected with  Jasmin Castro on 05/28/21 at  9:00 AM EDT by telephone and verified that I am speaking with the correct person using two identifiers.  Location: Patient: HOME Provider: [facility] Persons participating in the virtual visit: patient/Nurse Health Advisor   I discussed the limitations, risks, security and privacy concerns of performing an evaluation and management service by telephone and the availability of in person appointments. The patient expressed understanding and agreed to proceed.  Interactive audio and video telecommunications were attempted between this nurse and patient, however failed, due to patient having technical difficulties OR patient did not have access to video capability.  We continued and completed visit with audio only.  Some vital signs may be absent or patient reported.   Chriss Driver, LPN  Review of Systems     Cardiac Risk Factors include: diabetes mellitus;dyslipidemia;hypertension;obesity (BMI >30kg/m2);sedentary lifestyle     Objective:    Today's Vitals   05/28/21 0903 05/28/21 0905  Weight: (!) 456 lb (206.8 kg)   Height: 5' 5"  (1.651 m)   PainSc:  7    Body mass index is 75.88 kg/m.  Advanced Directives 05/28/2021 09/16/2018 12/21/2014  Does Patient Have a Medical Advance Directive? No No No  Would patient like information on creating a medical advance directive? No - Patient declined No - Patient declined No - patient declined information    Current Medications (verified) Outpatient Encounter Medications as of 05/28/2021  Medication Sig   acetaminophen (TYLENOL) 325 MG tablet Take 650 mg by mouth every 6 (six) hours as needed.   albuterol (VENTOLIN HFA) 108 (90 Base) MCG/ACT inhaler INHALE TWO PUFFS INTO THE LUNGS EVERY 6 HOURS AS NEEDED FOR FOR WHEEZING AND SHORTNESS OF  BREATH.   Cetirizine HCl (ZYRTEC ALLERGY) 10 MG TBDP Take 10 mg by mouth at bedtime.   clotrimazole (CLOTRIMAZOLE ANTI-FUNGAL) 1 % cream Apply 1 application topically 2 (two) times daily. To foot   Dulaglutide (TRULICITY) 3 EG/3.1DV SOPN Inject 3 mg as directed once a week.   enalapril (VASOTEC) 20 MG tablet Take 1 tablet (20 mg total) by mouth daily.   fluticasone (FLONASE) 50 MCG/ACT nasal spray USE 2 SPRAYS IN EACH NOSTRIL ONCE DAILY. SHAKE GENTLY BEFORE USING.   IBU 600 MG tablet Take 600 mg by mouth 2 (two) times daily as needed.   LORazepam (ATIVAN) 1 MG tablet Take 1 mg by mouth at bedtime.   meclizine (ANTIVERT) 12.5 MG tablet TAKE ONE TABLET BY MOUTH THREE TIMES DAILY AS NEEDED FOR DIZZINESS OR NAUSEA   mupirocin ointment (BACTROBAN) 2 % Apply topically 2 (two) times daily. To naval   nystatin cream (MYCOSTATIN) Apply 1 application topically 2 (two) times daily.   OLANZapine (ZYPREXA) 15 MG tablet Take 15 mg by mouth at bedtime.   Omega-3 Fatty Acids (FISH OIL) 1000 MG CAPS Take 1 capsule by mouth daily.   PRISTIQ 100 MG 24 hr tablet Take 100 mg by mouth at bedtime.   benzonatate (TESSALON) 100 MG capsule Take 1 capsule (100 mg total) by mouth 2 (two) times daily as needed for cough. (Patient not taking: Reported on 05/28/2021)   rosuvastatin (CRESTOR) 10 MG tablet Take 1 tablet (10 mg total) by mouth 3 (three) times a week. (Patient not taking: Reported on 05/28/2021)   No facility-administered encounter medications on file as of  05/28/2021.    Allergies (verified) Keflex [cephalexin], Lodine [etodolac], and Seroquel [quetiapine fumarate]   History: Past Medical History:  Diagnosis Date   Allergy    Anemia    Arthritis    Asthma    Depression    Diabetes mellitus without complication ([place]) 6144   GERD (gastroesophageal reflux disease)    Hyperlipemia    Hypertension    Obesity    Panic attacks    Paranoid ([place])    PCOS (polycystic ovarian syndrome)    Prediabetes     Sleep apnea    Urge incontinence 07/19/2013   Past Surgical History:  Procedure Laterality Date   HYSTEROSCOPY DIAGNOSTIC     insulin resistant     Family History  Problem Relation Age of Onset   Hyperlipidemia Mother    Arthritis Mother    Diabetes Mother    Heart disease Mother    Stroke Mother    Hyperlipidemia Father    Cancer Father        colon and prostate , brain tumor   Diabetes Sister        prediatic   Arthritis Sister    Hyperlipidemia Sister    Obesity Sister    Hyperlipidemia Brother    Hypertension Brother    Anxiety disorder Brother    Arthritis Brother    Diabetes Brother    Obesity Brother    Diabetes Sister    Social History   Socioeconomic History   Marital status: Single    Spouse name: Not on file   Number of children: Not on file   Years of education: Not on file   Highest education level: Not on file  Occupational History   Not on file  Tobacco Use   Smoking status: Never   Smokeless tobacco: Never   Tobacco comments:    Never smoked  Substance and Sexual Activity   Alcohol use: No   Drug use: No   Sexual activity: Never    Birth control/protection: None  Other Topics Concern   Not on file  Social History Narrative   Not on file   Social Determinants of Health   Financial Resource Strain: Low Risk    Difficulty of Paying Living Expenses: Not hard at all  Food Insecurity: No Food Insecurity   Worried About Charity fundraiser in the Last Year: Never true   [place] in the Last Year: Never true  Transportation Needs: No Transportation Needs   Lack of Transportation (Medical): No   Lack of Transportation (Non-Medical): No  Physical Activity: Inactive   Days of Exercise per Week: 0 days   Minutes of Exercise per Session: 0 min  Stress: No Stress Concern Present   Feeling of Stress : Not at all  Social Connections: Socially Isolated   Frequency of Communication with Friends and Family: More than three times a week    Frequency of Social Gatherings with Friends and Family: More than three times a week   Attends Religious Services: Never   Marine scientist or Organizations: No   Attends Music therapist: Never   Marital Status: Never married    Tobacco Counseling Counseling given: Not Answered Tobacco comments: Never smoked   Clinical Intake:  Pre-visit preparation completed: Yes  Pain : 0-10 Pain Score: 7  Pain Type: Chronic pain Pain Location: Knee Pain Descriptors / Indicators: Aching Pain Onset: More than a month ago Pain Frequency: Intermittent     BMI -  recorded: 75.88 Nutritional Status: BMI > 30  Obese Nutritional Risks: None Diabetes: Yes  How often do you need to have someone help you when you read instructions, pamphlets, or other written materials from your doctor or pharmacy?: 1 - Never  Diabetic?Nutrition Risk Assessment:  Has the patient had any N/V/D within the last 2 months?  No  Does the patient have any non-healing wounds?  No  Has the patient had any unintentional weight loss or weight gain?  No   Diabetes:  Is the patient diabetic?  Yes  If diabetic, was a CBG obtained today?  No  Did the patient bring in their glucometer from home?  No  How often do you monitor your CBG's? Daily per pt.   Financial Strains and Diabetes Management:  Are you having any financial strains with the device, your supplies or your medication? No .  Does the patient want to be seen by Chronic Care Management for management of their diabetes?  Yes  Would the patient like to be referred to a Nutritionist or for Diabetic Management?  Yes   Diabetic Exams:  Diabetic Eye Exam: Completed 4 years ago. Overdue for diabetic eye exam. Pt has been advised about the importance in completing this exam. A referral has been placed today. Message sent to referral coordinator for scheduling purposes. Advised pt to expect a call from office referred to regarding appt.  Diabetic  Foot Exam: Completed 12/26/20. Pt has been advised about the importance in completing this exam. Pt is scheduled for diabetic foot exam on 2023.    Interpreter Needed?: No  Information entered by :: Carman Ching, LPN   Activities of Daily Living In your present state of health, do you have any difficulty performing the following activities: 05/28/2021  Hearing? N  Vision? N  Difficulty concentrating or making decisions? N  Walking or climbing stairs? Y  Dressing or bathing? N  Doing errands, shopping? N  Preparing Food and eating ? N  Using the Toilet? N  In the past six months, have you accidently leaked urine? N  Do you have problems with loss of bowel control? N  Managing your Medications? N  Managing your Finances? N  Housekeeping or managing your Housekeeping? N  Some recent data might be hidden    Patient Care Team: Lindell Spar, MD as PCP - General (Internal Medicine)  Indicate any recent Medical Services you may have received from other than [facility] providers in the past year (date may be approximate).     Assessment:   This is a routine wellness examination for Euline.  Hearing/Vision screen Hearing Screening - Comments:: No hearing issues.  Vision Screening - Comments:: Glasses. Overdue on eye exam. Dr Radford Pax  Dietary issues and exercise activities discussed: Current Exercise Habits: The patient does not participate in regular exercise at present, Exercise limited by: cardiac condition(s);orthopedic condition(s);respiratory conditions(s)   Goals Addressed             This Visit's Progress    Have 3 meals a day       Work on diet to bring down A1C. Referral to Nutritionist.       Depression Screen PHQ 2/9 Scores 05/28/2021 04/29/2021 03/04/2021 12/26/2020 03/29/2020 09/16/2018 12/08/2017  PHQ - 2 Score 0 0 0 0 0 0 0  PHQ- 9 Score 0 0 - - 0 - 0    Fall Risk Fall Risk  05/28/2021 04/29/2021 03/04/2021 12/26/2020 03/29/2020  Falls in the past year? 0 0  0 0 0  Number  falls in past yr: 0 0 0 0 -  Injury with Fall? 1 0 0 0 -  Risk for fall due to : Impaired mobility;Orthopedic patient No Fall Risks No Fall Risks No Fall Risks No Fall Risks  Follow up Falls prevention discussed Falls evaluation completed Falls evaluation completed Falls evaluation completed Falls evaluation completed    [place]:  Any stairs in or around the home? No  If so, are there any without handrails? No  Home free of loose throw rugs in walkways, pet beds, electrical cords, etc? Yes  Adequate lighting in your home to reduce risk of falls? Yes   ASSISTIVE DEVICES UTILIZED TO PREVENT FALLS:  Life alert? No  Use of a cane, walker or w/c? Yes  Grab bars in the bathroom? Yes  Shower chair or bench in shower? Yes  Elevated toilet seat or a handicapped toilet? Yes   TIMED UP AND GO:  Was the test performed? No Phone visit.    Cognitive Function:     6CIT Screen 05/28/2021  What Year? 0 points  What month? 0 points  What time? 0 points  Count back from 20 0 points  Months in reverse 0 points  Repeat phrase 2 points  Total Score 2    Immunizations Immunization History  Administered Date(s) Administered   Influenza Split 07/18/2012   Influenza,inj,Quad PF,6+ Mos 10/13/2013, 09/12/2014, 05/21/2015    TDAP status: Due, Education has been provided regarding the importance of this vaccine. Advised may receive this vaccine at local pharmacy or Health Dept. Aware to provide a copy of the vaccination record if obtained from local pharmacy or Health Dept. Verbalized acceptance and understanding.  Flu Vaccine status: Declined, Education has been provided regarding the importance of this vaccine but patient still declined. Advised may receive this vaccine at local pharmacy or Health Dept. Aware to provide a copy of the vaccination record if obtained from local pharmacy or Health Dept. Verbalized acceptance and understanding.  Pneumococcal  vaccine status: Declined,  Education has been provided regarding the importance of this vaccine but patient still declined. Advised may receive this vaccine at local pharmacy or Health Dept. Aware to provide a copy of the vaccination record if obtained from local pharmacy or Health Dept. Verbalized acceptance and understanding.   Covid-19 vaccine status: Declined, Education has been provided regarding the importance of this vaccine but patient still declined. Advised may receive this vaccine at local pharmacy or Health Dept.or vaccine clinic. Aware to provide a copy of the vaccination record if obtained from local pharmacy or Health Dept. Verbalized acceptance and understanding.  Qualifies for Shingles Vaccine? No   Zostavax completed  N/A due to age   Shingrix Completed?: No.    Education has been provided regarding the importance of this vaccine. Patient has been advised to call insurance company to determine out of pocket expense if they have not yet received this vaccine. Advised may also receive vaccine at local pharmacy or Health Dept. Verbalized acceptance and understanding.  Screening Tests Health Maintenance  Topic Date Due   COVID-19 Vaccine (1) Never done   PNEUMOCOCCAL POLYSACCHARIDE VACCINE AGE 5-64 HIGH RISK  Never done   OPHTHALMOLOGY EXAM  Never done   TETANUS/TDAP  Never done   PAP SMEAR-Modifier  07/19/2016   COLONOSCOPY (Pts 45-15yr Insurance coverage will need to be confirmed)  Never done   INFLUENZA VACCINE  04/14/2021   HEMOGLOBIN A1C  11/19/2021  FOOT EXAM  12/26/2021   Hepatitis C Screening  Completed   HIV Screening  Completed   Pneumococcal Vaccine 1-32 Years old  Aged Out   HPV [place] Maintenance Due  Topic Date Due   COVID-19 Vaccine (1) Never done   PNEUMOCOCCAL POLYSACCHARIDE VACCINE AGE 33-64 HIGH RISK  Never done   OPHTHALMOLOGY EXAM  Never done   TETANUS/TDAP  Never done   PAP SMEAR-Modifier  07/19/2016    COLONOSCOPY (Pts 45-78yr Insurance coverage will need to be confirmed)  Never done   INFLUENZA VACCINE  04/14/2021    Colorectal cancer screening: No longer required. Not required due to age  Mammogram status: Ordered Offered to order Mammogram. Pt declines at this time. .Marland KitchenPt provided with contact info and advised to call to schedule appt.   Bone Density status: Ordered Not due at this time due to pt's age. .Marland KitchenPt provided with contact info and advised to call to schedule appt.  Lung Cancer Screening: (Low Dose CT Chest recommended if Age 47-80years, 30 pack-year currently smoking OR have quit w/in 15years.) does not qualify.    Additional Screening:  Hepatitis C Screening: does not qualify; Completed 08/28/20  Vision Screening: Recommended annual ophthalmology exams for early detection of glaucoma and other disorders of the eye. Is the patient up to date with their annual eye exam?  No  Who is the provider or what is the name of the office in which the patient attends annual eye exams? Pt states she used to see Dr. TRadford Pax Declines referral If pt is not established with a provider, would they like to be referred to a provider to establish care? No .   Dental Screening: Recommended annual dental exams for proper oral hygiene  Community Resource Referral / Chronic Care Management: CRR required this visit?  No   CCM required this visit?  Yes      Plan:     I have personally reviewed and noted the following in the patient's chart:   Medical and social history Use of alcohol, tobacco or illicit drugs  Current medications and supplements including opioid prescriptions.  Functional ability and status Nutritional status Physical activity Advanced directives List of other physicians Hospitalizations, surgeries, and ER visits in previous 12 months Vitals Screenings to include cognitive, depression, and falls Referrals and appointments  In addition, I have reviewed and  discussed with patient certain preventive protocols, quality metrics, and best practice recommendations. A written personalized care plan for preventive services as well as general preventive health recommendations were provided to patient.     MChriss Driver LPN   99/53/2023  Nurse Notes: Pt declines referral for Mammogram and Eye Exam. Pt states she will call to schedule. Pt would like referral to CCM for diabetes management and nutritional counseling.

## 2021-05-28 NOTE — Patient Instructions (Signed)
Jasmin Castro , Thank you for taking time to come for your Medicare Wellness Visit. I appreciate your ongoing commitment to your health goals. Please review the following plan we discussed and let me know if I can assist you in the future.   Screening recommendations/referrals: Colonoscopy: Due at age 47 Mammogram: Due, Repeat annually  Bone Density: Due at age 41 Recommended yearly ophthalmology/optometry visit for glaucoma screening and checkup Recommended yearly dental visit for hygiene and checkup  Vaccinations: Influenza vaccine: Declines Pneumococcal vaccine: Due at age 23 Tdap vaccine: Due now. Repeat in 10 years  Shingles vaccine: Due at age 31.  Covid-19: Declines  Advanced directives: Advance directive discussed with you today. Even though you declined this today, please call our office should you change your mind, and we can give you the proper paperwork for you to fill out.   Conditions/risks identified: Aim for 30 minutes of exercise  each day, drink 6-8 glasses of water and eat lots of fruits and vegetables.   Next appointment: Follow up in one year for your annual wellness visit.   Preventive Care 40-64 Years, Female Preventive care refers to lifestyle choices and visits with your health care provider that can promote health and wellness. What does preventive care include? A yearly physical exam. This is also called an annual well check. Dental exams once or twice a year. Routine eye exams. Ask your health care provider how often you should have your eyes checked. Personal lifestyle choices, including: Daily care of your teeth and gums. Regular physical activity. Eating a healthy diet. Avoiding tobacco and drug use. Limiting alcohol use. Practicing safe sex. Taking low-dose aspirin daily starting at age 51. Taking vitamin and mineral supplements as recommended by your health care provider. What happens during an annual well check? The services and screenings  done by your health care provider during your annual well check will depend on your age, overall health, lifestyle risk factors, and family history of disease. Counseling  Your health care provider may ask you questions about your: Alcohol use. Tobacco use. Drug use. Emotional well-being. Home and relationship well-being. Sexual activity. Eating habits. Work and work Statistician. Method of birth control. Menstrual cycle. Pregnancy history. Screening  You may have the following tests or measurements: Height, weight, and BMI. Blood pressure. Lipid and cholesterol levels. These may be checked every 5 years, or more frequently if you are over 82 years old. Skin check. Lung cancer screening. You may have this screening every year starting at age 39 if you have a 30-pack-year history of smoking and currently smoke or have quit within the past 15 years. Fecal occult blood test (FOBT) of the stool. You may have this test every year starting at age 10. Flexible sigmoidoscopy or colonoscopy. You may have a sigmoidoscopy every 5 years or a colonoscopy every 10 years starting at age 75. Hepatitis C blood test. Hepatitis B blood test. Sexually transmitted disease (STD) testing. Diabetes screening. This is done by checking your blood sugar (glucose) after you have not eaten for a while (fasting). You may have this done every 1-3 years. Mammogram. This may be done every 1-2 years. Talk to your health care provider about when you should start having regular mammograms. This may depend on whether you have a family history of breast cancer. BRCA-related cancer screening. This may be done if you have a family history of breast, ovarian, tubal, or peritoneal cancers. Pelvic exam and Pap test. This may be done every 3 years starting  at age 1. Starting at age 44, this may be done every 5 years if you have a Pap test in combination with an HPV test. Bone density scan. This is done to screen for osteoporosis.  You may have this scan if you are at high risk for osteoporosis. Discuss your test results, treatment options, and if necessary, the need for more tests with your health care provider. Vaccines  Your health care provider may recommend certain vaccines, such as: Influenza vaccine. This is recommended every year. Tetanus, diphtheria, and acellular pertussis (Tdap, Td) vaccine. You may need a Td booster every 10 years. Zoster vaccine. You may need this after age 38. Pneumococcal 13-valent conjugate (PCV13) vaccine. You may need this if you have certain conditions and were not previously vaccinated. Pneumococcal polysaccharide (PPSV23) vaccine. You may need one or two doses if you smoke cigarettes or if you have certain conditions. Talk to your health care provider about which screenings and vaccines you need and how often you need them. This information is not intended to replace advice given to you by your health care provider. Make sure you discuss any questions you have with your health care provider. Document Released: 09/27/2015 Document Revised: 05/20/2016 Document Reviewed: 07/02/2015 Elsevier Interactive Patient Education  2017 [employer] Prevention in the Home Falls can cause injuries. They can happen to people of all ages. There are many things you can do to make your home safe and to help prevent falls. What can I do on the outside of my home? Regularly fix the edges of walkways and driveways and fix any cracks. Remove anything that might make you trip as you walk through a door, such as a raised step or threshold. Trim any bushes or trees on the path to your home. Use bright outdoor lighting. Clear any walking paths of anything that might make someone trip, such as rocks or tools. Regularly check to see if handrails are loose or broken. Make sure that both sides of any steps have handrails. Any raised decks and porches should have guardrails on the edges. Have any  leaves, snow, or ice cleared regularly. Use sand or salt on walking paths during winter. Clean up any spills in your garage right away. This includes oil or grease spills. What can I do in the bathroom? Use night lights. Install grab bars by the toilet and in the tub and shower. Do not use towel bars as grab bars. Use non-skid mats or decals in the tub or shower. If you need to sit down in the shower, use a plastic, non-slip stool. Keep the floor dry. Clean up any water that spills on the floor as soon as it happens. Remove soap buildup in the tub or shower regularly. Attach bath mats securely with double-sided non-slip rug tape. Do not have throw rugs and other things on the floor that can make you trip. What can I do in the bedroom? Use night lights. Make sure that you have a light by your bed that is easy to reach. Do not use any sheets or blankets that are too big for your bed. They should not hang down onto the floor. Have a firm chair that has side arms. You can use this for support while you get dressed. Do not have throw rugs and other things on the floor that can make you trip. What can I do in the kitchen? Clean up any spills right away. Avoid walking on wet floors. Keep items  that you use a lot in easy-to-reach places. If you need to reach something above you, use a strong step stool that has a grab bar. Keep electrical cords out of the way. Do not use floor polish or wax that makes floors slippery. If you must use wax, use non-skid floor wax. Do not have throw rugs and other things on the floor that can make you trip. What can I do with my stairs? Do not leave any items on the stairs. Make sure that there are handrails on both sides of the stairs and use them. Fix handrails that are broken or loose. Make sure that handrails are as long as the stairways. Check any carpeting to make sure that it is firmly attached to the stairs. Fix any carpet that is loose or worn. Avoid  having throw rugs at the top or bottom of the stairs. If you do have throw rugs, attach them to the floor with carpet tape. Make sure that you have a light switch at the top of the stairs and the bottom of the stairs. If you do not have them, ask someone to add them for you. What else can I do to help prevent falls? Wear shoes that: Do not have high heels. Have rubber bottoms. Are comfortable and fit you well. Are closed at the toe. Do not wear sandals. If you use a stepladder: Make sure that it is fully opened. Do not climb a closed stepladder. Make sure that both sides of the stepladder are locked into place. Ask someone to hold it for you, if possible. Clearly mark and make sure that you can see: Any grab bars or handrails. First and last steps. Where the edge of each step is. Use tools that help you move around (mobility aids) if they are needed. These include: Canes. Walkers. Scooters. Crutches. Turn on the lights when you go into a dark area. Replace any light bulbs as soon as they burn out. Set up your furniture so you have a clear path. Avoid moving your furniture around. If any of your floors are uneven, fix them. If there are any pets around you, be aware of where they are. Review your medicines with your doctor. Some medicines can make you feel dizzy. This can increase your chance of falling. Ask your doctor what other things that you can do to help prevent falls. This information is not intended to replace advice given to you by your health care provider. Make sure you discuss any questions you have with your health care provider. Document Released: 06/27/2009 Document Revised: 02/06/2016 Document Reviewed: 10/05/2014 Elsevier Interactive Patient Education  2017 [employer].

## 2021-05-29 ENCOUNTER — Other Ambulatory Visit: Payer: Self-pay | Admitting: *Deleted

## 2021-05-29 ENCOUNTER — Telehealth: Payer: Self-pay | Admitting: *Deleted

## 2021-05-29 DIAGNOSIS — E1169 Type 2 diabetes mellitus with other specified complication: Secondary | ICD-10-CM

## 2021-05-29 DIAGNOSIS — E8881 Metabolic syndrome: Secondary | ICD-10-CM

## 2021-05-29 NOTE — Chronic Care Management (AMB) (Signed)
  Chronic Care Management   Note  05/29/2021 Name: Jasmin Castro MRN: [id number] DOB: 01-21-1976  [name] is a 45 y.o. year old female who is a primary care patient of Lindell Spar, MD. I reached out to [name] by phone today in response to a referral sent by Ms. [place] PCP, Dr. Posey Pronto      Ms. Leisey was given information about Chronic Care Management services today including:  CCM service includes personalized support from designated clinical staff supervised by her physician, including individualized plan of care and coordination with other care providers 24/7 contact phone numbers for assistance for urgent and routine care needs. Service will only be billed when office clinical staff spend 20 minutes or more in a month to coordinate care. Only one practitioner may furnish and bill the service in a calendar month. The patient may stop CCM services at any time (effective at the end of the month) by phone call to the office staff. The patient will be responsible for cost sharing (co-pay) of up to 20% of the service fee (after annual deductible is met).  Patient did not agree to enrollment in care management services and does not wish to consider at this time.  Follow up plan: Patient declines engagement by the care management team. Appropriate care team members and provider have been notified via electronic communication. The care management team is available to follow up with the patient after provider conversation with the patient regarding recommendation for care management engagement and subsequent re-referral to the care management team.   [facility]  Direct Dial: [phone]

## 2021-05-29 NOTE — Telephone Encounter (Signed)
Referral provided

## 2021-06-05 ENCOUNTER — Other Ambulatory Visit: Payer: Self-pay | Admitting: *Deleted

## 2021-06-05 MED ORDER — TRULICITY 3 MG/0.5ML ~~LOC~~ SOAJ
3.0000 mg | SUBCUTANEOUS | 3 refills | Status: DC
Start: 2021-06-05 — End: 2021-09-17

## 2021-06-23 ENCOUNTER — Other Ambulatory Visit: Payer: Self-pay | Admitting: *Deleted

## 2021-06-23 MED ORDER — MUPIROCIN 2 % EX OINT: TOPICAL_OINTMENT | Freq: Two times a day (BID) | CUTANEOUS | 0 refills | Status: DC

## 2021-07-01 ENCOUNTER — Other Ambulatory Visit: Payer: Self-pay | Admitting: *Deleted

## 2021-07-01 DIAGNOSIS — E119 Type 2 diabetes mellitus without complications: Secondary | ICD-10-CM

## 2021-07-01 DIAGNOSIS — Z0100 Encounter for examination of eyes and vision without abnormal findings: Secondary | ICD-10-CM

## 2021-07-21 ENCOUNTER — Ambulatory Visit: Payer: Medicare Other | Admitting: Registered"

## 2021-07-21 ENCOUNTER — Telehealth: Payer: Medicare Other | Admitting: Registered"

## 2021-07-25 ENCOUNTER — Other Ambulatory Visit: Payer: Self-pay | Admitting: *Deleted

## 2021-07-25 MED ORDER — ENALAPRIL MALEATE 20 MG PO TABS: 20.0000 mg | ORAL_TABLET | Freq: Every day | ORAL | 5 refills | Status: DC

## 2021-08-21 ENCOUNTER — Encounter: Payer: Self-pay | Admitting: Internal Medicine

## 2021-08-21 ENCOUNTER — Ambulatory Visit (INDEPENDENT_AMBULATORY_CARE_PROVIDER_SITE_OTHER): Payer: Medicare Other | Admitting: Internal Medicine

## 2021-08-21 ENCOUNTER — Other Ambulatory Visit: Payer: Self-pay

## 2021-08-21 DIAGNOSIS — J0100 Acute maxillary sinusitis, unspecified: Secondary | ICD-10-CM | POA: Diagnosis not present

## 2021-08-21 MED ORDER — BENZONATATE 100 MG PO CAPS: 100.0000 mg | ORAL_CAPSULE | Freq: Two times a day (BID) | ORAL | 0 refills | Status: DC | PRN

## 2021-08-21 MED ORDER — AZITHROMYCIN 250 MG PO TABS
ORAL_TABLET | ORAL | 0 refills | Status: AC
Start: 2021-08-21 — End: 2021-08-26

## 2021-08-21 NOTE — Progress Notes (Signed)
Virtual Visit via Telephone Note   This visit type was conducted due to national recommendations for restrictions regarding the COVID-19 Pandemic (e.g. social distancing) in an effort to limit this patient's exposure and mitigate transmission in our community.  Due to her co-morbid illnesses, this patient is at least at moderate risk for complications without adequate follow up.  This format is felt to be most appropriate for this patient at this time.  The patient did not have access to video technology/had technical difficulties with video requiring transitioning to audio format only (telephone).  All issues noted in this document were discussed and addressed.  No physical exam could be performed with this format.  Evaluation Performed:  Follow-up visit  Date:  08/21/2021   ID:  Jasmin, Castro October 23, 1975, MRN [id number]  Patient Location: Home Provider Location: Office/Clinic  Participants: Patient Location of Patient: Home Location of Provider: Telehealth Consent was obtain for visit to be over via telehealth. I verified that I am speaking with the correct person using two identifiers.  PCP:  Lindell Spar, MD   Chief Complaint: Cough, sinus drainage and loose BM  History of Present Illness:    Jasmin Castro is a 45 y.o. female who has a televisit for complaint of cough, sinus pressure related headache, postnasal drip and nasal congestion for about 2 weeks now.  She denies any fever or chills currently.  Denies any dyspnea or wheezing currently.  She has started having loose BM and abdominal cramps for about 1 week now.  Denies any melena or hematochezia currently.  The patient does not have symptoms concerning for COVID-19 infection (fever, chills, cough, or new shortness of breath).   Past Medical, Surgical, Social History, Allergies, and Medications have been Reviewed.  Past Medical History:  Diagnosis Date   Allergy    Anemia    Arthritis    Asthma     Depression    Diabetes mellitus without complication ([place]) 1751   GERD (gastroesophageal reflux disease)    Hyperlipemia    Hypertension    Obesity    Panic attacks    Paranoid ([place])    PCOS (polycystic ovarian syndrome)    Prediabetes    Sleep apnea    Urge incontinence 07/19/2013   Past Surgical History:  Procedure Laterality Date   HYSTEROSCOPY DIAGNOSTIC     insulin resistant       Current Meds  Medication Sig   acetaminophen (TYLENOL) 325 MG tablet Take 650 mg by mouth every 6 (six) hours as needed.   albuterol (VENTOLIN HFA) 108 (90 Base) MCG/ACT inhaler INHALE TWO PUFFS INTO THE LUNGS EVERY 6 HOURS AS NEEDED FOR FOR WHEEZING AND SHORTNESS OF BREATH.   benzonatate (TESSALON) 100 MG capsule Take 1 capsule (100 mg total) by mouth 2 (two) times daily as needed for cough.   Cetirizine HCl (ZYRTEC ALLERGY) 10 MG TBDP Take 10 mg by mouth at bedtime.   clotrimazole (CLOTRIMAZOLE ANTI-FUNGAL) 1 % cream Apply 1 application topically 2 (two) times daily. To foot   Dulaglutide (TRULICITY) 3 WC/5.8NI SOPN Inject 3 mg as directed once a week.   enalapril (VASOTEC) 20 MG tablet Take 1 tablet (20 mg total) by mouth daily.   fluticasone (FLONASE) 50 MCG/ACT nasal spray USE 2 SPRAYS IN EACH NOSTRIL ONCE DAILY. SHAKE GENTLY BEFORE USING.   IBU 600 MG tablet Take 600 mg by mouth 2 (two) times daily as needed.   LORazepam (ATIVAN) 1 MG tablet Take  1 mg by mouth at bedtime.   meclizine (ANTIVERT) 12.5 MG tablet TAKE ONE TABLET BY MOUTH THREE TIMES DAILY AS NEEDED FOR DIZZINESS OR NAUSEA   mupirocin ointment (BACTROBAN) 2 % Apply topically 2 (two) times daily. To naval   nystatin cream (MYCOSTATIN) Apply 1 application topically 2 (two) times daily.   OLANZapine (ZYPREXA) 15 MG tablet Take 15 mg by mouth at bedtime.   Omega-3 Fatty Acids (FISH OIL) 1000 MG CAPS Take 1 capsule by mouth daily.   PRISTIQ 100 MG 24 hr tablet Take 100 mg by mouth at bedtime.   rosuvastatin (CRESTOR) 10 MG tablet Take  1 tablet (10 mg total) by mouth 3 (three) times a week.     Allergies:   Crestor [rosuvastatin], Keflex [cephalexin], Lodine [etodolac], and Seroquel [quetiapine fumarate]   ROS:   Please see the history of present illness.     All other systems reviewed and are negative.   Labs/Other Tests and Data Reviewed:    Recent Labs: 05/22/2021: ALT 27; BUN 10; Creatinine, Ser 0.62; Hemoglobin 11.6; Platelets 456; Potassium 4.3; Sodium 141; TSH 3.400   Recent Lipid Panel Lab Results  Component Value Date/Time   CHOL 256 (H) 05/22/2021 08:35 AM   TRIG 85 05/22/2021 08:35 AM   HDL 59 05/22/2021 08:35 AM   CHOLHDL 4.3 05/22/2021 08:35 AM   CHOLHDL 5.1 (H) 08/28/2020 10:34 AM   LDLCALC 183 (H) 05/22/2021 08:35 AM   LDLCALC 184 (H) 08/28/2020 10:34 AM    Wt Readings from Last 3 Encounters:  05/28/21 (!) 456 lb (206.8 kg)  03/04/21 (!) 456 lb (206.8 kg)  12/26/20 (!) 464 lb 6.4 oz (210.7 kg)     ASSESSMENT & PLAN:    Acute sinusitis Started azithromycin since she has persistent symptoms despite trying symptomatic treatment Tessalon as needed for cough Continue Zyrtec for allergies Nasal saline spray as needed for nasal congestion Advised to maintain adequate hydration  Time:   Today, I have spent 9 minutes reviewing the chart, including problem list, medications, and with the patient with telehealth technology discussing the above problems.   Medication Adjustments/Labs and Tests Ordered: Current medicines are reviewed at length with the patient today.  Concerns regarding medicines are outlined above.   Tests Ordered: No orders of the defined types were placed in this encounter.   Medication Changes: No orders of the defined types were placed in this encounter.    Note: This dictation was prepared with Dragon dictation along with smaller phrase technology. Similar sounding words can be transcribed inadequately or may not be corrected upon review. Any transcriptional errors  that result from this process are unintentional.      Disposition:  Follow up  Signed, Lindell Spar, MD  08/21/2021 1:05 PM     [place]

## 2021-09-17 ENCOUNTER — Telehealth: Payer: Self-pay | Admitting: Internal Medicine

## 2021-09-17 ENCOUNTER — Other Ambulatory Visit: Payer: Self-pay | Admitting: Internal Medicine

## 2021-09-17 DIAGNOSIS — E1169 Type 2 diabetes mellitus with other specified complication: Secondary | ICD-10-CM

## 2021-09-17 MED ORDER — OZEMPIC (1 MG/DOSE) 4 MG/3ML ~~LOC~~ SOPN: 1.0000 mg | PEN_INJECTOR | SUBCUTANEOUS | 0 refills | Status: DC

## 2021-09-17 NOTE — Telephone Encounter (Signed)
Selena with [facility] in [place] call on pt behalf  States that   [place] (TRULICITY) 3 TR/7.1HA SOPN   Is currently on back order wanted to see what alternative provider wanted to use to ensure pt is getting the med

## 2021-09-22 ENCOUNTER — Telehealth: Payer: Self-pay

## 2021-09-22 NOTE — Telephone Encounter (Signed)
Patient must try the formulary alternatives first which are Bydureon and Trulicity before they will cover ozempic

## 2021-09-22 NOTE — Telephone Encounter (Signed)
Patient called said the Ozempic needs a prior authorization.  Pharmacy:   [facility]

## 2021-09-22 NOTE — Telephone Encounter (Signed)
Submitting PA on covermymeds

## 2021-09-23 ENCOUNTER — Other Ambulatory Visit: Payer: Self-pay | Admitting: Internal Medicine

## 2021-09-23 DIAGNOSIS — E1169 Type 2 diabetes mellitus with other specified complication: Secondary | ICD-10-CM

## 2021-09-23 MED ORDER — BYDUREON BCISE 2 MG/0.85ML ~~LOC~~ AUIJ: 2.0000 mg | AUTO-INJECTOR | SUBCUTANEOUS | 0 refills | Status: DC

## 2021-09-23 NOTE — Telephone Encounter (Signed)
They won't- I attempted PA and they said they only cover after failure of bydureon and trulicity.

## 2021-09-26 ENCOUNTER — Other Ambulatory Visit: Payer: Self-pay

## 2021-09-26 NOTE — Telephone Encounter (Signed)
They have trulicity now and she was able to get it

## 2021-10-05 DIAGNOSIS — M1990 Unspecified osteoarthritis, unspecified site: Secondary | ICD-10-CM | POA: Diagnosis not present

## 2021-10-05 DIAGNOSIS — M25552 Pain in left hip: Secondary | ICD-10-CM | POA: Diagnosis not present

## 2021-10-07 ENCOUNTER — Telehealth: Payer: Self-pay | Admitting: Internal Medicine

## 2021-10-07 DIAGNOSIS — M25552 Pain in left hip: Secondary | ICD-10-CM | POA: Diagnosis not present

## 2021-10-07 DIAGNOSIS — I10 Essential (primary) hypertension: Secondary | ICD-10-CM | POA: Diagnosis not present

## 2021-10-07 DIAGNOSIS — E8881 Metabolic syndrome: Secondary | ICD-10-CM | POA: Diagnosis not present

## 2021-10-07 DIAGNOSIS — E119 Type 2 diabetes mellitus without complications: Secondary | ICD-10-CM | POA: Diagnosis not present

## 2021-10-07 DIAGNOSIS — Z6841 Body Mass Index (BMI) 40.0 and over, adult: Secondary | ICD-10-CM | POA: Diagnosis not present

## 2021-10-07 DIAGNOSIS — M79606 Pain in leg, unspecified: Secondary | ICD-10-CM | POA: Diagnosis not present

## 2021-10-07 DIAGNOSIS — M160 Bilateral primary osteoarthritis of hip: Secondary | ICD-10-CM | POA: Diagnosis not present

## 2021-10-07 DIAGNOSIS — E785 Hyperlipidemia, unspecified: Secondary | ICD-10-CM | POA: Diagnosis not present

## 2021-10-07 DIAGNOSIS — M7062 Trochanteric bursitis, left hip: Secondary | ICD-10-CM | POA: Diagnosis not present

## 2021-10-07 DIAGNOSIS — R52 Pain, unspecified: Secondary | ICD-10-CM | POA: Diagnosis not present

## 2021-10-07 NOTE — Telephone Encounter (Signed)
Spoke with pt she called EMS and going to ER regarding hip pain that has lasted two weeks

## 2021-10-07 NOTE — Telephone Encounter (Signed)
Please cal the pt regarding Hip Pain

## 2021-10-14 ENCOUNTER — Telehealth (INDEPENDENT_AMBULATORY_CARE_PROVIDER_SITE_OTHER): Payer: Medicare Other | Admitting: Internal Medicine

## 2021-10-14 ENCOUNTER — Other Ambulatory Visit: Payer: Self-pay

## 2021-10-14 ENCOUNTER — Encounter: Payer: Self-pay | Admitting: Internal Medicine

## 2021-10-14 DIAGNOSIS — Z09 Encounter for follow-up examination after completed treatment for conditions other than malignant neoplasm: Secondary | ICD-10-CM

## 2021-10-14 DIAGNOSIS — Z1211 Encounter for screening for malignant neoplasm of colon: Secondary | ICD-10-CM

## 2021-10-14 DIAGNOSIS — Z2821 Immunization not carried out because of patient refusal: Secondary | ICD-10-CM | POA: Diagnosis not present

## 2021-10-14 DIAGNOSIS — M7062 Trochanteric bursitis, left hip: Secondary | ICD-10-CM

## 2021-10-14 MED ORDER — HYDROCODONE-ACETAMINOPHEN 5-325 MG PO TABS: 1.0000 | ORAL_TABLET | Freq: Four times a day (QID) | ORAL | 0 refills | Status: DC | PRN

## 2021-10-14 NOTE — Progress Notes (Signed)
Virtual Visit via Video Note   This visit type was conducted due to national recommendations for restrictions regarding the COVID-19 Pandemic (e.g. social distancing) in an effort to limit this patient's exposure and mitigate transmission in our community.  Due to her co-morbid illnesses, this patient is at least at moderate risk for complications without adequate follow up.  This format is felt to be most appropriate for this patient at this time.  All issues noted in this document were discussed and addressed.  A limited physical exam was performed with this format.     Evaluation Performed:  Follow-up visit  Date:  10/14/2021   ID:  Jasmin Castro, Jasmin Castro 1976/04/06, MRN [id number]  Patient Location: Home Provider Location: Office/Clinic  Participants: Patient Location of Patient: Home Location of Provider: Telehealth Consent was obtain for visit to be over via telehealth. I verified that I am speaking with the correct person using two identifiers.  PCP:  Lindell Spar, MD   Chief Complaint: Left hip pain  History of Present Illness:    Jasmin Castro is a 46 y.o. female who has a televisit for complaint of left hip pain, for which she recently went to [facility].  She was given oral prednisone and Norco for hip pain, which has improved her pain a little bit.  She also reports having mild tingling and numbness of the left foot.  Denies any recent injury or fall.  She had x-ray and CT of the hip done, which showed mild degenerative arthropathy, but no substantial spurring, fracture or avascular necrosis.  The patient does not have symptoms concerning for COVID-19 infection (fever, chills, cough, or new shortness of breath).   Past Medical, Surgical, Social History, Allergies, and Medications have been Reviewed.  Past Medical History:  Diagnosis Date   Allergy    Anemia    Arthritis    Asthma    Depression    Diabetes mellitus without complication ([place]) 3536   GERD  (gastroesophageal reflux disease)    Hyperlipemia    Hypertension    Obesity    Panic attacks    Paranoid ([place])    PCOS (polycystic ovarian syndrome)    Prediabetes    Sleep apnea    Urge incontinence 07/19/2013   Past Surgical History:  Procedure Laterality Date   HYSTEROSCOPY DIAGNOSTIC     insulin resistant       Current Meds  Medication Sig   acetaminophen (TYLENOL) 325 MG tablet Take 650 mg by mouth every 6 (six) hours as needed.   albuterol (VENTOLIN HFA) 108 (90 Base) MCG/ACT inhaler INHALE TWO PUFFS INTO THE LUNGS EVERY 6 HOURS AS NEEDED FOR FOR WHEEZING AND SHORTNESS OF BREATH.   benzonatate (TESSALON) 100 MG capsule Take 1 capsule (100 mg total) by mouth 2 (two) times daily as needed for cough.   Cetirizine HCl (ZYRTEC ALLERGY) 10 MG TBDP Take 10 mg by mouth at bedtime.   clotrimazole (CLOTRIMAZOLE ANTI-FUNGAL) 1 % cream Apply 1 application topically 2 (two) times daily. To foot   enalapril (VASOTEC) 20 MG tablet Take 1 tablet (20 mg total) by mouth daily.   fluticasone (FLONASE) 50 MCG/ACT nasal spray USE 2 SPRAYS IN EACH NOSTRIL ONCE DAILY. SHAKE GENTLY BEFORE USING.   IBU 600 MG tablet Take 600 mg by mouth 2 (two) times daily as needed.   LORazepam (ATIVAN) 1 MG tablet Take 1 mg by mouth at bedtime.   meclizine (ANTIVERT) 12.5 MG tablet TAKE  ONE TABLET BY MOUTH THREE TIMES DAILY AS NEEDED FOR DIZZINESS OR NAUSEA   mupirocin ointment (BACTROBAN) 2 % Apply topically 2 (two) times daily. To naval   nystatin cream (MYCOSTATIN) Apply 1 application topically 2 (two) times daily.   OLANZapine (ZYPREXA) 15 MG tablet Take 15 mg by mouth at bedtime.   Omega-3 Fatty Acids (FISH OIL) 1000 MG CAPS Take 1 capsule by mouth daily.   PRISTIQ 100 MG 24 hr tablet Take 100 mg by mouth at bedtime.   rosuvastatin (CRESTOR) 10 MG tablet Take 1 tablet (10 mg total) by mouth 3 (three) times a week.   [DISCONTINUED] HYDROcodone-acetaminophen (NORCO/VICODIN) 5-325 MG tablet Take 1 tablet by  mouth every 6 (six) hours as needed.     Allergies:   Crestor [rosuvastatin], Keflex [cephalexin], Lodine [etodolac], and Seroquel [quetiapine fumarate]   ROS:   Please see the history of present illness.     All other systems reviewed and are negative.   Labs/Other Tests and Data Reviewed:    Recent Labs: 05/22/2021: ALT 27; BUN 10; Creatinine, Ser 0.62; Hemoglobin 11.6; Platelets 456; Potassium 4.3; Sodium 141; TSH 3.400   Recent Lipid Panel Lab Results  Component Value Date/Time   CHOL 256 (H) 05/22/2021 08:35 AM   TRIG 85 05/22/2021 08:35 AM   HDL 59 05/22/2021 08:35 AM   CHOLHDL 4.3 05/22/2021 08:35 AM   CHOLHDL 5.1 (H) 08/28/2020 10:34 AM   LDLCALC 183 (H) 05/22/2021 08:35 AM   LDLCALC 184 (H) 08/28/2020 10:34 AM    Wt Readings from Last 3 Encounters:  05/28/21 (!) 456 lb (206.8 kg)  03/04/21 (!) 456 lb (206.8 kg)  12/26/20 (!) 464 lb 6.4 oz (210.7 kg)     ASSESSMENT & PLAN:    Hospital treatment follow-up Trochanteric bursitis of left hip ER chart reviewed including imaging Would avoid further steroid exposure due to her history of type II DM Referred to Orthopedic surgery Refilled Norco for now for acute pain Continue ibuprofen as needed for mild-to-moderate pain Avoid heavy lifting and frequent bending Heating pad as needed  Time:   Today, I have spent 23 minutes reviewing the chart, including problem list, medications, and with the patient with telehealth technology discussing the above problems.   Medication Adjustments/Labs and Tests Ordered: Current medicines are reviewed at length with the patient today.  Concerns regarding medicines are outlined above.   Tests Ordered: Orders Placed This Encounter  Procedures   Cologuard    Medication Changes: Meds ordered this encounter  Medications   HYDROcodone-acetaminophen (NORCO/VICODIN) 5-325 MG tablet    Sig: Take 1 tablet by mouth every 6 (six) hours as needed.    Dispense:  20 tablet    Refill:   0     Note: This dictation was prepared with Dragon dictation along with smaller phrase technology. Similar sounding words can be transcribed inadequately or may not be corrected upon review. Any transcriptional errors that result from this process are unintentional.      Disposition:  Follow up  Signed, Lindell Spar, MD  10/14/2021 2:26 PM     [facility]

## 2021-10-22 ENCOUNTER — Ambulatory Visit: Payer: Medicare Other | Admitting: Internal Medicine

## 2021-10-29 ENCOUNTER — Other Ambulatory Visit: Payer: Self-pay | Admitting: Internal Medicine

## 2021-11-05 ENCOUNTER — Other Ambulatory Visit: Payer: Self-pay

## 2021-11-05 ENCOUNTER — Ambulatory Visit: Payer: Medicare Other | Admitting: Orthopaedic Surgery

## 2021-11-05 DIAGNOSIS — Z1211 Encounter for screening for malignant neoplasm of colon: Secondary | ICD-10-CM | POA: Diagnosis not present

## 2021-11-12 LAB — COLOGUARD: COLOGUARD: NEGATIVE

## 2021-12-03 ENCOUNTER — Ambulatory Visit: Payer: Medicare Other | Admitting: Orthopaedic Surgery

## 2021-12-04 ENCOUNTER — Other Ambulatory Visit: Payer: Self-pay | Admitting: Internal Medicine

## 2021-12-05 ENCOUNTER — Other Ambulatory Visit: Payer: Self-pay | Admitting: *Deleted

## 2021-12-05 MED ORDER — CLOTRIMAZOLE 1 % EX CREA: 1.0000 "application " | TOPICAL_CREAM | Freq: Two times a day (BID) | CUTANEOUS | 0 refills | Status: DC

## 2021-12-05 MED ORDER — NYSTATIN 100000 UNIT/GM EX CREA: 1.0000 "application " | TOPICAL_CREAM | Freq: Two times a day (BID) | CUTANEOUS | 2 refills | Status: DC

## 2021-12-11 ENCOUNTER — Ambulatory Visit (INDEPENDENT_AMBULATORY_CARE_PROVIDER_SITE_OTHER): Payer: Medicare Other | Admitting: Internal Medicine

## 2021-12-11 ENCOUNTER — Encounter: Payer: Self-pay | Admitting: Internal Medicine

## 2021-12-11 VITALS — BP 138/86 | HR 110 | Ht 65.0 in | Wt >= 6400 oz

## 2021-12-11 DIAGNOSIS — I10 Essential (primary) hypertension: Secondary | ICD-10-CM

## 2021-12-11 DIAGNOSIS — L0882 Omphalitis not of newborn: Secondary | ICD-10-CM

## 2021-12-11 DIAGNOSIS — E782 Mixed hyperlipidemia: Secondary | ICD-10-CM | POA: Diagnosis not present

## 2021-12-11 DIAGNOSIS — K429 Umbilical hernia without obstruction or gangrene: Secondary | ICD-10-CM

## 2021-12-11 DIAGNOSIS — G4733 Obstructive sleep apnea (adult) (pediatric): Secondary | ICD-10-CM | POA: Diagnosis not present

## 2021-12-11 DIAGNOSIS — J4520 Mild intermittent asthma, uncomplicated: Secondary | ICD-10-CM

## 2021-12-11 DIAGNOSIS — E1169 Type 2 diabetes mellitus with other specified complication: Secondary | ICD-10-CM | POA: Diagnosis not present

## 2021-12-11 MED ORDER — CLOTRIMAZOLE-BETAMETHASONE 1-0.05 % EX CREA: 1.0000 "application " | TOPICAL_CREAM | Freq: Every day | CUTANEOUS | 0 refills | Status: DC

## 2021-12-11 MED ORDER — BENZONATATE 100 MG PO CAPS: 100.0000 mg | ORAL_CAPSULE | Freq: Two times a day (BID) | ORAL | 0 refills | Status: DC | PRN

## 2021-12-11 MED ORDER — ALBUTEROL SULFATE HFA 108 (90 BASE) MCG/ACT IN AERS: 1.0000 | INHALATION_SPRAY | Freq: Four times a day (QID) | RESPIRATORY_TRACT | 3 refills | Status: DC | PRN

## 2021-12-11 NOTE — Progress Notes (Signed)
? ?Established Patient Office Visit ? ?Subjective:  ?Patient ID: Jasmin Castro, female    DOB: 1975-10-16  Age: 46 y.o. MRN: [id number] ? ?CC:  ?Chief Complaint  ?Patient presents with  ? Follow-up  ?  Pt complains of sob and cough. Also has concerns about hernia in naval, has itchiness.   ? ? ?HPI ?Jasmin Castro is a 46 y.o. female with past medical history of HTN, DM, morbid obesity with metabolic syndrome, HLD, asthma and depression with anxiety who presents for f/u of her chronic medical conditions. ? ?Asthma: She has been complaining of dry cough and dyspnea recently.  She does not have her albuterol inhaler currently.  She denies any fever, chills, nasal congestion, sore throat or postnasal drip currently. ? ?BP is well-controlled. Takes medications regularly. Patient denies headache, dizziness, chest pain, dyspnea or palpitations. ? ?DM: Has been taking Trulicity. Denies any polyuria or polyphagia. Her last HbA1C was 6.3 in 09/22. Her weight has been stable compared to her last office visit in 04/22. ? ?Her BMI is >77.  Her ambulation and overall physical activity is limited due to morbid obesity.  She is currently independent with her ADLs.  She is able to drive and gets help for transportation from her family members at times. ? ?She c/o umbilical area bleeding and itching. She has h/o umbilical hernia and was referred to General surgeon for eval, but has not been able to see them yet. ? ? ? ?Past Medical History:  ?Diagnosis Date  ? Allergy   ? Anemia   ? Arthritis   ? Asthma   ? Depression   ? Diabetes mellitus without complication ([place]) 0071  ? GERD (gastroesophageal reflux disease)   ? Hyperlipemia   ? Hypertension   ? Obesity   ? Panic attacks   ? Paranoid ([place])   ? PCOS (polycystic ovarian syndrome)   ? Prediabetes   ? Sleep apnea   ? Urge incontinence 07/19/2013  ? ? ?Past Surgical History:  ?Procedure Laterality Date  ? HYSTEROSCOPY DIAGNOSTIC    ? insulin resistant    ? ? ?Family History  ?Problem  Relation Age of Onset  ? Hyperlipidemia Mother   ? Arthritis Mother   ? Diabetes Mother   ? Heart disease Mother   ? Stroke Mother   ? Hyperlipidemia Father   ? Cancer Father   ?     colon and prostate , brain tumor  ? Diabetes Sister   ?     prediatic  ? Arthritis Sister   ? Hyperlipidemia Sister   ? Obesity Sister   ? Hyperlipidemia Brother   ? Hypertension Brother   ? Anxiety disorder Brother   ? Arthritis Brother   ? Diabetes Brother   ? Obesity Brother   ? Diabetes Sister   ? ? ?Social History  ? ?Socioeconomic History  ? Marital status: Single  ?  Spouse name: Not on file  ? Number of children: Not on file  ? Years of education: Not on file  ? Highest education level: Not on file  ?Occupational History  ? Not on file  ?Tobacco Use  ? Smoking status: Never  ? Smokeless tobacco: Never  ? Tobacco comments:  ?  Never smoked  ?Substance and Sexual Activity  ? Alcohol use: No  ? Drug use: No  ? Sexual activity: Never  ?  Birth control/protection: None  ?Other Topics Concern  ? Not on file  ?Social History Narrative  ? Lives  alone but pt states she sees family daily.  ? ?Social Determinants of Health  ? ?Financial Resource Strain: Low Risk   ? Difficulty of Paying Living Expenses: Not hard at all  ?Food Insecurity: No Food Insecurity  ? Worried About Charity fundraiser in the Last Year: Never true  ? Ran Out of Food in the Last Year: Never true  ?Transportation Needs: No Transportation Needs  ? Lack of Transportation (Medical): No  ? Lack of Transportation (Non-Medical): No  ?Physical Activity: Inactive  ? Days of Exercise per Week: 0 days  ? Minutes of Exercise per Session: 0 min  ?Stress: No Stress Concern Present  ? Feeling of Stress : Not at all  ?Social Connections: Socially Isolated  ? Frequency of Communication with Friends and Family: More than three times a week  ? Frequency of Social Gatherings with Friends and Family: More than three times a week  ? Attends Religious Services: Never  ? Active Member of  Clubs or Organizations: No  ? Attends Archivist Meetings: Never  ? Marital Status: Never married  ?Intimate Partner Violence: Not At Risk  ? Fear of Current or Ex-Partner: No  ? Emotionally Abused: No  ? Physically Abused: No  ? Sexually Abused: No  ? ? ?Outpatient Medications Prior to Visit  ?Medication Sig Dispense Refill  ? acetaminophen (TYLENOL) 325 MG tablet Take 650 mg by mouth every 6 (six) hours as needed.    ? Cetirizine HCl (ZYRTEC ALLERGY) 10 MG TBDP Take 10 mg by mouth at bedtime. 30 tablet 0  ? clotrimazole (CLOTRIMAZOLE ANTI-FUNGAL) 1 % cream Apply 1 application. topically 2 (two) times daily. To foot 30 g 0  ? enalapril (VASOTEC) 20 MG tablet Take 1 tablet (20 mg total) by mouth daily. 30 tablet 5  ? fluticasone (FLONASE) 50 MCG/ACT nasal spray USE 2 SPRAYS IN EACH NOSTRIL ONCE DAILY. SHAKE GENTLY BEFORE USING. 16 g 6  ? HYDROcodone-acetaminophen (NORCO/VICODIN) 5-325 MG tablet Take 1 tablet by mouth every 6 (six) hours as needed. 20 tablet 0  ? IBU 600 MG tablet Take 600 mg by mouth 2 (two) times daily as needed.    ? LORazepam (ATIVAN) 1 MG tablet Take 1 mg by mouth at bedtime.    ? meclizine (ANTIVERT) 12.5 MG tablet TAKE ONE TABLET BY MOUTH THREE TIMES DAILY AS NEEDED FOR DIZZINESS OR NAUSEA 30 tablet 0  ? mupirocin ointment (BACTROBAN) 2 % APPLY TOPICALLY TWICE DAILY TO NAVAL 22 g 0  ? nystatin cream (MYCOSTATIN) Apply 1 application. topically 2 (two) times daily. 30 g 2  ? OLANZapine (ZYPREXA) 15 MG tablet Take 15 mg by mouth at bedtime.    ? Omega-3 Fatty Acids (FISH OIL) 1000 MG CAPS Take 1 capsule by mouth daily.    ? PRISTIQ 100 MG 24 hr tablet Take 100 mg by mouth at bedtime.    ? rosuvastatin (CRESTOR) 10 MG tablet Take 1 tablet (10 mg total) by mouth 3 (three) times a week. 30 tablet 3  ? TRULICITY 3 LE/7.5TZ SOPN INJECT 3MG AS DIRECTED ONCE A WEEK 2 mL 3  ? albuterol (VENTOLIN HFA) 108 (90 Base) MCG/ACT inhaler INHALE TWO PUFFS INTO THE LUNGS EVERY 6 HOURS AS NEEDED FOR FOR  WHEEZING AND SHORTNESS OF BREATH. 8.5 g 3  ? benzonatate (TESSALON) 100 MG capsule Take 1 capsule (100 mg total) by mouth 2 (two) times daily as needed for cough. 20 capsule 0  ? ?No facility-administered medications prior to  visit.  ? ? ?Allergies  ?Allergen Reactions  ? Crestor [Rosuvastatin] Swelling  ?  Pt c/o swelling to lips.  ? Keflex [Cephalexin]   ?  Headache ?  ? Lodine [Etodolac] Other (See Comments)  ?  Dizziness   ? Seroquel [Quetiapine Fumarate]   ?  Itching ?  ? ? ?ROS ?Review of Systems  ?Constitutional:  Negative for chills and fever.  ?HENT:  Negative for congestion, ear pain, sinus pressure, sinus pain and sore throat.   ?Eyes:  Negative for pain and discharge.  ?Respiratory:  Positive for cough and shortness of breath.   ?Cardiovascular:  Negative for chest pain and palpitations.  ?Gastrointestinal:  Negative for abdominal pain, diarrhea, nausea and vomiting.  ?Endocrine: Negative for polydipsia and polyuria.  ?Genitourinary:  Negative for dysuria and hematuria.  ?Musculoskeletal:  Negative for neck pain and neck stiffness.  ?Skin:  Negative for rash.  ?Neurological:  Negative for dizziness and weakness.  ?Psychiatric/Behavioral:  Negative for agitation and behavioral problems.   ? ?  ?Objective:  ?  ?Physical Exam ?Vitals reviewed.  ?Constitutional:   ?   General: She is not in acute distress. ?   Appearance: She is obese. She is not diaphoretic.  ?HENT:  ?   Head: Normocephalic and atraumatic.  ?   Nose: Nose normal.  ?   Mouth/Throat:  ?   Mouth: Mucous membranes are moist.  ?Eyes:  ?   General: No scleral icterus. ?   Extraocular Movements: Extraocular movements intact.  ?Cardiovascular:  ?   Rate and Rhythm: Normal rate and regular rhythm.  ?   Pulses: Normal pulses.  ?   Heart sounds: Normal heart sounds. No murmur heard. ?Pulmonary:  ?   Breath sounds: Normal breath sounds. No wheezing or rales.  ?Abdominal:  ?   Palpations: Abdomen is soft.  ?   Tenderness: There is no abdominal  tenderness.  ?   Comments: Umbilical hernia, crusting noted  ?Musculoskeletal:  ?   Cervical back: Neck supple. No tenderness.  ?   Right lower leg: No edema.  ?   Left lower leg: No edema.  ?Skin: ?   General:

## 2021-12-11 NOTE — Patient Instructions (Signed)
Please use Lotrisone cream for umbilical itching. ? ?Please continue to take other medications as prescribed. ? ?Please continue to follow low carb diet and ambulate as tolerated. ?

## 2021-12-11 NOTE — Assessment & Plan Note (Signed)
Lab Results  ?Component Value Date  ? HGBA1C 6.3 (H) 05/22/2021  ? ?Well-controlled with Trulicity ?On statin and ACEi ?Diabetic eye exam: Advised to follow up with Ophthalmology for diabetic eye exam ?

## 2021-12-11 NOTE — Assessment & Plan Note (Signed)
BP Readings from Last 1 Encounters:  ?12/11/21 138/86  ? ?Well-controlled with Enalapril ?Counseled for compliance with the medications ?Advised DASH diet and moderate exercise/walking as tolerated ?

## 2021-12-11 NOTE — Assessment & Plan Note (Signed)
Referred to general surgery as she has bleeding and itching in the area at times ?

## 2021-12-11 NOTE — Assessment & Plan Note (Signed)
Refilled albuterol for now ?Referred to pulmonology for evaluation of OSA/OHS and asthma ?

## 2021-12-11 NOTE — Assessment & Plan Note (Addendum)
Her dyspnea is also likely due to OSA/OHS ?Referred to Pulmonology for further evaluation ?Needs to lose weight ?

## 2021-12-11 NOTE — Assessment & Plan Note (Signed)
Part of metabolic syndrome ?Has not been taking Crestor, advised to continue, refills provided ?

## 2021-12-12 ENCOUNTER — Telehealth: Payer: Self-pay | Admitting: Internal Medicine

## 2021-12-12 NOTE — Telephone Encounter (Signed)
Patient called in regard to TRULICTY ? ?Patient ststes that she usually takes injection on Saturdays , patient missed dose last Saturday . ? ?Wants a call back in regard of next steps to take. ?

## 2021-12-13 LAB — CMP14+EGFR
ALT: 50 IU/L — ABNORMAL HIGH (ref 0–32)
AST: 60 IU/L — ABNORMAL HIGH (ref 0–40)
Albumin/Globulin Ratio: 1.3 (ref 1.2–2.2)
Albumin: 4.2 g/dL (ref 3.8–4.8)
Alkaline Phosphatase: 180 IU/L — ABNORMAL HIGH (ref 44–121)
BUN/Creatinine Ratio: 12 (ref 9–23)
BUN: 8 mg/dL (ref 6–24)
Bilirubin Total: 0.5 mg/dL (ref 0.0–1.2)
CO2: 22 mmol/L (ref 20–29)
Calcium: 9.9 mg/dL (ref 8.7–10.2)
Chloride: 100 mmol/L (ref 96–106)
Creatinine, Ser: 0.67 mg/dL (ref 0.57–1.00)
Globulin, Total: 3.2 g/dL (ref 1.5–4.5)
Glucose: 177 mg/dL — ABNORMAL HIGH (ref 70–99)
Potassium: 4.3 mmol/L (ref 3.5–5.2)
Sodium: 142 mmol/L (ref 134–144)
Total Protein: 7.4 g/dL (ref 6.0–8.5)
eGFR: 108 mL/min/{1.73_m2} (ref >59)

## 2021-12-13 LAB — CBC WITH DIFFERENTIAL/PLATELET
Basophils Absolute: 0.1 10*3/uL (ref 0.0–0.2)
Basos: 0 %
EOS (ABSOLUTE): 0.1 10*3/uL (ref 0.0–0.4)
Eos: 1 %
Hematocrit: 40.4 % (ref 34.0–46.6)
Hemoglobin: 12.9 g/dL (ref 11.1–15.9)
Immature Grans (Abs): 0.2 10*3/uL — ABNORMAL HIGH (ref 0.0–0.1)
Immature Granulocytes: 1 %
Lymphocytes Absolute: 4 10*3/uL — ABNORMAL HIGH (ref 0.7–3.1)
Lymphs: 26 %
MCH: 26.3 pg — ABNORMAL LOW (ref 26.6–33.0)
MCHC: 31.9 g/dL (ref 31.5–35.7)
MCV: 82 fL (ref 79–97)
Monocytes Absolute: 1.1 10*3/uL — ABNORMAL HIGH (ref 0.1–0.9)
Monocytes: 7 %
Neutrophils Absolute: 10.1 10*3/uL — ABNORMAL HIGH (ref 1.4–7.0)
Neutrophils: 65 %
Platelets: 468 10*3/uL — ABNORMAL HIGH (ref 150–450)
RBC: 4.9 x10E6/uL (ref 3.77–5.28)
RDW: 14.5 % (ref 11.7–15.4)
WBC: 15.5 10*3/uL — ABNORMAL HIGH (ref 3.4–10.8)

## 2021-12-13 LAB — LIPID PANEL
Chol/HDL Ratio: 5.5 ratio — ABNORMAL HIGH (ref 0.0–4.4)
Cholesterol, Total: 258 mg/dL — ABNORMAL HIGH (ref 100–199)
HDL: 47 mg/dL (ref >39)
LDL Chol Calc (NIH): 187 mg/dL — ABNORMAL HIGH (ref 0–99)
Triglycerides: 134 mg/dL (ref 0–149)
VLDL Cholesterol Cal: 24 mg/dL (ref 5–40)

## 2021-12-13 LAB — HEMOGLOBIN A1C
Est. average glucose Bld gHb Est-mCnc: 157 mg/dL
Hgb A1c MFr Bld: 7.1 % — ABNORMAL HIGH (ref 4.8–5.6)

## 2021-12-15 ENCOUNTER — Other Ambulatory Visit: Payer: Self-pay | Admitting: Internal Medicine

## 2021-12-15 DIAGNOSIS — E782 Mixed hyperlipidemia: Secondary | ICD-10-CM

## 2021-12-15 MED ORDER — ATORVASTATIN CALCIUM 40 MG PO TABS: 40.0000 mg | ORAL_TABLET | Freq: Every day | ORAL | 3 refills | Status: DC

## 2021-12-15 NOTE — Telephone Encounter (Signed)
Pt advised with verbal understanding  ?

## 2021-12-15 NOTE — Telephone Encounter (Signed)
LVM for pt to call the office.

## 2021-12-22 DIAGNOSIS — F3130 Bipolar disorder, current episode depressed, mild or moderate severity, unspecified: Secondary | ICD-10-CM | POA: Diagnosis not present

## 2021-12-26 ENCOUNTER — Telehealth: Payer: Self-pay

## 2021-12-26 NOTE — Telephone Encounter (Signed)
atorvastatin (LIPITOR) 40 MG tablet  ?

## 2021-12-26 NOTE — Telephone Encounter (Signed)
Patient called said this medicine makes her hand, feet and eyes itch, can something else be called into her pharmacy. Patient said has stop taking this meds. ? ?Pharmacy; [facility] ?

## 2021-12-29 ENCOUNTER — Other Ambulatory Visit: Payer: Self-pay | Admitting: Internal Medicine

## 2022-01-19 DIAGNOSIS — F3130 Bipolar disorder, current episode depressed, mild or moderate severity, unspecified: Secondary | ICD-10-CM | POA: Diagnosis not present

## 2022-01-20 ENCOUNTER — Ambulatory Visit: Payer: Medicare Other | Admitting: General Surgery

## 2022-02-12 ENCOUNTER — Ambulatory Visit: Payer: Medicare Other | Admitting: General Surgery

## 2022-02-23 DIAGNOSIS — F3130 Bipolar disorder, current episode depressed, mild or moderate severity, unspecified: Secondary | ICD-10-CM | POA: Diagnosis not present

## 2022-03-03 ENCOUNTER — Other Ambulatory Visit: Payer: Self-pay | Admitting: Internal Medicine

## 2022-03-09 ENCOUNTER — Encounter: Payer: Self-pay | Admitting: Internal Medicine

## 2022-03-23 DIAGNOSIS — F3130 Bipolar disorder, current episode depressed, mild or moderate severity, unspecified: Secondary | ICD-10-CM | POA: Diagnosis not present

## 2022-03-31 DIAGNOSIS — L600 Ingrowing nail: Secondary | ICD-10-CM | POA: Diagnosis not present

## 2022-03-31 DIAGNOSIS — M79675 Pain in left toe(s): Secondary | ICD-10-CM | POA: Insufficient documentation

## 2022-04-27 DIAGNOSIS — F3130 Bipolar disorder, current episode depressed, mild or moderate severity, unspecified: Secondary | ICD-10-CM | POA: Diagnosis not present

## 2022-04-28 ENCOUNTER — Other Ambulatory Visit: Payer: Self-pay | Admitting: Internal Medicine

## 2022-04-28 MED ORDER — TRULICITY 3 MG/0.5ML ~~LOC~~ SOAJ: 3.0000 mg | SUBCUTANEOUS | 0 refills | Status: DC

## 2022-06-01 ENCOUNTER — Ambulatory Visit (INDEPENDENT_AMBULATORY_CARE_PROVIDER_SITE_OTHER): Payer: Medicare Other

## 2022-06-01 DIAGNOSIS — Z0000 Encounter for general adult medical examination without abnormal findings: Secondary | ICD-10-CM

## 2022-06-01 NOTE — Patient Instructions (Signed)
[name] , Thank you for taking time to come for your Medicare Wellness Visit. I appreciate your ongoing commitment to your health goals. Please review the following plan we discussed and let me know if I can assist you in the future.   Screening recommendations/referrals: Recommended yearly ophthalmology/optometry visit for glaucoma screening and checkup Recommended yearly dental visit for hygiene and checkup  Vaccinations:   Advanced directives: Patient Declined  Conditions/risks identified: falls,hypertension, diabetes  Next appointment: 1 year  Preventive Care 40-64 Years, Female Preventive care refers to lifestyle choices and visits with your health care provider that can promote health and wellness. What does preventive care include? A yearly physical exam. This is also called an annual well check. Dental exams once or twice a year. Routine eye exams. Ask your health care provider how often you should have your eyes checked. Personal lifestyle choices, including: Daily care of your teeth and gums. Regular physical activity. Eating a healthy diet. Avoiding tobacco and drug use. Limiting alcohol use. Practicing safe sex. Taking low-dose aspirin daily starting at age 64. Taking vitamin and mineral supplements as recommended by your health care provider. What happens during an annual well check? The services and screenings done by your health care provider during your annual well check will depend on your age, overall health, lifestyle risk factors, and family history of disease. Counseling  Your health care provider may ask you questions about your: Alcohol use. Tobacco use. Drug use. Emotional well-being. Home and relationship well-being. Sexual activity. Eating habits. Work and work Statistician. Method of birth control. Menstrual cycle. Pregnancy history. Screening  You may have the following tests or measurements: Height, weight, and BMI. Blood  pressure. Lipid and cholesterol levels. These may be checked every 5 years, or more frequently if you are over 75 years old. Skin check. Lung cancer screening. You may have this screening every year starting at age 30 if you have a 30-pack-year history of smoking and currently smoke or have quit within the past 15 years. Fecal occult blood test (FOBT) of the stool. You may have this test every year starting at age 38. Flexible sigmoidoscopy or colonoscopy. You may have a sigmoidoscopy every 5 years or a colonoscopy every 10 years starting at age 39. Hepatitis C blood test. Hepatitis B blood test. Sexually transmitted disease (STD) testing. Diabetes screening. This is done by checking your blood sugar (glucose) after you have not eaten for a while (fasting). You may have this done every 1-3 years. Mammogram. This may be done every 1-2 years. Talk to your health care provider about when you should start having regular mammograms. This may depend on whether you have a family history of breast cancer. BRCA-related cancer screening. This may be done if you have a family history of breast, ovarian, tubal, or peritoneal cancers. Pelvic exam and Pap test. This may be done every 3 years starting at age 82. Starting at age 10, this may be done every 5 years if you have a Pap test in combination with an HPV test. Bone density scan. This is done to screen for osteoporosis. You may have this scan if you are at high risk for osteoporosis. Discuss your test results, treatment options, and if necessary, the need for more tests with your health care provider. Vaccines  Your health care provider may recommend certain vaccines, such as: Influenza vaccine. This is recommended every year. Tetanus, diphtheria, and acellular pertussis (Tdap, Td) vaccine. You may need a Td booster every 10 years.  Zoster vaccine. You may need this after age 62. Pneumococcal 13-valent conjugate (PCV13) vaccine. You may need this if you  have certain conditions and were not previously vaccinated. Pneumococcal polysaccharide (PPSV23) vaccine. You may need one or two doses if you smoke cigarettes or if you have certain conditions. Talk to your health care provider about which screenings and vaccines you need and how often you need them. This information is not intended to replace advice given to you by your health care provider. Make sure you discuss any questions you have with your health care provider. Document Released: 09/27/2015 Document Revised: 05/20/2016 Document Reviewed: 07/02/2015 Elsevier Interactive Patient Education  2017 [place] Prevention in the Home Falls can cause injuries. They can happen to people of all ages. There are many things you can do to make your home safe and to help prevent falls. What can I do on the outside of my home? Regularly fix the edges of walkways and driveways and fix any cracks. Remove anything that might make you trip as you walk through a door, such as a raised step or threshold. Trim any bushes or trees on the path to your home. Use bright outdoor lighting. Clear any walking paths of anything that might make someone trip, such as rocks or tools. Regularly check to see if handrails are loose or broken. Make sure that both sides of any steps have handrails. Any raised decks and porches should have guardrails on the edges. Have any leaves, snow, or ice cleared regularly. Use sand or salt on walking paths during winter. Clean up any spills in your garage right away. This includes oil or grease spills. What can I do in the bathroom? Use night lights. Install grab bars by the toilet and in the tub and shower. Do not use towel bars as grab bars. Use non-skid mats or decals in the tub or shower. If you need to sit down in the shower, use a plastic, non-slip stool. Keep the floor dry. Clean up any water that spills on the floor as soon as it happens. Remove soap buildup in  the tub or shower regularly. Attach bath mats securely with double-sided non-slip rug tape. Do not have throw rugs and other things on the floor that can make you trip. What can I do in the bedroom? Use night lights. Make sure that you have a light by your bed that is easy to reach. Do not use any sheets or blankets that are too big for your bed. They should not hang down onto the floor. Have a firm chair that has side arms. You can use this for support while you get dressed. Do not have throw rugs and other things on the floor that can make you trip. What can I do in the kitchen? Clean up any spills right away. Avoid walking on wet floors. Keep items that you use a lot in easy-to-reach places. If you need to reach something above you, use a strong step stool that has a grab bar. Keep electrical cords out of the way. Do not use floor polish or wax that makes floors slippery. If you must use wax, use non-skid floor wax. Do not have throw rugs and other things on the floor that can make you trip. What can I do with my stairs? Do not leave any items on the stairs. Make sure that there are handrails on both sides of the stairs and use them. Fix handrails that are broken  or loose. Make sure that handrails are as long as the stairways. Check any carpeting to make sure that it is firmly attached to the stairs. Fix any carpet that is loose or worn. Avoid having throw rugs at the top or bottom of the stairs. If you do have throw rugs, attach them to the floor with carpet tape. Make sure that you have a light switch at the top of the stairs and the bottom of the stairs. If you do not have them, ask someone to add them for you. What else can I do to help prevent falls? Wear shoes that: Do not have high heels. Have rubber bottoms. Are comfortable and fit you well. Are closed at the toe. Do not wear sandals. If you use a stepladder: Make sure that it is fully opened. Do not climb a closed  stepladder. Make sure that both sides of the stepladder are locked into place. Ask someone to hold it for you, if possible. Clearly mark and make sure that you can see: Any grab bars or handrails. First and last steps. Where the edge of each step is. Use tools that help you move around (mobility aids) if they are needed. These include: Canes. Walkers. Scooters. Crutches. Turn on the lights when you go into a dark area. Replace any light bulbs as soon as they burn out. Set up your furniture so you have a clear path. Avoid moving your furniture around. If any of your floors are uneven, fix them. If there are any pets around you, be aware of where they are. Review your medicines with your doctor. Some medicines can make you feel dizzy. This can increase your chance of falling. Ask your doctor what other things that you can do to help prevent falls. This information is not intended to replace advice given to you by your health care provider. Make sure you discuss any questions you have with your health care provider. Document Released: 06/27/2009 Document Revised: 02/06/2016 Document Reviewed: 10/05/2014 Elsevier Interactive Patient Education  2017 [employer].

## 2022-06-01 NOTE — Progress Notes (Deleted)
Subjective:   Jasmin Castro is a 46 y.o. female who presents for Medicare Annual (Subsequent) preventive examination.  Review of Systems    ***       Objective:    There were no vitals filed for this visit. There is no height or weight on file to calculate BMI.     05/28/2021    9:18 AM 09/16/2018   10:26 AM 12/21/2014    9:45 AM  Advanced Directives  Does Patient Have a Medical Advance Directive? No No No  Would patient like information on creating a medical advance directive? No - Patient declined No - Patient declined No - patient declined information    Current Medications (verified) Outpatient Encounter Medications as of 06/01/2022  Medication Sig  . acetaminophen (TYLENOL) 325 MG tablet Take 650 mg by mouth every 6 (six) hours as needed.  Marland Kitchen albuterol (VENTOLIN HFA) 108 (90 Base) MCG/ACT inhaler Inhale 1-2 puffs into the lungs every 6 (six) hours as needed for wheezing or shortness of breath.  Marland Kitchen atorvastatin (LIPITOR) 40 MG tablet Take 1 tablet (40 mg total) by mouth daily.  . benzonatate (TESSALON) 100 MG capsule Take 1 capsule (100 mg total) by mouth 2 (two) times daily as needed for cough.  . Cetirizine HCl (ZYRTEC ALLERGY) 10 MG TBDP Take 10 mg by mouth at bedtime.  . clotrimazole (CLOTRIMAZOLE ANTI-FUNGAL) 1 % cream Apply 1 application. topically 2 (two) times daily. To foot  . clotrimazole-betamethasone (LOTRISONE) cream Apply 1 application. topically daily.  . Dulaglutide (TRULICITY) 3 MG/0.5ML SOPN Inject 3 mg into the skin once a week.  . enalapril (VASOTEC) 20 MG tablet TAKE ONE TABLET BY MOUTH DAILY  . fluticasone (FLONASE) 50 MCG/ACT nasal spray USE 2 SPRAYS IN EACH NOSTRIL ONCE DAILY. SHAKE GENTLY BEFORE USING.  Marland Kitchen HYDROcodone-acetaminophen (NORCO/VICODIN) 5-325 MG tablet Take 1 tablet by mouth every 6 (six) hours as needed.  . IBU 600 MG tablet Take 600 mg by mouth 2 (two) times daily as needed.  Marland Kitchen LORazepam (ATIVAN) 1 MG tablet Take 1 mg by mouth at bedtime.   . meclizine (ANTIVERT) 12.5 MG tablet TAKE ONE TABLET BY MOUTH THREE TIMES DAILY AS NEEDED FOR DIZZINESS OR NAUSEA  . mupirocin ointment (BACTROBAN) 2 % APPLY TOPICALLY TWICE DAILY TO [place]  . nystatin cream (MYCOSTATIN) Apply 1 application. topically 2 (two) times daily.  Marland Kitchen OLANZapine (ZYPREXA) 15 MG tablet Take 15 mg by mouth at bedtime.  . Omega-3 Fatty Acids (FISH OIL) 1000 MG CAPS Take 1 capsule by mouth daily.  Marland Kitchen PRISTIQ 100 MG 24 hr tablet Take 100 mg by mouth at bedtime.   No facility-administered encounter medications on file as of 06/01/2022.    Allergies (verified) Crestor [rosuvastatin], Keflex [cephalexin], Lodine [etodolac], and Seroquel [quetiapine fumarate]   History: Past Medical History:  Diagnosis Date  . Allergy   . Anemia   . Arthritis   . Asthma   . Depression   . Diabetes mellitus without complication (HCC) 2021  . GERD (gastroesophageal reflux disease)   . Hyperlipemia   . Hypertension   . Obesity   . Panic attacks   . Paranoid (HCC)   . PCOS (polycystic ovarian syndrome)   . Prediabetes   . Sleep apnea   . Urge incontinence 07/19/2013   Past Surgical History:  Procedure Laterality Date  . HYSTEROSCOPY DIAGNOSTIC    . insulin resistant     Family History  Problem Relation Age of Onset  . Hyperlipidemia Mother   .  Arthritis Mother   . Diabetes Mother   . Heart disease Mother   . Stroke Mother   . Hyperlipidemia Father   . Cancer Father        colon and prostate , brain tumor  . Diabetes Sister        prediatic  . Arthritis Sister   . Hyperlipidemia Sister   . Obesity Sister   . Hyperlipidemia Brother   . Hypertension Brother   . Anxiety disorder Brother   . Arthritis Brother   . Diabetes Brother   . Obesity Brother   . Diabetes Sister    Social History   Socioeconomic History  . Marital status: Single    Spouse name: Not on file  . Number of children: Not on file  . Years of education: Not on file  . Highest education level:  Not on file  Occupational History  . Not on file  Tobacco Use  . Smoking status: Never  . Smokeless tobacco: Never  . Tobacco comments:    Never smoked  Substance and Sexual Activity  . Alcohol use: No  . Drug use: No  . Sexual activity: Never    Birth control/protection: None  Other Topics Concern  . Not on file  Social History Narrative   Lives alone but pt states she sees family daily.   Social Determinants of Health   Financial Resource Strain: Low Risk  (05/28/2021)   Overall Financial Resource Strain (CARDIA)   . Difficulty of Paying Living Expenses: Not hard at all  Food Insecurity: No Food Insecurity (05/28/2021)   Hunger Vital Sign   . Worried About Programme researcher, broadcasting/film/videounning Out of Food in the Last Year: Never true   . Ran Out of Food in the Last Year: Never true  Transportation Needs: No Transportation Needs (05/28/2021)   PRAPARE - Transportation   . Lack of Transportation (Medical): No   . Lack of Transportation (Non-Medical): No  Physical Activity: Inactive (05/28/2021)   Exercise Vital Sign   . Days of Exercise per Week: 0 days   . Minutes of Exercise per Session: 0 min  Stress: No Stress Concern Present (05/28/2021)   [employer] - Occupational Stress Questionnaire   . Feeling of Stress : Not at all  Social Connections: Socially Isolated (05/28/2021)   Social Connection and Isolation Panel [NHANES]   . Frequency of Communication with Friends and Family: More than three times a week   . Frequency of Social Gatherings with Friends and Family: More than three times a week   . Attends Religious Services: Never   . Active Member of Clubs or Organizations: No   . Attends BankerClub or Organization Meetings: Never   . Marital Status: Never married    Tobacco Counseling Counseling given: Not Answered Tobacco comments: Never smoked   Clinical Intake:              How often do you need to have someone help you when you read instructions, pamphlets,  or other written materials from your doctor or pharmacy?: (P) 2 - Rarely  Diabetic?***         Activities of Daily Living    05/30/2022   12:48 PM  In your present state of health, do you have any difficulty performing the following activities:  Hearing? 1  Vision? 0  Difficulty concentrating or making decisions? 1  Walking or climbing stairs? 1  Dressing or bathing? 0  Doing errands, shopping? 0  Preparing Food and  eating ? N  Using the Toilet? N  In the past six months, have you accidently leaked urine? Y  Do you have problems with loss of bowel control? N  Managing your Medications? N  Managing your Finances? N  Housekeeping or managing your Housekeeping? N    Patient Care Team: Anabel Halon, MD as PCP - General (Internal Medicine)  Indicate any recent Medical Services you may have received from other than [facility] providers in the past year (date may be approximate).     Assessment:   This is a routine wellness examination for Jasmin Castro.  Hearing/Vision screen No results found.  Dietary issues and exercise activities discussed:     Goals Addressed   None   Depression Screen    12/11/2021    2:40 PM 10/14/2021    1:31 PM 08/21/2021   11:15 AM 05/28/2021    9:14 AM 04/29/2021    1:44 PM 03/04/2021    8:09 AM 12/26/2020    4:08 PM  PHQ 2/9 Scores  PHQ - 2 Score 0 0 0 0 0 0 0  PHQ- 9 Score    0 0      Fall Risk    05/30/2022   12:48 PM 12/11/2021    2:40 PM 10/14/2021    1:31 PM 08/21/2021   11:14 AM 05/28/2021    9:19 AM  Fall Risk   Falls in the past year? 0 0 0 0 0  Number falls in past yr: 0 0 0 0 0  Injury with Fall? 0 0 0 0 1  Risk for fall due to :  No Fall Risks No Fall Risks No Fall Risks Impaired mobility;Orthopedic patient  Follow up  Falls evaluation completed Falls evaluation completed Falls evaluation completed Falls prevention discussed    FALL RISK PREVENTION PERTAINING TO THE HOME:  Any stairs in or around the home? {YES/NO:21197} If so,  are there any without handrails? {YES/NO:21197} Home free of loose throw rugs in walkways, pet beds, electrical cords, etc? {YES/NO:21197} Adequate lighting in your home to reduce risk of falls? {YES/NO:21197}  ASSISTIVE DEVICES UTILIZED TO PREVENT FALLS:  Life alert? {YES/NO:21197} Use of a cane, walker or w/c? {YES/NO:21197} Grab bars in the bathroom? {YES/NO:21197} Shower chair or bench in shower? {YES/NO:21197} Elevated toilet seat or a handicapped toilet? {YES/NO:21197}  TIMED UP AND GO:  Was the test performed? {YES/NO:21197}.  Length of time to ambulate 10 feet: *** sec.   {Appearance of ZOXW:9604540}  Cognitive Function:        05/28/2021    9:21 AM  6CIT Screen  What Year? 0 points  What month? 0 points  What time? 0 points  Count back from 20 0 points  Months in reverse 0 points  Repeat phrase 2 points  Total Score 2 points    Immunizations Immunization History  Administered Date(s) Administered  . Influenza Split 07/18/2012  . Influenza,inj,Quad PF,6+ Mos 10/13/2013, 09/12/2014, 05/21/2015    {TDAP status:2101805}  {Flu Vaccine status:2101806}  {Pneumococcal vaccine status:2101807}  {Covid-19 vaccine status:2101808}  Qualifies for Shingles Vaccine? {YES/NO:21197}  Zostavax completed {YES/NO:21197}  {Shingrix Completed?:2101804}  Screening Tests Health Maintenance  Topic Date Due  . COVID-19 Vaccine (1) Never done  . OPHTHALMOLOGY EXAM  Never done  . Diabetic kidney evaluation - Urine ACR  Never done  . TETANUS/TDAP  Never done  . PAP SMEAR-Modifier  07/19/2016  . FOOT EXAM  12/26/2021  . INFLUENZA VACCINE  04/14/2022  . HEMOGLOBIN A1C  06/13/2022  .  Diabetic kidney evaluation - GFR measurement  12/12/2022  . Fecal DNA (Cologuard)  11/05/2024  . Hepatitis C Screening  Completed  . HIV Screening  Completed  . HPV VACCINES  Aged Out    Health Maintenance  Health Maintenance Due  Topic Date Due  . COVID-19 Vaccine (1) Never done  .  OPHTHALMOLOGY EXAM  Never done  . Diabetic kidney evaluation - Urine ACR  Never done  . TETANUS/TDAP  Never done  . PAP SMEAR-Modifier  07/19/2016  . FOOT EXAM  12/26/2021  . INFLUENZA VACCINE  04/14/2022    {Colorectal cancer screening:2101809}  {Mammogram status:21018020}  {Bone Density status:21018021}  Lung Cancer Screening: (Low Dose CT Chest recommended if Age 70-80 years, 30 pack-year currently smoking OR have quit w/in 15years.) {DOES NOT does:27190::"does not"} qualify.   Lung Cancer Screening Referral: ***  Additional Screening:  Hepatitis C Screening: {DOES NOT does:27190::"does not"} qualify; Completed ***  Vision Screening: Recommended annual ophthalmology exams for early detection of glaucoma and other disorders of the eye. Is the patient up to date with their annual eye exam?  {YES/NO:21197} Who is the provider or what is the name of the office in which the patient attends annual eye exams? *** If pt is not established with a provider, would they like to be referred to a provider to establish care? {YES/NO:21197}.   Dental Screening: Recommended annual dental exams for proper oral hygiene  Community Resource Referral / Chronic Care Management: CRR required this visit?  {YES/NO:21197}  CCM required this visit?  {YES/NO:21197}     Plan:     I have personally reviewed and noted the following in the patient's chart:   Medical and social history Use of alcohol, tobacco or illicit drugs  Current medications and supplements including opioid prescriptions. {Opioid Prescriptions:9148634919} Functional ability and status Nutritional status Physical activity Advanced directives List of other physicians Hospitalizations, surgeries, and ER visits in previous 12 months Vitals Screenings to include cognitive, depression, and falls Referrals and appointments  In addition, I have reviewed and discussed with patient certain preventive protocols, quality metrics, and  best practice recommendations. A written personalized care plan for preventive services as well as general preventive health recommendations were provided to patient.     Johny Drilling, [place]   06/01/2022   Nurse Notes: ***

## 2022-06-01 NOTE — Progress Notes (Signed)
Subjective:   Jasmin Castro is a 46 y.o. female who presents for Medicare Annual (Subsequent) preventive examination. I connected with  Jasmin Castro on 06/01/22 by a audio enabled telemedicine application and verified that I am speaking with the correct person using two identifiers.  Patient Location: Home  Provider Location: Office/Clinic  I discussed the limitations of evaluation and management by telemedicine. The patient expressed understanding and agreed to proceed.   Review of Systems     Jasmin Castro , Thank you for taking time to come for your Medicare Wellness Visit. I appreciate your ongoing commitment to your health goals. Please review the following plan we discussed and let me know if I can assist you in the future.   These are the goals we discussed:  Goals      Have 3 meals a day     Work on diet to bring down A1C. Referral to Nutritionist.        This is a list of the screening recommended for you and due dates:  Health Maintenance  Topic Date Due   COVID-19 Vaccine (1) Never done   Eye exam for diabetics  Never done   Yearly kidney health urinalysis for diabetes  Never done   Tetanus Vaccine  Never done   Pap Smear  07/19/2016   Complete foot exam   12/26/2021   Flu Shot  04/14/2022   Hemoglobin A1C  06/13/2022   Yearly kidney function blood test for diabetes  12/12/2022   Cologuard (Stool DNA test)  11/05/2024   Hepatitis C Screening: USPSTF Recommendation to screen - Ages 18-79 yo.  Completed   HIV Screening  Completed   HPV Vaccine  Aged Out          Objective:    There were no vitals filed for this visit. There is no height or weight on file to calculate BMI.     05/28/2021    9:18 AM 09/16/2018   10:26 AM 12/21/2014    9:45 AM  Advanced Directives  Does Patient Have a Medical Advance Directive? No No No  Would patient like information on creating a medical advance directive? No - Patient declined No - Patient declined No - patient  declined information    Current Medications (verified) Outpatient Encounter Medications as of 06/01/2022  Medication Sig   acetaminophen (TYLENOL) 325 MG tablet Take 650 mg by mouth every 6 (six) hours as needed.   albuterol (VENTOLIN HFA) 108 (90 Base) MCG/ACT inhaler Inhale 1-2 puffs into the lungs every 6 (six) hours as needed for wheezing or shortness of breath.   atorvastatin (LIPITOR) 40 MG tablet Take 1 tablet (40 mg total) by mouth daily.   benzonatate (TESSALON) 100 MG capsule Take 1 capsule (100 mg total) by mouth 2 (two) times daily as needed for cough.   Cetirizine HCl (ZYRTEC ALLERGY) 10 MG TBDP Take 10 mg by mouth at bedtime.   clotrimazole (CLOTRIMAZOLE ANTI-FUNGAL) 1 % cream Apply 1 application. topically 2 (two) times daily. To foot   clotrimazole-betamethasone (LOTRISONE) cream Apply 1 application. topically daily.   Dulaglutide (TRULICITY) 3 0000000 SOPN Inject 3 mg into the skin once a week.   enalapril (VASOTEC) 20 MG tablet TAKE ONE TABLET BY MOUTH DAILY   fluticasone (FLONASE) 50 MCG/ACT nasal spray USE 2 SPRAYS IN EACH NOSTRIL ONCE DAILY. SHAKE GENTLY BEFORE USING.   HYDROcodone-acetaminophen (NORCO/VICODIN) 5-325 MG tablet Take 1 tablet by mouth every 6 (six) hours as needed.  IBU 600 MG tablet Take 600 mg by mouth 2 (two) times daily as needed.   LORazepam (ATIVAN) 1 MG tablet Take 1 mg by mouth at bedtime.   meclizine (ANTIVERT) 12.5 MG tablet TAKE ONE TABLET BY MOUTH THREE TIMES DAILY AS NEEDED FOR DIZZINESS OR NAUSEA   mupirocin ointment (BACTROBAN) 2 % APPLY TOPICALLY TWICE DAILY TO [place]   nystatin cream (MYCOSTATIN) Apply 1 application. topically 2 (two) times daily.   OLANZapine (ZYPREXA) 15 MG tablet Take 15 mg by mouth at bedtime.   Omega-3 Fatty Acids (FISH OIL) 1000 MG CAPS Take 1 capsule by mouth daily.   PRISTIQ 100 MG 24 hr tablet Take 100 mg by mouth at bedtime.   No facility-administered encounter medications on file as of 06/01/2022.     Allergies (verified) Crestor [rosuvastatin], Keflex [cephalexin], Lodine [etodolac], and Seroquel [quetiapine fumarate]   History: Past Medical History:  Diagnosis Date   Allergy    Anemia    Arthritis    Asthma    Depression    Diabetes mellitus without complication ([place]) 123XX123   GERD (gastroesophageal reflux disease)    Hyperlipemia    Hypertension    Obesity    Panic attacks    Paranoid ([place])    PCOS (polycystic ovarian syndrome)    Prediabetes    Sleep apnea    Urge incontinence 07/19/2013   Past Surgical History:  Procedure Laterality Date   HYSTEROSCOPY DIAGNOSTIC     insulin resistant     Family History  Problem Relation Age of Onset   Hyperlipidemia Mother    Arthritis Mother    Diabetes Mother    Heart disease Mother    Stroke Mother    Hyperlipidemia Father    Cancer Father        colon and prostate , brain tumor   Diabetes Sister        prediatic   Arthritis Sister    Hyperlipidemia Sister    Obesity Sister    Hyperlipidemia Brother    Hypertension Brother    Anxiety disorder Brother    Arthritis Brother    Diabetes Brother    Obesity Brother    Diabetes Sister    Social History   Socioeconomic History   Marital status: Single    Spouse name: Not on file   Number of children: Not on file   Years of education: Not on file   Highest education level: Not on file  Occupational History   Not on file  Tobacco Use   Smoking status: Never   Smokeless tobacco: Never   Tobacco comments:    Never smoked  Substance and Sexual Activity   Alcohol use: No   Drug use: No   Sexual activity: Never    Birth control/protection: None  Other Topics Concern   Not on file  Social History Narrative   Lives alone but pt states she sees family daily.   Social Determinants of Health   Financial Resource Strain: Low Risk  (05/28/2021)   Overall Financial Resource Strain (CARDIA)    Difficulty of Paying Living Expenses: Not hard at all  Food  Insecurity: No Food Insecurity (05/28/2021)   Hunger Vital Sign    Worried About Running Out of Food in the Last Year: Never true    Ran Out of Food in the Last Year: Never true  Transportation Needs: No Transportation Needs (05/28/2021)   PRAPARE - Hydrologist (Medical): No  Lack of Transportation (Non-Medical): No  Physical Activity: Inactive (05/28/2021)   Exercise Vital Sign    Days of Exercise per Week: 0 days    Minutes of Exercise per Session: 0 min  Stress: No Stress Concern Present (05/28/2021)   [place]    Feeling of Stress : Not at all  Social Connections: Socially Isolated (05/28/2021)   Social Connection and Isolation Panel [NHANES]    Frequency of Communication with Friends and Family: More than three times a week    Frequency of Social Gatherings with Friends and Family: More than three times a week    Attends Religious Services: Never    Marine scientist or Organizations: No    Attends Archivist Meetings: Never    Marital Status: Never married    Tobacco Counseling Counseling given: Not Answered Tobacco comments: Never smoked   Clinical Intake:              How often do you need to have someone help you when you read instructions, pamphlets, or other written materials from your doctor or pharmacy?: (P) 2 - Rarely  Diabetic? Yes Nutrition Risk Assessment:  Has the patient had any N/V/D within the last 2 months?  No  Does the patient have any non-healing wounds?  No  Has the patient had any unintentional weight loss or weight gain?  No   Diabetes:  Is the patient diabetic?  Yes  If diabetic, was a CBG obtained today?  No  Did the patient bring in their glucometer from home?  No  How often do you monitor your CBG's? Once a month.   Financial Strains and Diabetes Management:  Are you having any financial strains with the device, your  supplies or your medication? No .  Does the patient want to be seen by Chronic Care Management for management of their diabetes?  No  Would the patient like to be referred to a Nutritionist or for Diabetic Management?  No   Diabetic Exams:  Diabetic Eye Exam:Overdue for diabetic eye exam. Pt has been advised about the importance in completing this exam. A referral has been placed today. Message sent to referral coordinator for scheduling purposes. Advised pt to expect a call from office referred to regarding appt.  Diabetic Foot Exam: Pt has been advised about the importance in completing this exam.       Activities of Daily Living    05/30/2022   12:48 PM  In your present state of health, do you have any difficulty performing the following activities:  Hearing? 1  Vision? 0  Difficulty concentrating or making decisions? 1  Walking or climbing stairs? 1  Dressing or bathing? 0  Doing errands, shopping? 0  Preparing Food and eating ? N  Using the Toilet? N  In the past six months, have you accidently leaked urine? Y  Do you have problems with loss of bowel control? N  Managing your Medications? N  Managing your Finances? N  Housekeeping or managing your Housekeeping? N    Patient Care Team: Lindell Spar, MD as PCP - General (Internal Medicine)  Indicate any recent Medical Services you may have received from other than [facility] providers in the past year (date may be approximate).     Assessment:   This is a routine wellness examination for Alaila.  Hearing/Vision screen No results found.  Dietary issues and exercise activities discussed:     Goals  Addressed   None    Depression Screen    12/11/2021    2:40 PM 10/14/2021    1:31 PM 08/21/2021   11:15 AM 05/28/2021    9:14 AM 04/29/2021    1:44 PM 03/04/2021    8:09 AM 12/26/2020    4:08 PM  PHQ 2/9 Scores  PHQ - 2 Score 0 0 0 0 0 0 0  PHQ- 9 Score    0 0      Fall Risk    05/30/2022   12:48 PM 12/11/2021    2:40  PM 10/14/2021    1:31 PM 08/21/2021   11:14 AM 05/28/2021    9:19 AM  [place] in the past year? 0 0 0 0 0  Number falls in past yr: 0 0 0 0 0  Injury with Fall? 0 0 0 0 1  Risk for fall due to :  No Fall Risks No Fall Risks No Fall Risks Impaired mobility;Orthopedic patient  Follow up  Falls evaluation completed Falls evaluation completed Falls evaluation completed Falls prevention discussed    FALL RISK PREVENTION PERTAINING TO THE HOME:  Any stairs in or around the home? Yes  If so, are there any without handrails? No  Home free of loose throw rugs in walkways, pet beds, electrical cords, etc? Yes  Adequate lighting in your home to reduce risk of falls? Yes   ASSISTIVE DEVICES UTILIZED TO PREVENT FALLS:  Life alert? No  Use of a cane, walker or w/c? Yes  Grab bars in the bathroom? Yes  Shower chair or bench in shower? Yes  Elevated toilet seat or a handicapped toilet? Yes   Cognitive Function:        05/28/2021    9:21 AM  6CIT Screen  What Year? 0 points  What month? 0 points  What time? 0 points  Count back from 20 0 points  Months in reverse 0 points  Repeat phrase 2 points  Total Score 2 points    Immunizations Immunization History  Administered Date(s) Administered   Influenza Split 07/18/2012   Influenza,inj,Quad PF,6+ Mos 10/13/2013, 09/12/2014, 05/21/2015    TDAP status: Due, Education has been provided regarding the importance of this vaccine. Advised may receive this vaccine at local pharmacy or Health Dept. Aware to provide a copy of the vaccination record if obtained from local pharmacy or Health Dept. Verbalized acceptance and understanding.  Flu Vaccine status: Due, Education has been provided regarding the importance of this vaccine. Advised may receive this vaccine at local pharmacy or Health Dept. Aware to provide a copy of the vaccination record if obtained from local pharmacy or Health Dept. Verbalized acceptance and  understanding.  Covid-19 vaccine status: Declined, Education has been provided regarding the importance of this vaccine but patient still declined. Advised may receive this vaccine at local pharmacy or Health Dept.or vaccine clinic. Aware to provide a copy of the vaccination record if obtained from local pharmacy or Health Dept. Verbalized acceptance and understanding.  Qualifies for Shingles Vaccine? No   Zostavax completed No   Shingrix Completed?: Yes  Screening Tests Health Maintenance  Topic Date Due   COVID-19 Vaccine (1) Never done   OPHTHALMOLOGY EXAM  Never done   Diabetic kidney evaluation - Urine ACR  Never done   TETANUS/TDAP  Never done   PAP SMEAR-Modifier  07/19/2016   FOOT EXAM  12/26/2021   INFLUENZA VACCINE  04/14/2022   HEMOGLOBIN A1C  06/13/2022  Diabetic kidney evaluation - GFR measurement  12/12/2022   Fecal DNA (Cologuard)  11/05/2024   Hepatitis C Screening  Completed   HIV Screening  Completed   HPV VACCINES  Aged Out    Health Maintenance  Health Maintenance Due  Topic Date Due   COVID-19 Vaccine (1) Never done   OPHTHALMOLOGY EXAM  Never done   Diabetic kidney evaluation - Urine ACR  Never done   TETANUS/TDAP  Never done   PAP SMEAR-Modifier  07/19/2016   FOOT EXAM  12/26/2021   INFLUENZA VACCINE  04/14/2022     Lung Cancer Screening: (Low Dose CT Chest recommended if Age 34-80 years, 30 pack-year currently smoking OR have quit w/in 15years.) does not qualify.     Additional Screening:  Hepatitis C Screening: does qualify; Completed 08/28/2020  Vision Screening: Recommended annual ophthalmology exams for early detection of glaucoma and other disorders of the eye. Is the patient up to date with their annual eye exam?  No   If pt is not established with a provider, would they like to be referred to a provider to establish care?  Patient declined .   Dental Screening: Recommended annual dental exams for proper oral hygiene  Community  Resource Referral / Chronic Care Management: CRR required this visit?  No   CCM required this visit?  No      Plan:     I have personally reviewed and noted the following in the patient's chart:   Medical and social history Use of alcohol, tobacco or illicit drugs  Current medications and supplements including opioid prescriptions. Patient is currently taking opioid prescriptions. Information provided to patient regarding non-opioid alternatives. Patient advised to discuss non-opioid treatment plan with their provider. Functional ability and status Nutritional status Physical activity Advanced directives List of other physicians Hospitalizations, surgeries, and ER visits in previous 12 months Vitals Screenings to include cognitive, depression, and falls Referrals and appointments  In addition, I have reviewed and discussed with patient certain preventive protocols, quality metrics, and best practice recommendations. A written personalized care plan for preventive services as well as general preventive health recommendations were provided to patient.     Johny Drilling, [place]   06/01/2022   Nurse Notes:  Ms. Crawley , Thank you for taking time to come for your Medicare Wellness Visit. I appreciate your ongoing commitment to your health goals. Please review the following plan we discussed and let me know if I can assist you in the future.   These are the goals we discussed:  Goals      Have 3 meals a day     Work on diet to bring down A1C. Referral to Nutritionist.     Patient Stated     Lose weight.        This is a list of the screening recommended for you and due dates:  Health Maintenance  Topic Date Due   COVID-19 Vaccine (1) Never done   Eye exam for diabetics  Never done   Yearly kidney health urinalysis for diabetes  Never done   Tetanus Vaccine  Never done   Pap Smear  07/19/2016   Complete foot exam   12/26/2021   Flu Shot  04/14/2022   Hemoglobin A1C   06/13/2022   Yearly kidney function blood test for diabetes  12/12/2022   Cologuard (Stool DNA test)  11/05/2024   Hepatitis C Screening: USPSTF Recommendation to screen - Ages 18-79 yo.  Completed   HIV Screening  Completed   HPV Vaccine  Aged Out

## 2022-06-10 ENCOUNTER — Encounter: Payer: Self-pay | Admitting: Internal Medicine

## 2022-06-10 ENCOUNTER — Ambulatory Visit (INDEPENDENT_AMBULATORY_CARE_PROVIDER_SITE_OTHER): Payer: Medicare Other | Admitting: Internal Medicine

## 2022-06-10 DIAGNOSIS — J0110 Acute frontal sinusitis, unspecified: Secondary | ICD-10-CM | POA: Diagnosis not present

## 2022-06-10 MED ORDER — AZITHROMYCIN 250 MG PO TABS: ORAL_TABLET | ORAL | 0 refills | Status: AC

## 2022-06-10 NOTE — Progress Notes (Signed)
Virtual Visit via Telephone Note   This visit type was conducted due to national recommendations for restrictions regarding the COVID-19 Pandemic (e.g. social distancing) in an effort to limit this patient's exposure and mitigate transmission in our community.  Due to her co-morbid illnesses, this patient is at least at moderate risk for complications without adequate follow up.  This format is felt to be most appropriate for this patient at this time.  The patient did not have access to video technology/had technical difficulties with video requiring transitioning to audio format only (telephone).  All issues noted in this document were discussed and addressed.  No physical exam could be performed with this format.  Evaluation Performed:  Follow-up visit  Date:  06/10/2022   ID:  Jasmin Castro, Jasmin Castro Nov 25, 1975, MRN [id number]  Patient Location: Home Provider Location: Office/Clinic  Participants: Patient Location of Patient: Home Location of Provider: Telehealth Consent was obtain for visit to be over via telehealth. I verified that I am speaking with the correct person using two identifiers.  PCP:  Lindell Spar, MD   Chief Complaint: Cough, sore throat and nasal congestion  History of Present Illness:    Jasmin Castro is a 46 y.o. female who has a televisit for complaint of cough, sore throat, nasal congestion and fever X 2 weeks.  She also complains of sinus pressure related headache.  She has not had fever in the last week.  She initially also had vomiting and loose BM, but have been improving now.  She denies any recent worsening of dyspnea or wheezing.  Of note, she has history of asthma, and uses albuterol inhaler for dyspnea.  She has tried Tylenol, Zyrtec and Flonase with some relief.  The patient does not have symptoms concerning for COVID-19 infection (fever, chills, cough, or new shortness of breath).   Past Medical, Surgical, Social History, Allergies, and  Medications have been Reviewed.  Past Medical History:  Diagnosis Date   Allergy    Anemia    Arthritis    Asthma    Depression    Diabetes mellitus without complication ([place]) 4696   GERD (gastroesophageal reflux disease)    Hyperlipemia    Hypertension    Obesity    Panic attacks    Paranoid ([place])    PCOS (polycystic ovarian syndrome)    Prediabetes    Sleep apnea    Urge incontinence 07/19/2013   Past Surgical History:  Procedure Laterality Date   HYSTEROSCOPY DIAGNOSTIC     insulin resistant       No outpatient medications have been marked as taking for the 06/10/22 encounter (Office Visit) with Lindell Spar, MD.     Allergies:   Crestor [rosuvastatin], Keflex [cephalexin], Lodine [etodolac], and Seroquel [quetiapine fumarate]   ROS:   Please see the history of present illness.     All other systems reviewed and are negative.   Labs/Other Tests and Data Reviewed:    Recent Labs: 12/11/2021: ALT 50; BUN 8; Creatinine, Ser 0.67; Hemoglobin 12.9; Platelets 468; Potassium 4.3; Sodium 142   Recent Lipid Panel Lab Results  Component Value Date/Time   CHOL 258 (H) 12/11/2021 03:19 PM   TRIG 134 12/11/2021 03:19 PM   HDL 47 12/11/2021 03:19 PM   CHOLHDL 5.5 (H) 12/11/2021 03:19 PM   CHOLHDL 5.1 (H) 08/28/2020 10:34 AM   LDLCALC 187 (H) 12/11/2021 03:19 PM   LDLCALC 184 (H) 08/28/2020 10:34 AM    Wt Readings from  Last 3 Encounters:  12/11/21 (!) 465 lb 6.4 oz (211.1 kg)  05/28/21 (!) 456 lb (206.8 kg)  03/04/21 (!) 456 lb (206.8 kg)     ASSESSMENT & PLAN:    Acute sinusitis Started Azithromycin as she has persistent symptoms despite symptomatic treatment Mucinex or Robitussin as needed for cough Tylenol as needed for fever or myalgias Continue Flonase for allergies   Time:   Today, I have spent 9 minutes reviewing the chart, including problem list, medications, and with the patient with telehealth technology discussing the above  problems.   Medication Adjustments/Labs and Tests Ordered: Current medicines are reviewed at length with the patient today.  Concerns regarding medicines are outlined above.   Tests Ordered: No orders of the defined types were placed in this encounter.   Medication Changes: No orders of the defined types were placed in this encounter.    Note: This dictation was prepared with Dragon dictation along with smaller phrase technology. Similar sounding words can be transcribed inadequately or may not be corrected upon review. Any transcriptional errors that result from this process are unintentional.      Disposition:  Follow up  Signed, Anabel Halon, MD  06/10/2022 11:58 AM     [facility]

## 2022-06-15 ENCOUNTER — Ambulatory Visit: Payer: Medicare Other | Admitting: Internal Medicine

## 2022-06-17 ENCOUNTER — Other Ambulatory Visit: Payer: Self-pay | Admitting: Internal Medicine

## 2022-06-18 MED ORDER — TRULICITY 3 MG/0.5ML ~~LOC~~ SOAJ: 3.0000 mg | SUBCUTANEOUS | 0 refills | Status: DC

## 2022-06-26 ENCOUNTER — Other Ambulatory Visit: Payer: Self-pay | Admitting: Internal Medicine

## 2022-07-06 ENCOUNTER — Ambulatory Visit: Payer: Medicare Other | Admitting: Internal Medicine

## 2022-07-14 ENCOUNTER — Encounter: Payer: Self-pay | Admitting: Internal Medicine

## 2022-07-14 ENCOUNTER — Ambulatory Visit (INDEPENDENT_AMBULATORY_CARE_PROVIDER_SITE_OTHER): Payer: Medicare Other | Admitting: Internal Medicine

## 2022-07-14 VITALS — BP 138/84 | HR 98 | Resp 18 | Ht 65.0 in | Wt >= 6400 oz

## 2022-07-14 DIAGNOSIS — E782 Mixed hyperlipidemia: Secondary | ICD-10-CM

## 2022-07-14 DIAGNOSIS — E1169 Type 2 diabetes mellitus with other specified complication: Secondary | ICD-10-CM

## 2022-07-14 DIAGNOSIS — I10 Essential (primary) hypertension: Secondary | ICD-10-CM | POA: Diagnosis not present

## 2022-07-14 DIAGNOSIS — Z2821 Immunization not carried out because of patient refusal: Secondary | ICD-10-CM | POA: Diagnosis not present

## 2022-07-14 DIAGNOSIS — M79675 Pain in left toe(s): Secondary | ICD-10-CM

## 2022-07-14 MED ORDER — REPATHA 140 MG/ML ~~LOC~~ SOSY: 140.0000 mg | PREFILLED_SYRINGE | SUBCUTANEOUS | 5 refills | Status: DC

## 2022-07-14 NOTE — Assessment & Plan Note (Addendum)
Lab Results  Component Value Date   HGBA1C 7.1 (H) 12/11/2021   Associated with morbid obesity and HTN Uncontrolled with Trulicity Needs to comply to diabetic diet On statin and ACEi Diabetic eye exam: Advised to follow up with Ophthalmology for diabetic eye exam

## 2022-07-14 NOTE — Progress Notes (Signed)
Established Patient Office Visit  Subjective:  Patient ID: Jasmin Castro, female    DOB: Sep 11, 1976  Age: 46 y.o. MRN: [id number]  CC:  Chief Complaint  Patient presents with   Follow-up    6 month follow up HTN and DM     HPI Jasmin Castro is a 46 y.o. female with past medical history of HTN, DM, morbid obesity with metabolic syndrome, HLD, asthma and depression with anxiety who presents for f/u of her chronic medical conditions.  BP is well-controlled. Takes medications regularly. Patient denies headache, dizziness, chest pain, dyspnea or palpitations.   DM: Has been taking Trulicity. Denies any polyuria or polyphagia. Her last HbA1C was 7.1 in 03/23.  HLD: She has stopped taking Crestor as she had lip swelling visit.  She had rash around neck area with atorvastatin.  Her last lipid profile showed uncontrolled cholesterol profile, with LDL at 187.  Her BMI is 76.  She has lost about 8 lbs since the last visit. Her ambulation and overall physical activity is limited due to morbid obesity.  She is currently independent with her ADLs.  She is able to drive and gets help for transportation from her family members at times.  She c/o umbilical area bleeding and itching. She has h/o umbilical hernia and was referred to General surgeon for eval, but has not been able to see them yet.   Past Medical History:  Diagnosis Date   Allergy    Anemia    Arthritis    Asthma    Depression    Diabetes mellitus without complication ([place]) 3149   GERD (gastroesophageal reflux disease)    Hyperlipemia    Hypertension    Obesity    Panic attacks    Paranoid ([place])    PCOS (polycystic ovarian syndrome)    Prediabetes    Sleep apnea    Urge incontinence 07/19/2013    Past Surgical History:  Procedure Laterality Date   HYSTEROSCOPY DIAGNOSTIC     insulin resistant      Family History  Problem Relation Age of Onset   Hyperlipidemia Mother    Arthritis Mother    Diabetes Mother     Heart disease Mother    Stroke Mother    Hyperlipidemia Father    Cancer Father        colon and prostate , brain tumor   Diabetes Sister        prediatic   Arthritis Sister    Hyperlipidemia Sister    Obesity Sister    Hyperlipidemia Brother    Hypertension Brother    Anxiety disorder Brother    Arthritis Brother    Diabetes Brother    Obesity Brother    Diabetes Sister     Social History   Socioeconomic History   Marital status: Single    Spouse name: Not on file   Number of children: Not on file   Years of education: Not on file   Highest education level: Not on file  Occupational History   Not on file  Tobacco Use   Smoking status: Never   Smokeless tobacco: Never   Tobacco comments:    Never smoked  Substance and Sexual Activity   Alcohol use: No   Drug use: No   Sexual activity: Never    Birth control/protection: None  Other Topics Concern   Not on file  Social History Narrative   Lives alone but pt states she sees family daily.   Social  Determinants of Health   Financial Resource Strain: Low Risk  (06/01/2022)   Overall Financial Resource Strain (CARDIA)    Difficulty of Paying Living Expenses: Not hard at all  Food Insecurity: No Food Insecurity (06/01/2022)   Hunger Vital Sign    Worried About Running Out of Food in the Last Year: Never true    Ran Out of Food in the Last Year: Never true  Transportation Needs: No Transportation Needs (06/01/2022)   PRAPARE - Hydrologist (Medical): No    Lack of Transportation (Non-Medical): No  Physical Activity: Inactive (06/01/2022)   Exercise Vital Sign    Days of Exercise per Week: 0 days    Minutes of Exercise per Session: 0 min  Stress: No Stress Concern Present (06/01/2022)   [place]    Feeling of Stress : Not at all  Social Connections: Socially Isolated (06/01/2022)   Social Connection and Isolation Panel  [NHANES]    Frequency of Communication with Friends and Family: Three times a week    Frequency of Social Gatherings with Friends and Family: Three times a week    Attends Religious Services: Never    Active Member of Clubs or Organizations: No    Attends Archivist Meetings: Never    Marital Status: Never married  Intimate Partner Violence: Not At Risk (06/01/2022)   Humiliation, Afraid, Rape, and Kick questionnaire    Fear of Current or Ex-Partner: No    Emotionally Abused: No    Physically Abused: No    Sexually Abused: No    Outpatient Medications Prior to Visit  Medication Sig Dispense Refill   acetaminophen (TYLENOL) 325 MG tablet Take 650 mg by mouth every 6 (six) hours as needed.     albuterol (VENTOLIN HFA) 108 (90 Base) MCG/ACT inhaler Inhale 1-2 puffs into the lungs every 6 (six) hours as needed for wheezing or shortness of breath. 8.5 g 3   Cetirizine HCl (ZYRTEC ALLERGY) 10 MG TBDP Take 10 mg by mouth at bedtime. 30 tablet 0   clotrimazole (CLOTRIMAZOLE ANTI-FUNGAL) 1 % cream Apply 1 application. topically 2 (two) times daily. To foot 30 g 0   clotrimazole-betamethasone (LOTRISONE) cream Apply 1 application. topically daily. 30 g 0   Dulaglutide (TRULICITY) 3 GY/1.7CB SOPN Inject 3 mg into the skin once a week. 8 mL 0   enalapril (VASOTEC) 20 MG tablet TAKE ONE TABLET BY MOUTH DAILY 30 tablet 5   fluticasone (FLONASE) 50 MCG/ACT nasal spray USE 2 SPRAYS IN EACH NOSTRIL ONCE DAILY. SHAKE GENTLY BEFORE USING. 16 g 6   IBU 600 MG tablet Take 600 mg by mouth 2 (two) times daily as needed.     LORazepam (ATIVAN) 1 MG tablet Take 1 mg by mouth at bedtime.     meclizine (ANTIVERT) 12.5 MG tablet TAKE ONE TABLET BY MOUTH THREE TIMES DAILY AS NEEDED FOR DIZZINESS OR NAUSEA 30 tablet 0   mupirocin ointment (BACTROBAN) 2 % APPLY TOPICALLY TWICE DAILY TO NAVAL 22 g 0   nystatin cream (MYCOSTATIN) Apply 1 application. topically 2 (two) times daily. 30 g 2   OLANZapine (ZYPREXA)  15 MG tablet Take 15 mg by mouth at bedtime.     Omega-3 Fatty Acids (FISH OIL) 1000 MG CAPS Take 1 capsule by mouth daily.     PRISTIQ 100 MG 24 hr tablet Take 100 mg by mouth at bedtime.     atorvastatin (LIPITOR) 40  MG tablet Take 1 tablet (40 mg total) by mouth daily. (Patient not taking: Reported on 07/14/2022) 90 tablet 3   benzonatate (TESSALON) 100 MG capsule Take 1 capsule (100 mg total) by mouth 2 (two) times daily as needed for cough. (Patient not taking: Reported on 07/14/2022) 20 capsule 0   HYDROcodone-acetaminophen (NORCO/VICODIN) 5-325 MG tablet Take 1 tablet by mouth every 6 (six) hours as needed. (Patient not taking: Reported on 07/14/2022) 20 tablet 0   No facility-administered medications prior to visit.    Allergies  Allergen Reactions   Crestor [Rosuvastatin] Swelling    Pt c/o swelling to lips.   Keflex [Cephalexin]     Headache    Lodine [Etodolac] Other (See Comments)    Dizziness    Seroquel [Quetiapine Fumarate]     Itching     ROS Review of Systems  Constitutional:  Negative for chills and fever.  HENT:  Negative for congestion, ear pain, sinus pressure, sinus pain and sore throat.   Eyes:  Negative for pain and discharge.  Respiratory:  Positive for shortness of breath. Negative for cough.   Cardiovascular:  Negative for chest pain and palpitations.  Gastrointestinal:  Negative for abdominal pain, diarrhea, nausea and vomiting.  Endocrine: Negative for polydipsia and polyuria.  Genitourinary:  Negative for dysuria and hematuria.  Musculoskeletal:  Negative for neck pain and neck stiffness.  Skin:  Negative for rash.  Neurological:  Negative for dizziness and weakness.  Psychiatric/Behavioral:  Negative for agitation and behavioral problems.       Objective:    Physical Exam Vitals reviewed.  Constitutional:      General: She is not in acute distress.    Appearance: She is obese. She is not diaphoretic.  HENT:     Head: Normocephalic and  atraumatic.     Nose: Nose normal.     Mouth/Throat:     Mouth: Mucous membranes are moist.  Eyes:     General: No scleral icterus.    Extraocular Movements: Extraocular movements intact.  Cardiovascular:     Rate and Rhythm: Normal rate and regular rhythm.     Pulses: Normal pulses.     Heart sounds: Normal heart sounds. No murmur heard. Pulmonary:     Breath sounds: Normal breath sounds. No wheezing or rales.  Abdominal:     Palpations: Abdomen is soft.     Tenderness: There is no abdominal tenderness.     Comments: Umbilical hernia, crusting noted  Musculoskeletal:     Cervical back: Neck supple. No tenderness.     Right lower leg: No edema.     Left lower leg: No edema.  Skin:    General: Skin is warm.     Findings: No rash.  Neurological:     General: No focal deficit present.     Mental Status: She is alert and oriented to person, place, and time.  Psychiatric:        Mood and Affect: Mood normal.        Behavior: Behavior normal.     BP 138/84 (BP Location: Left Arm, Patient Position: Sitting, Cuff Size: Normal)   Pulse 98   Resp 18   Ht _0  (1.651 m)   Wt (!) 457 lb 12.8 oz (207.7 kg)   SpO2 96%   BMI 76.18 kg/m  Wt Readings from Last 3 Encounters:  07/14/22 (!) 457 lb 12.8 oz (207.7 kg)  12/11/21 (!) 465 lb 6.4 oz (211.1 kg)  05/28/21 (!) 456  lb (206.8 kg)    Lab Results  Component Value Date   TSH 3.400 05/22/2021   Lab Results  Component Value Date   WBC 15.5 (H) 12/11/2021   HGB 12.9 12/11/2021   HCT 40.4 12/11/2021   MCV 82 12/11/2021   PLT 468 (H) 12/11/2021   Lab Results  Component Value Date   NA 142 12/11/2021   K 4.3 12/11/2021   CO2 22 12/11/2021   GLUCOSE 177 (H) 12/11/2021   BUN 8 12/11/2021   CREATININE 0.67 12/11/2021   BILITOT 0.5 12/11/2021   ALKPHOS 180 (H) 12/11/2021   AST 60 (H) 12/11/2021   ALT 50 (H) 12/11/2021   PROT 7.4 12/11/2021   ALBUMIN 4.2 12/11/2021   CALCIUM 9.9 12/11/2021   EGFR 108 12/11/2021   Lab  Results  Component Value Date   CHOL 258 (H) 12/11/2021   Lab Results  Component Value Date   HDL 47 12/11/2021   Lab Results  Component Value Date   LDLCALC 187 (H) 12/11/2021   Lab Results  Component Value Date   TRIG 134 12/11/2021   Lab Results  Component Value Date   CHOLHDL 5.5 (H) 12/11/2021   Lab Results  Component Value Date   HGBA1C 7.1 (H) 12/11/2021      Assessment & Plan:   Problem List Items Addressed This Visit       Cardiovascular and Mediastinum   Hypertension    BP Readings from Last 1 Encounters:  07/14/22 138/84  Well-controlled with Enalapril Counseled for compliance with the medications Advised DASH diet and moderate exercise/walking as tolerated      Relevant Medications   Evolocumab (REPATHA) 140 MG/ML SOSY     Endocrine   Type 2 diabetes mellitus with other specified complication (HCC) - Primary    Lab Results  Component Value Date   HGBA1C 7.1 (H) 12/11/2021  Associated with morbid obesity and HTN Uncontrolled with Trulicity Needs to comply to diabetic diet On statin and ACEi Diabetic eye exam: Advised to follow up with Ophthalmology for diabetic eye exam      Relevant Orders   CMP14+EGFR   Hemoglobin A1c   Urine Microalbumin w/creat. ratio     Other   Morbid obesity (HCC)    BMI of more than 76 impacting ambulation and overall functional status Currently independent with ADLs, drives herself and sometimes gets help from family member Understands risks of severe obesity, agrees to follow low carb diet Small, frequent meals If she loses some weight with medical supervision, would refer to Bariatric surgery. On Zyprexa, which makes it difficult to lose weight.      Mixed hyperlipidemia    Has tried Lipitor and Crestor, did not tolerate/had allergic reaction Her lipid profile show very high LDL Started Repatha      Relevant Medications   Evolocumab (REPATHA) 140 MG/ML SOSY   Other Relevant Orders   Lipid Profile    Pain of toe of left foot    Likely due to ingrown toenail Seen by Dr. Caprice Beaver Had trimming of toenails - pain now improved      Other Visit Diagnoses     Refused influenza vaccine           Meds ordered this encounter  Medications   Evolocumab (REPATHA) 140 MG/ML SOSY    Sig: Inject 140 mg into the skin every 14 (fourteen) days.    Dispense:  2 mL    Refill:  5    Follow-up: Return in  about 6 months (around 01/12/2023) for Annual physical.    Lindell Spar, MD

## 2022-07-14 NOTE — Patient Instructions (Signed)
Please continue taking medications as prescribed.  Please continue to follow low carb diet and ambulate as tolerated.  Please schedule a visit with eye doctor in your network to get diabetic eye exam.

## 2022-07-14 NOTE — Assessment & Plan Note (Signed)
BMI of more than 76 impacting ambulation and overall functional status Currently independent with ADLs, drives herself and sometimes gets help from family member Understands risks of severe obesity, agrees to follow low carb diet Small, frequent meals If she loses some weight with medical supervision, would refer to Bariatric surgery. On Zyprexa, which makes it difficult to lose weight.

## 2022-07-14 NOTE — Assessment & Plan Note (Signed)
Likely due to ingrown toenail Seen by Dr. Caprice Beaver Had trimming of toenails - pain now improved

## 2022-07-14 NOTE — Assessment & Plan Note (Signed)
Has tried Lipitor and Crestor, did not tolerate/had allergic reaction Her lipid profile show very high LDL Started Repatha

## 2022-07-14 NOTE — Assessment & Plan Note (Signed)
BP Readings from Last 1 Encounters:  07/14/22 138/84   Well-controlled with Enalapril Counseled for compliance with the medications Advised DASH diet and moderate exercise/walking as tolerated

## 2022-07-15 ENCOUNTER — Telehealth: Payer: Self-pay | Admitting: Internal Medicine

## 2022-07-15 NOTE — Telephone Encounter (Signed)
Advised pt of lab results.

## 2022-07-15 NOTE — Telephone Encounter (Signed)
Pt return call for labs  

## 2022-07-16 LAB — HEMOGLOBIN A1C
Est. average glucose Bld gHb Est-mCnc: 131 mg/dL
Hgb A1c MFr Bld: 6.2 % — ABNORMAL HIGH (ref 4.8–5.6)

## 2022-07-16 LAB — CMP14+EGFR
ALT: 23 IU/L (ref 0–32)
AST: 35 IU/L (ref 0–40)
Albumin/Globulin Ratio: 1.3 (ref 1.2–2.2)
Albumin: 4.4 g/dL (ref 3.9–4.9)
Alkaline Phosphatase: 160 IU/L — ABNORMAL HIGH (ref 44–121)
BUN/Creatinine Ratio: 11 (ref 9–23)
BUN: 7 mg/dL (ref 6–24)
Bilirubin Total: 0.5 mg/dL (ref 0.0–1.2)
CO2: 20 mmol/L (ref 20–29)
Calcium: 9.7 mg/dL (ref 8.7–10.2)
Chloride: 99 mmol/L (ref 96–106)
Creatinine, Ser: 0.63 mg/dL (ref 0.57–1.00)
Globulin, Total: 3.3 g/dL (ref 1.5–4.5)
Glucose: 122 mg/dL — ABNORMAL HIGH (ref 70–99)
Potassium: 4.1 mmol/L (ref 3.5–5.2)
Sodium: 139 mmol/L (ref 134–144)
Total Protein: 7.7 g/dL (ref 6.0–8.5)
eGFR: 109 mL/min/{1.73_m2} (ref >59)

## 2022-07-16 LAB — MICROALBUMIN / CREATININE URINE RATIO
Creatinine, Urine: 223.1 mg/dL
Microalb/Creat Ratio: 272 mg/g creat — ABNORMAL HIGH (ref 0–29)
Microalbumin, Urine: 607.8 ug/mL

## 2022-07-16 LAB — LIPID PANEL
Chol/HDL Ratio: 5.8 ratio — ABNORMAL HIGH (ref 0.0–4.4)
Cholesterol, Total: 256 mg/dL — ABNORMAL HIGH (ref 100–199)
HDL: 44 mg/dL (ref >39)
LDL Chol Calc (NIH): 190 mg/dL — ABNORMAL HIGH (ref 0–99)
Triglycerides: 122 mg/dL (ref 0–149)
VLDL Cholesterol Cal: 22 mg/dL (ref 5–40)

## 2022-07-27 DIAGNOSIS — F3130 Bipolar disorder, current episode depressed, mild or moderate severity, unspecified: Secondary | ICD-10-CM | POA: Diagnosis not present

## 2022-08-03 ENCOUNTER — Other Ambulatory Visit: Payer: Self-pay | Admitting: Internal Medicine

## 2022-08-24 DIAGNOSIS — F3130 Bipolar disorder, current episode depressed, mild or moderate severity, unspecified: Secondary | ICD-10-CM | POA: Diagnosis not present

## 2022-08-29 ENCOUNTER — Other Ambulatory Visit: Payer: Self-pay | Admitting: Internal Medicine

## 2022-08-29 DIAGNOSIS — L0882 Omphalitis not of newborn: Secondary | ICD-10-CM

## 2022-08-31 ENCOUNTER — Encounter: Payer: Self-pay | Admitting: Internal Medicine

## 2022-08-31 ENCOUNTER — Other Ambulatory Visit: Payer: Self-pay | Admitting: Internal Medicine

## 2022-08-31 ENCOUNTER — Telehealth: Payer: Self-pay | Admitting: Internal Medicine

## 2022-08-31 DIAGNOSIS — L0882 Omphalitis not of newborn: Secondary | ICD-10-CM

## 2022-08-31 MED ORDER — CLOTRIMAZOLE-BETAMETHASONE 1-0.05 % EX CREA: 1.0000 | TOPICAL_CREAM | Freq: Every day | CUTANEOUS | 0 refills | Status: DC

## 2022-08-31 MED ORDER — MUPIROCIN 2 % EX OINT: TOPICAL_OINTMENT | Freq: Two times a day (BID) | CUTANEOUS | 0 refills | Status: DC

## 2022-08-31 NOTE — Telephone Encounter (Signed)
Patient called left voicemail she has stop taking a medication that Dr Allena Katz prescribe to her she had a reaction and said will no longer take this medicne. Please give patient a call back at [phone]

## 2022-08-31 NOTE — Telephone Encounter (Signed)
No answer no voicemail. Not sure which medication

## 2022-09-01 ENCOUNTER — Telehealth: Payer: Self-pay

## 2022-09-01 NOTE — Telephone Encounter (Signed)
Patient advised.

## 2022-09-21 ENCOUNTER — Other Ambulatory Visit: Payer: Self-pay | Admitting: Internal Medicine

## 2022-09-21 MED ORDER — TRULICITY 3 MG/0.5ML ~~LOC~~ SOAJ: 3.0000 mg | SUBCUTANEOUS | 0 refills | Status: DC

## 2022-09-24 ENCOUNTER — Encounter: Payer: Self-pay | Admitting: Internal Medicine

## 2022-10-26 DIAGNOSIS — M25552 Pain in left hip: Secondary | ICD-10-CM | POA: Diagnosis not present

## 2022-10-26 DIAGNOSIS — M7062 Trochanteric bursitis, left hip: Secondary | ICD-10-CM | POA: Diagnosis not present

## 2022-11-26 ENCOUNTER — Other Ambulatory Visit: Payer: Self-pay | Admitting: Internal Medicine

## 2022-11-26 MED ORDER — TRULICITY 3 MG/0.5ML ~~LOC~~ SOAJ: 3.0000 mg | SUBCUTANEOUS | 0 refills | Status: DC

## 2022-12-02 ENCOUNTER — Other Ambulatory Visit: Payer: Self-pay | Admitting: Internal Medicine

## 2022-12-02 DIAGNOSIS — L0882 Omphalitis not of newborn: Secondary | ICD-10-CM

## 2022-12-30 ENCOUNTER — Other Ambulatory Visit: Payer: Self-pay | Admitting: Internal Medicine

## 2023-01-12 ENCOUNTER — Encounter: Payer: Medicare Other | Admitting: Internal Medicine

## 2023-01-21 ENCOUNTER — Encounter: Payer: Medicare Other | Admitting: Internal Medicine

## 2023-01-23 ENCOUNTER — Other Ambulatory Visit: Payer: Self-pay | Admitting: Internal Medicine

## 2023-01-23 DIAGNOSIS — L0882 Omphalitis not of newborn: Secondary | ICD-10-CM

## 2023-01-25 ENCOUNTER — Other Ambulatory Visit: Payer: Self-pay | Admitting: Internal Medicine

## 2023-01-25 MED ORDER — CLOTRIMAZOLE-BETAMETHASONE 1-0.05 % EX CREA: TOPICAL_CREAM | Freq: Two times a day (BID) | CUTANEOUS | 0 refills | Status: DC

## 2023-01-25 MED ORDER — MUPIROCIN 2 % EX OINT: TOPICAL_OINTMENT | Freq: Every day | CUTANEOUS | 0 refills | Status: DC

## 2023-02-18 ENCOUNTER — Encounter: Payer: Medicare Other | Admitting: Internal Medicine

## 2023-02-23 ENCOUNTER — Encounter: Payer: Self-pay | Admitting: Internal Medicine

## 2023-02-23 ENCOUNTER — Telehealth (INDEPENDENT_AMBULATORY_CARE_PROVIDER_SITE_OTHER): Payer: Medicare Other | Admitting: Internal Medicine

## 2023-02-23 DIAGNOSIS — K529 Noninfective gastroenteritis and colitis, unspecified: Secondary | ICD-10-CM

## 2023-02-23 MED ORDER — AZITHROMYCIN 500 MG PO TABS: 500.0000 mg | ORAL_TABLET | Freq: Every day | ORAL | 0 refills | Status: DC

## 2023-02-23 MED ORDER — ONDANSETRON HCL 4 MG PO TABS: 4.0000 mg | ORAL_TABLET | Freq: Three times a day (TID) | ORAL | 0 refills | Status: AC | PRN

## 2023-02-23 NOTE — Patient Instructions (Signed)
Please get stool test done before starting antibiotic.  Please maintain at least 64 ounces of fluid intake and take additional 250 ml for each episode of vomiting or diarrhea.

## 2023-02-23 NOTE — Progress Notes (Signed)
Virtual Visit via Video Note   Because of Jasmin Castro's co-morbid illnesses, she is at least at moderate risk for complications without adequate follow up.  This format is felt to be most appropriate for this patient at this time.  All issues noted in this document were discussed and addressed.  A limited physical exam was performed with this format.      Evaluation Performed:  Follow-up visit  Date:  02/23/2023   ID:  Jasmin Castro, DOB [birth year], MRN [id number]  Patient Location: Home Provider Location: Office/Clinic  Participants: Patient Location of Patient: Home Location of Provider: Telehealth Consent was obtain for visit to be over via telehealth. I verified that I am speaking with the correct person using two identifiers.  PCP:  Anabel Halon, MD   Chief Complaint: Diarrhea, nausea and vomiting  History of Present Illness:    Jasmin Castro is a 47 y.o. female who has a video visit for complaint of watery diarrhea, abdominal cramping, nausea and vomiting for the last 2 weeks.  She reports eating at [facility] prior to her symptoms.  She denies any melena or hematochezia currently.  She has tried to maintain fluid intake.she feels fatigued, but denies dizziness, dyspnea currently.  The patient does not have symptoms concerning for COVID-19 infection (fever, chills, cough, or new shortness of breath).   Past Medical, Surgical, Social History, Allergies, and Medications have been Reviewed.  Past Medical History:  Diagnosis Date   Allergy    Anemia    Arthritis    Asthma    Depression    Diabetes mellitus without complication (HCC) 2021   GERD (gastroesophageal reflux disease)    Hyperlipemia    Hypertension    Obesity    Panic attacks    Paranoid (HCC)    PCOS (polycystic ovarian syndrome)    Prediabetes    Sleep apnea    Urge incontinence 07/19/2013   Past Surgical History:  Procedure Laterality Date   HYSTEROSCOPY DIAGNOSTIC     insulin resistant        Current Meds  Medication Sig   azithromycin (ZITHROMAX) 500 MG tablet Take 1 tablet (500 mg total) by mouth daily.   ondansetron (ZOFRAN) 4 MG tablet Take 1 tablet (4 mg total) by mouth every 8 (eight) hours as needed for nausea or vomiting.     Allergies:   Statins, Crestor [rosuvastatin], Keflex [cephalexin], Lodine [etodolac], and Seroquel [quetiapine fumarate]   ROS:   Please see the history of present illness.     All other systems reviewed and are negative.   Labs/Other Tests and Data Reviewed:    Recent Labs: 07/14/2022: ALT 23; BUN 7; Creatinine, Ser 0.63; Potassium 4.1; Sodium 139   Recent Lipid Panel Lab Results  Component Value Date/Time   CHOL 256 (H) 07/14/2022 02:08 PM   TRIG 122 07/14/2022 02:08 PM   HDL 44 07/14/2022 02:08 PM   CHOLHDL 5.8 (H) 07/14/2022 02:08 PM   CHOLHDL 5.1 (H) 08/28/2020 10:34 AM   LDLCALC 190 (H) 07/14/2022 02:08 PM   LDLCALC 184 (H) 08/28/2020 10:34 AM    Wt Readings from Last 3 Encounters:  07/14/22 (!) 457 lb 12.8 oz (207.7 kg)  12/11/21 (!) 465 lb 6.4 oz (211.1 kg)  05/28/21 (!) 456 lb (206.8 kg)    Objective:    Vital Signs:  There were no vitals taken for this visit.   VITAL SIGNS:  reviewed GEN:  no acute distress EYES:  sclerae anicteric, EOMI - Extraocular Movements Intact RESPIRATORY:  normal respiratory effort, symmetric expansion NEURO:  alert and oriented x 3, no obvious focal deficit PSYCH:  normal affect  ASSESSMENT & PLAN:    Acute gastroenteritis Her symptoms are consistent with acute bacterial gastroenteritis Most gastroenteritis are self resolving, but her symptoms have persisted more than a week now, started empiric azithromycin 500 mg QD X 3 days Advised to get GI stool profile before starting antibiotic Zofran as needed for nausea/vomiting Maintain adequate hydration Advised to go to ER if she has worsening of symptoms, dizziness or dyspnea  I discussed the assessment and treatment plan with  the patient. The patient was provided an opportunity to ask questions, and all were answered. The patient agreed with the plan and demonstrated an understanding of the instructions.   The patient was advised to call back or seek an in-person evaluation if the symptoms worsen or if the condition fails to improve as anticipated.  The above assessment and management plan was discussed with the patient. The patient verbalized understanding of and has agreed to the management plan.   Medication Adjustments/Labs and Tests Ordered: Current medicines are reviewed at length with the patient today.  Concerns regarding medicines are outlined above.   Tests Ordered: No orders of the defined types were placed in this encounter.   Medication Changes: Meds ordered this encounter  Medications   azithromycin (ZITHROMAX) 500 MG tablet    Sig: Take 1 tablet (500 mg total) by mouth daily.    Dispense:  3 tablet    Refill:  0   ondansetron (ZOFRAN) 4 MG tablet    Sig: Take 1 tablet (4 mg total) by mouth every 8 (eight) hours as needed for nausea or vomiting.    Dispense:  20 tablet    Refill:  0     Note: This dictation was prepared with Dragon dictation along with smaller phrase technology. Similar sounding words can be transcribed inadequately or may not be corrected upon review. Any transcriptional errors that result from this process are unintentional.      Disposition:  Follow up  Signed, Anabel Halon, MD  02/23/2023 2:59 PM     [facility]

## 2023-02-26 ENCOUNTER — Other Ambulatory Visit: Payer: Self-pay

## 2023-02-26 ENCOUNTER — Telehealth: Payer: Self-pay | Admitting: Internal Medicine

## 2023-02-26 MED ORDER — ENALAPRIL MALEATE 20 MG PO TABS: 20.0000 mg | ORAL_TABLET | Freq: Every day | ORAL | 2 refills | Status: DC

## 2023-02-26 NOTE — Telephone Encounter (Signed)
Refills sent

## 2023-02-26 NOTE — Telephone Encounter (Signed)
Prescription Request  02/26/2023  LOV: 07/14/2022  What is the name of the medication or equipment? enalapril (VASOTEC) 20 MG tablet   Have you contacted your pharmacy to request a refill? Yes   Which pharmacy would you like this sent to?    [facility] [id number] - [place], [place] - [address] AT [facility] [address] [place] [address] Phone: [phone] Fax: [phone]    Patient notified that their request is being sent to the clinical staff for review and that they should receive a response within 2 business days.   Please advise at [facility] [phone]    Pharm states that med dis not have Directions attached

## 2023-03-04 DIAGNOSIS — K529 Noninfective gastroenteritis and colitis, unspecified: Secondary | ICD-10-CM | POA: Diagnosis not present

## 2023-03-07 LAB — GI PROFILE, STOOL, PCR

## 2023-03-08 ENCOUNTER — Encounter: Payer: Medicare Other | Admitting: Internal Medicine

## 2023-04-01 DIAGNOSIS — M79674 Pain in right toe(s): Secondary | ICD-10-CM | POA: Diagnosis not present

## 2023-04-01 DIAGNOSIS — L600 Ingrowing nail: Secondary | ICD-10-CM | POA: Diagnosis not present

## 2023-04-01 DIAGNOSIS — L03031 Cellulitis of right toe: Secondary | ICD-10-CM | POA: Diagnosis not present

## 2023-04-20 DIAGNOSIS — L929 Granulomatous disorder of the skin and subcutaneous tissue, unspecified: Secondary | ICD-10-CM | POA: Diagnosis not present

## 2023-04-21 ENCOUNTER — Encounter: Payer: Medicare Other | Admitting: Internal Medicine

## 2023-05-13 ENCOUNTER — Encounter: Payer: Self-pay | Admitting: Internal Medicine

## 2023-05-13 ENCOUNTER — Ambulatory Visit: Payer: Medicare Other | Admitting: Internal Medicine

## 2023-05-13 VITALS — BP 144/86 | HR 98 | Ht 65.0 in | Wt >= 6400 oz

## 2023-05-13 DIAGNOSIS — Z7985 Long-term (current) use of injectable non-insulin antidiabetic drugs: Secondary | ICD-10-CM

## 2023-05-13 DIAGNOSIS — Z2821 Immunization not carried out because of patient refusal: Secondary | ICD-10-CM | POA: Diagnosis not present

## 2023-05-13 DIAGNOSIS — E1169 Type 2 diabetes mellitus with other specified complication: Secondary | ICD-10-CM

## 2023-05-13 DIAGNOSIS — E785 Hyperlipidemia, unspecified: Secondary | ICD-10-CM | POA: Diagnosis not present

## 2023-05-13 DIAGNOSIS — I10 Essential (primary) hypertension: Secondary | ICD-10-CM

## 2023-05-13 DIAGNOSIS — E559 Vitamin D deficiency, unspecified: Secondary | ICD-10-CM

## 2023-05-13 MED ORDER — PRALUENT 150 MG/ML ~~LOC~~ SOAJ
150.0000 mg | SUBCUTANEOUS | 2 refills | Status: DC
Start: 2023-05-13 — End: 2023-09-03

## 2023-05-13 MED ORDER — AMLODIPINE BESYLATE 5 MG PO TABS
5.0000 mg | ORAL_TABLET | Freq: Every day | ORAL | 1 refills | Status: DC
Start: 2023-05-13 — End: 2023-05-13

## 2023-05-13 MED ORDER — TIRZEPATIDE 5 MG/0.5ML ~~LOC~~ SOAJ
5.0000 mg | SUBCUTANEOUS | 1 refills | Status: DC
Start: 2023-05-13 — End: 2023-05-18

## 2023-05-13 MED ORDER — AMLODIPINE BESYLATE 5 MG PO TABS: 5.0000 mg | ORAL_TABLET | Freq: Every day | ORAL | 1 refills | Status: DC

## 2023-05-13 NOTE — Assessment & Plan Note (Addendum)
Part of metabolic syndrome Her LDL has been up to 190 Did not tolerate multiple different statins or had an allergic reaction Had facial itching with Repatha She needs lipid lowering agent due to her history of type II DM and HTN to reduce risk of CVA and CAD-started Praluent

## 2023-05-13 NOTE — Assessment & Plan Note (Signed)
Lab Results  Component Value Date   HGBA1C 6.2 (H) 07/14/2022   Associated with morbid obesity and HTN Well-controlled with Trulicity Switched to [facility] for better weight loss benefit Needs to comply to diabetic diet On statin and ACEi Diabetic eye exam: Advised to follow up with Ophthalmology for diabetic eye exam

## 2023-05-13 NOTE — Assessment & Plan Note (Addendum)
BP Readings from Last 1 Encounters:  05/13/23 (!) 144/86   Uncontrolled with Enalapril 20 mg QD Added amlodipine 5 mg QD Counseled for compliance with the medications Advised DASH diet and moderate exercise/walking as tolerated

## 2023-05-13 NOTE — Patient Instructions (Addendum)
Please start taking Amlodipine 5 mg once daily in addition to Enalapril 20 mg once daily for blood pressure.  Please start taking Mounjaro 5 mg once weekly instead of Trulicity once you complete current supply of Trulicity.  Please start taking Praluent once every 2 weeks for cholesterol. Please take Benadryl on the day of dose of Praluent.  Please continue to take other medications as prescribed.  Please continue to follow low carb diet and perform moderate exercise/walking at least 150 mins/week.  Please consider getting Tdap vaccine at local pharmacy.

## 2023-05-13 NOTE — Progress Notes (Signed)
Established Patient Office Visit  Subjective:  Patient ID: Jasmin Castro, female    DOB: [birth year]  Age: 47 y.o. MRN: [id number]  CC:  Chief Complaint  Patient presents with   Annual Exam    Follow up  Concern- Cholesterol     HPI Jasmin Castro is a 47 y.o. female with past medical history of HTN, DM, morbid obesity with metabolic syndrome, HLD, asthma and depression with anxiety who presents for f/u of her chronic medical conditions.  She has not followed up since 10/23.  BP is elevated today. Takes enalapril 20 mg QD regularly. Patient denies headache, dizziness, chest pain, dyspnea or palpitations.  She admits that she has to eat fast food on most of the days, which leads to elevated blood pressure.   DM: Has been taking Trulicity 3 mg qw. Denies any polyuria or polyphagia. Her last HbA1C was 6.2 in 10/23.  HLD: She has had allergic reaction to statins.  She was prescribed Repatha in the last visit, but had itching over face area due to it.  Her last lipid profile showed uncontrolled cholesterol profile, with LDL at 187.  Her BMI is 76.  She has gained about 4 lbs since the last visit. Her ambulation and overall physical activity is limited due to morbid obesity.  She is currently independent with her ADLs.  She is able to drive and gets help for transportation from her family members at times.   Past Medical History:  Diagnosis Date   Allergy    Anemia    Arthritis    Asthma    Depression    Diabetes mellitus without complication (HCC) 2021   GERD (gastroesophageal reflux disease)    Hyperlipemia    Hypertension    Obesity    Panic attacks    Paranoid (HCC)    PCOS (polycystic ovarian syndrome)    Prediabetes    Sleep apnea    Urge incontinence 07/19/2013    Past Surgical History:  Procedure Laterality Date   HYSTEROSCOPY DIAGNOSTIC     insulin resistant      Family History  Problem Relation Age of Onset   Hyperlipidemia Mother    Arthritis Mother     Diabetes Mother    Heart disease Mother    Stroke Mother    Hyperlipidemia Father    Cancer Father        colon and prostate , brain tumor   Diabetes Sister        prediatic   Arthritis Sister    Hyperlipidemia Sister    Obesity Sister    Hyperlipidemia Brother    Hypertension Brother    Anxiety disorder Brother    Arthritis Brother    Diabetes Brother    Obesity Brother    Diabetes Sister     Social History   Socioeconomic History   Marital status: Single    Spouse name: Not on file   Number of children: Not on file   Years of education: Not on file   Highest education level: Not on file  Occupational History   Not on file  Tobacco Use   Smoking status: Never   Smokeless tobacco: Never   Tobacco comments:    Never smoked  Substance and Sexual Activity   Alcohol use: No   Drug use: No   Sexual activity: Never    Birth control/protection: None  Other Topics Concern   Not on file  Social History Narrative   Lives alone  but pt states she sees family daily.   Social Determinants of Health   Financial Resource Strain: Low Risk  (06/01/2022)   Overall Financial Resource Strain (CARDIA)    Difficulty of Paying Living Expenses: Not hard at all  Food Insecurity: No Food Insecurity (06/01/2022)   Hunger Vital Sign    Worried About Running Out of Food in the Last Year: Never true    Ran Out of Food in the Last Year: Never true  Transportation Needs: No Transportation Needs (06/01/2022)   PRAPARE - Administrator, Civil Service (Medical): No    Lack of Transportation (Non-Medical): No  Physical Activity: Inactive (06/01/2022)   Exercise Vital Sign    Days of Exercise per Week: 0 days    Minutes of Exercise per Session: 0 min  Stress: No Stress Concern Present (06/01/2022)   [employer] of Occupational Health - Occupational Stress Questionnaire    Feeling of Stress : Not at all  Social Connections: Socially Isolated (06/01/2022)   Social Connection and  Isolation Panel [NHANES]    Frequency of Communication with Friends and Family: Three times a week    Frequency of Social Gatherings with Friends and Family: Three times a week    Attends Religious Services: Never    Active Member of Clubs or Organizations: No    Attends Banker Meetings: Never    Marital Status: Never married  Intimate Partner Violence: Not At Risk (06/01/2022)   Humiliation, Afraid, Rape, and Kick questionnaire    Fear of Current or Ex-Partner: No    Emotionally Abused: No    Physically Abused: No    Sexually Abused: No    Outpatient Medications Prior to Visit  Medication Sig Dispense Refill   acetaminophen (TYLENOL) 325 MG tablet Take 650 mg by mouth every 6 (six) hours as needed.     albuterol (VENTOLIN HFA) 108 (90 Base) MCG/ACT inhaler Inhale 1-2 puffs into the lungs every 6 (six) hours as needed for wheezing or shortness of breath. 8.5 g 3   Cetirizine HCl (ZYRTEC ALLERGY) 10 MG TBDP Take 10 mg by mouth at bedtime. 30 tablet 0   clotrimazole-betamethasone (LOTRISONE) cream Apply topically 2 (two) times daily. 60 g 0   enalapril (VASOTEC) 20 MG tablet Take 1 tablet (20 mg total) by mouth daily. 30 tablet 2   fluticasone (FLONASE) 50 MCG/ACT nasal spray USE 2 SPRAYS IN EACH NOSTRIL ONCE DAILY.  SHAKE GENTLY BEFORE USING. 16 g 6   IBU 600 MG tablet Take 600 mg by mouth 2 (two) times daily as needed.     LORazepam (ATIVAN) 1 MG tablet Take 1 mg by mouth at bedtime.     meclizine (ANTIVERT) 12.5 MG tablet TAKE ONE TABLET BY MOUTH THREE TIMES DAILY AS NEEDED FOR DIZZINESS OR NAUSEA 30 tablet 0   mupirocin ointment (BACTROBAN) 2 % Apply topically daily. 22 g 0   nystatin cream (MYCOSTATIN) APPLY ONE APPLICATION TOPICALLY TWICE DAILY 30 g 2   OLANZapine (ZYPREXA) 15 MG tablet Take 15 mg by mouth at bedtime.     Omega-3 Fatty Acids (FISH OIL) 1000 MG CAPS Take 1 capsule by mouth daily.     ondansetron (ZOFRAN) 4 MG tablet Take 1 tablet (4 mg total) by mouth  every 8 (eight) hours as needed for nausea or vomiting. 20 tablet 0   PRISTIQ 100 MG 24 hr tablet Take 100 mg by mouth at bedtime.     azithromycin (ZITHROMAX) 500 MG tablet  Take 1 tablet (500 mg total) by mouth daily. 3 tablet 0   Dulaglutide (TRULICITY) 3 MG/0.5ML SOPN Inject 3 mg into the skin once a week. 8 mL 0   Evolocumab (REPATHA) 140 MG/ML SOSY Inject 140 mg into the skin every 14 (fourteen) days. (Patient not taking: Reported on 05/13/2023) 2 mL 5   No facility-administered medications prior to visit.    Allergies  Allergen Reactions   Statins Swelling   Crestor [Rosuvastatin] Swelling    Pt c/o swelling to lips.   Keflex [Cephalexin]     Headache    Lodine [Etodolac] Other (See Comments)    Dizziness    Seroquel [Quetiapine Fumarate]     Itching     ROS Review of Systems  Constitutional:  Negative for chills and fever.  HENT:  Negative for congestion, ear pain, sinus pressure, sinus pain and sore throat.   Eyes:  Negative for pain and discharge.  Respiratory:  Positive for shortness of breath. Negative for cough.   Cardiovascular:  Negative for chest pain and palpitations.  Gastrointestinal:  Negative for abdominal pain, diarrhea, nausea and vomiting.  Endocrine: Negative for polydipsia and polyuria.  Genitourinary:  Negative for dysuria and hematuria.  Musculoskeletal:  Negative for neck pain and neck stiffness.  Skin:  Negative for rash.  Neurological:  Negative for dizziness and weakness.  Psychiatric/Behavioral:  Negative for agitation and behavioral problems.       Objective:    Physical Exam Vitals reviewed.  Constitutional:      General: She is not in acute distress.    Appearance: She is obese. She is not diaphoretic.     Comments: Has rolling walker  HENT:     Head: Normocephalic and atraumatic.     Nose: Nose normal.     Mouth/Throat:     Mouth: Mucous membranes are moist.  Eyes:     General: No scleral icterus.    Extraocular Movements:  Extraocular movements intact.  Cardiovascular:     Rate and Rhythm: Normal rate and regular rhythm.     Pulses: Normal pulses.     Heart sounds: Normal heart sounds. No murmur heard. Pulmonary:     Breath sounds: Normal breath sounds. No wheezing or rales.  Abdominal:     Palpations: Abdomen is soft.     Tenderness: There is no abdominal tenderness.     Comments: Umbilical hernia  Musculoskeletal:     Cervical back: Neck supple. No tenderness.     Right lower leg: No edema.     Left lower leg: No edema.  Skin:    General: Skin is warm.     Findings: No rash.  Neurological:     General: No focal deficit present.     Mental Status: She is alert and oriented to person, place, and time.  Psychiatric:        Mood and Affect: Mood normal.        Behavior: Behavior normal.     BP (!) 144/86 (BP Location: Left Arm)   Pulse 98   Ht 5\' 5"  (1.651 m)   Wt (!) 461 lb (209.1 kg)   SpO2 95%   BMI 76.71 kg/m  Wt Readings from Last 3 Encounters:  05/13/23 (!) 461 lb (209.1 kg)  07/14/22 (!) 457 lb 12.8 oz (207.7 kg)  12/11/21 (!) 465 lb 6.4 oz (211.1 kg)    Lab Results  Component Value Date   TSH 3.400 05/22/2021   Lab Results  Component  Value Date   WBC 15.5 (H) 12/11/2021   HGB 12.9 12/11/2021   HCT 40.4 12/11/2021   MCV 82 12/11/2021   PLT 468 (H) 12/11/2021   Lab Results  Component Value Date   NA 139 07/14/2022   K 4.1 07/14/2022   CO2 20 07/14/2022   GLUCOSE 122 (H) 07/14/2022   BUN 7 07/14/2022   CREATININE 0.63 07/14/2022   BILITOT 0.5 07/14/2022   ALKPHOS 160 (H) 07/14/2022   AST 35 07/14/2022   ALT 23 07/14/2022   PROT 7.7 07/14/2022   ALBUMIN 4.4 07/14/2022   CALCIUM 9.7 07/14/2022   EGFR 109 07/14/2022   Lab Results  Component Value Date   CHOL 256 (H) 07/14/2022   Lab Results  Component Value Date   HDL 44 07/14/2022   Lab Results  Component Value Date   LDLCALC 190 (H) 07/14/2022   Lab Results  Component Value Date   TRIG 122 07/14/2022    Lab Results  Component Value Date   CHOLHDL 5.8 (H) 07/14/2022   Lab Results  Component Value Date   HGBA1C 6.2 (H) 07/14/2022      Assessment & Plan:   Problem List Items Addressed This Visit       Cardiovascular and Mediastinum   Hypertension - Primary    BP Readings from Last 1 Encounters:  05/13/23 (!) 144/86   Uncontrolled with Enalapril 20 mg QD Added amlodipine 5 mg QD Counseled for compliance with the medications Advised DASH diet and moderate exercise/walking as tolerated      Relevant Medications   Alirocumab (PRALUENT) 150 MG/ML SOAJ   amLODipine (NORVASC) 5 MG tablet   Other Relevant Orders   TSH   CMP14+EGFR   CBC with Differential/Platelet     Endocrine   Type 2 diabetes mellitus with other specified complication (HCC)    Lab Results  Component Value Date   HGBA1C 6.2 (H) 07/14/2022   Associated with morbid obesity and HTN Well-controlled with Trulicity Switched to [facility] for better weight loss benefit Needs to comply to diabetic diet On statin and ACEi Diabetic eye exam: Advised to follow up with Ophthalmology for diabetic eye exam      Relevant Medications   tirzepatide (MOUNJARO) 5 MG/0.5ML Pen   Other Relevant Orders   Hemoglobin A1c   CMP14+EGFR   Urine Microalbumin w/creat. ratio   Hyperlipidemia associated with type 2 diabetes mellitus (HCC)    Part of metabolic syndrome Her LDL has been up to 190 Did not tolerate multiple different statins or had an allergic reaction Had facial itching with Repatha She needs lipid lowering agent due to her history of type II DM and HTN to reduce risk of CVA and CAD-started Praluent      Relevant Medications   Alirocumab (PRALUENT) 150 MG/ML SOAJ   tirzepatide (MOUNJARO) 5 MG/0.5ML Pen   amLODipine (NORVASC) 5 MG tablet   Other Relevant Orders   Lipid panel     Other   Morbid obesity (HCC)    BMI of more than 76 impacting ambulation and overall functional status Currently independent  with ADLs, drives herself and sometimes gets help from family member Understands risks of severe obesity, agrees to follow low carb diet Small, frequent meals If she loses some weight with medical supervision, would refer to Bariatric surgery. On Zyprexa, which makes it difficult to lose weight.      Relevant Medications   tirzepatide (MOUNJARO) 5 MG/0.5ML Pen   Other Visit Diagnoses  Vitamin D deficiency       Relevant Orders   VITAMIN D 25 Hydroxy (Vit-D Deficiency, Fractures)       Meds ordered this encounter  Medications   Alirocumab (PRALUENT) 150 MG/ML SOAJ    Sig: Inject 1 mL (150 mg total) into the skin every 14 (fourteen) days.    Dispense:  2 mL    Refill:  2   tirzepatide (MOUNJARO) 5 MG/0.5ML Pen    Sig: Inject 5 mg into the skin once a week.    Dispense:  6 mL    Refill:  1   DISCONTD: amLODipine (NORVASC) 5 MG tablet    Sig: Take 1 tablet (5 mg total) by mouth daily.    Dispense:  90 tablet    Refill:  1   amLODipine (NORVASC) 5 MG tablet    Sig: Take 1 tablet (5 mg total) by mouth daily.    Dispense:  90 tablet    Refill:  1    Follow-up: Return in about 3 months (around 08/13/2023) for DM and HTN.    Anabel Halon, MD

## 2023-05-13 NOTE — Assessment & Plan Note (Signed)
BMI of more than 76 impacting ambulation and overall functional status Currently independent with ADLs, drives herself and sometimes gets help from family member Understands risks of severe obesity, agrees to follow low carb diet Small, frequent meals If she loses some weight with medical supervision, would refer to Bariatric surgery. On Zyprexa, which makes it difficult to lose weight.

## 2023-05-14 ENCOUNTER — Other Ambulatory Visit: Payer: Self-pay | Admitting: Internal Medicine

## 2023-05-14 ENCOUNTER — Telehealth: Payer: Self-pay | Admitting: Internal Medicine

## 2023-05-14 DIAGNOSIS — E559 Vitamin D deficiency, unspecified: Secondary | ICD-10-CM | POA: Insufficient documentation

## 2023-05-14 LAB — CMP14+EGFR
ALT: 20 IU/L (ref 0–32)
AST: 26 IU/L (ref 0–40)
Albumin: 4.2 g/dL (ref 3.9–4.9)
Alkaline Phosphatase: 170 IU/L — ABNORMAL HIGH (ref 44–121)
BUN/Creatinine Ratio: 9 (ref 9–23)
BUN: 6 mg/dL (ref 6–24)
Bilirubin Total: 0.5 mg/dL (ref 0.0–1.2)
CO2: 22 mmol/L (ref 20–29)
Calcium: 9.9 mg/dL (ref 8.7–10.2)
Chloride: 100 mmol/L (ref 96–106)
Creatinine, Ser: 0.69 mg/dL (ref 0.57–1.00)
Globulin, Total: 3.2 g/dL (ref 1.5–4.5)
Glucose: 115 mg/dL — ABNORMAL HIGH (ref 70–99)
Potassium: 4.4 mmol/L (ref 3.5–5.2)
Sodium: 141 mmol/L (ref 134–144)
Total Protein: 7.4 g/dL (ref 6.0–8.5)
eGFR: 106 mL/min/{1.73_m2} (ref >59)

## 2023-05-14 LAB — LIPID PANEL
Chol/HDL Ratio: 5 ratio — ABNORMAL HIGH (ref 0.0–4.4)
Cholesterol, Total: 243 mg/dL — ABNORMAL HIGH (ref 100–199)
HDL: 49 mg/dL (ref >39)
LDL Chol Calc (NIH): 177 mg/dL — ABNORMAL HIGH (ref 0–99)
Triglycerides: 97 mg/dL (ref 0–149)
VLDL Cholesterol Cal: 17 mg/dL (ref 5–40)

## 2023-05-14 LAB — CBC WITH DIFFERENTIAL/PLATELET
Basophils Absolute: 0.1 10*3/uL (ref 0.0–0.2)
Basos: 1 %
EOS (ABSOLUTE): 0.1 10*3/uL (ref 0.0–0.4)
Eos: 1 %
Hematocrit: 42 % (ref 34.0–46.6)
Hemoglobin: 13.1 g/dL (ref 11.1–15.9)
Immature Grans (Abs): 0.1 10*3/uL (ref 0.0–0.1)
Immature Granulocytes: 1 %
Lymphocytes Absolute: 3.5 10*3/uL — ABNORMAL HIGH (ref 0.7–3.1)
Lymphs: 23 %
MCH: 27.2 pg (ref 26.6–33.0)
MCHC: 31.2 g/dL — ABNORMAL LOW (ref 31.5–35.7)
MCV: 87 fL (ref 79–97)
Monocytes Absolute: 1 10*3/uL — ABNORMAL HIGH (ref 0.1–0.9)
Monocytes: 7 %
Neutrophils Absolute: 10.3 10*3/uL — ABNORMAL HIGH (ref 1.4–7.0)
Neutrophils: 67 %
Platelets: 475 10*3/uL — ABNORMAL HIGH (ref 150–450)
RBC: 4.81 x10E6/uL (ref 3.77–5.28)
RDW: 13.2 % (ref 11.7–15.4)
WBC: 15.1 10*3/uL — ABNORMAL HIGH (ref 3.4–10.8)

## 2023-05-14 LAB — VITAMIN D 25 HYDROXY (VIT D DEFICIENCY, FRACTURES): Vit D, 25-Hydroxy: 8.1 ng/mL — ABNORMAL LOW (ref 30.0–100.0)

## 2023-05-14 LAB — TSH: TSH: 1.88 u[IU]/mL (ref 0.450–4.500)

## 2023-05-14 LAB — HEMOGLOBIN A1C
Est. average glucose Bld gHb Est-mCnc: 128 mg/dL
Hgb A1c MFr Bld: 6.1 % — ABNORMAL HIGH (ref 4.8–5.6)

## 2023-05-14 MED ORDER — VITAMIN D (ERGOCALCIFEROL) 1.25 MG (50000 UNIT) PO CAPS
50000.0000 [IU] | ORAL_CAPSULE | ORAL | 1 refills | Status: DC
Start: 2023-05-14 — End: 2023-08-19

## 2023-05-14 NOTE — Telephone Encounter (Signed)
Jasmin Castro called in on patient behalf  Checking status on pre auth for Jasmin Castro (PRALUENT) 150 MG/ML SOAJ [[id number]]    Call back info [phone]

## 2023-05-18 ENCOUNTER — Telehealth: Payer: Self-pay | Admitting: Internal Medicine

## 2023-05-18 ENCOUNTER — Other Ambulatory Visit: Payer: Self-pay | Admitting: Internal Medicine

## 2023-05-18 DIAGNOSIS — E1169 Type 2 diabetes mellitus with other specified complication: Secondary | ICD-10-CM

## 2023-05-18 MED ORDER — SEMAGLUTIDE (1 MG/DOSE) 4 MG/3ML ~~LOC~~ SOPN: 1.0000 mg | PEN_INJECTOR | SUBCUTANEOUS | 3 refills | Status: DC

## 2023-05-18 NOTE — Telephone Encounter (Signed)
Patient called said her insurance does not cover the monujaro  but does the Trulicity  do you ant her to take the Trulicity . Call back # [phone]

## 2023-05-18 NOTE — Telephone Encounter (Signed)
[facility] special[facility] insurance called in on patient behalf   Needs update on status of pre auth for patient Alirocumab (PRALUENT) 150 MG/ML SOAJ [[id number]]  Call back [phone]

## 2023-05-19 NOTE — Telephone Encounter (Signed)
Patient advised.

## 2023-05-21 ENCOUNTER — Telehealth: Payer: Self-pay | Admitting: Internal Medicine

## 2023-05-21 NOTE — Telephone Encounter (Signed)
Patient LVM checking on prior auth for cholesterol Rx. Please advise Thank you

## 2023-05-24 ENCOUNTER — Other Ambulatory Visit: Payer: Self-pay | Admitting: Internal Medicine

## 2023-05-24 NOTE — Telephone Encounter (Signed)
Mychart message sent to patient to have pharmacy start PA

## 2023-05-25 NOTE — Telephone Encounter (Signed)
Waiting on pharmacy to start a pre auth.

## 2023-06-07 ENCOUNTER — Ambulatory Visit (INDEPENDENT_AMBULATORY_CARE_PROVIDER_SITE_OTHER): Payer: Medicare Other

## 2023-06-07 VITALS — Ht 65.0 in | Wt >= 6400 oz

## 2023-06-07 DIAGNOSIS — E1169 Type 2 diabetes mellitus with other specified complication: Secondary | ICD-10-CM

## 2023-06-07 DIAGNOSIS — Z0100 Encounter for examination of eyes and vision without abnormal findings: Secondary | ICD-10-CM

## 2023-06-07 DIAGNOSIS — Z0000 Encounter for general adult medical examination without abnormal findings: Secondary | ICD-10-CM | POA: Diagnosis not present

## 2023-06-07 NOTE — Patient Instructions (Addendum)
Jasmin Castro , Thank you for taking time to come for your Medicare Wellness Visit. I appreciate your ongoing commitment to your health goals. Please review the following plan we discussed and let me know if I can assist you in the future.   Referrals/Orders/Follow-Ups/Clinician Recommendations:  AWV follow up: 1 year August 09, 2024 at 12:30pm  You have been referred to [facility] for an eye exam. If you haven't heard from them in the next 7-10 business days, please call their office to schedule your appointment  [facility] [place] [address] [place], [place] [address] Phone: [phone]  Exercises to do While Sitting  Exercises that you do while sitting (chair exercises) can give you many of the same benefits as full exercise. Benefits include strengthening your heart, burning calories, and keeping muscles and joints healthy. Exercise can also improve your mood and help with depression and anxiety. You may benefit from chair exercises if you are unable to do standing exercises due to: Diabetic foot pain. Obesity. Illness. Arthritis. Recovery from surgery or injury. Breathing problems. Balance problems. Another type of disability. Before starting chair exercises, check with your health care provider or a physical therapist to find out how much exercise you can tolerate and which exercises are safe for you. If your health care provider approves: Start out slowly and build up over time. Aim to work up to about 10-20 minutes for each exercise session. Make exercise part of your daily routine. Drink water when you exercise. Do not wait until you are thirsty. Drink every 10-15 minutes. Stop exercising right away if you have pain, nausea, shortness of breath, or dizziness. If you are exercising in a wheelchair, make sure to lock the wheels. Ask your health care provider whether you can do tai chi or yoga. Many positions in these mind-body exercises can be modified to do while  seated. Warm-up Before starting other exercises: Sit up as straight as you can. Have your knees bent at 90 degrees, which is the shape of the capital letter "L." Keep your feet flat on the floor. Sit at the front edge of your chair, if you can. Pull in (tighten) the muscles in your abdomen and stretch your spine and neck as straight as you can. Hold this position for a few minutes. Breathe in and out evenly. Try to concentrate on your breathing, and relax your mind. Stretching Exercise A: Arm stretch Hold your arms out straight in front of your body. Bend your hands at the wrist with your fingers pointing up, as if signaling someone to stop. Notice the slight tension in your forearms as you hold the position. Keeping your arms out and your hands bent, rotate your hands outward as far as you can and hold this stretch. Aim to have your thumbs pointing up and your pinkie fingers pointing down. Slowly repeat arm stretches for one minute as tolerated. Exercise B: Leg stretch If you can move your legs, try to "draw" letters on the floor with the toes of your foot. Write your name with one foot. Write your name with the toes of your other foot. Slowly repeat the movements for one minute as tolerated. Exercise C: Reach for the sky Reach your hands as far over your head as you can to stretch your spine. Move your hands and arms as if you are climbing a rope. Slowly repeat the movements for one minute as tolerated. Range of motion exercises Exercise A: Shoulder roll Let your arms  hang loosely at your sides. Lift just your shoulders up toward your ears, then let them relax back down. When your shoulders feel loose, rotate your shoulders in backward and forward circles. Do shoulder rolls slowly for one minute as tolerated. Exercise B: March in place As if you are marching, pump your arms and lift your legs up and down. Lift your knees as high as you can. If you are unable to lift your knees, just  pump your arms and move your ankles and feet up and down. March in place for one minute as tolerated. Exercise C: Seated jumping jacks Let your arms hang down straight. Keeping your arms straight, lift them up over your head. Aim to point your fingers to the ceiling. While you lift your arms, straighten your legs and slide your heels along the floor to your sides, as wide as you can. As you bring your arms back down to your sides, slide your legs back together. If you are unable to use your legs, just move your arms. Slowly repeat seated jumping jacks for one minute as tolerated. Strengthening exercises Exercise A: Shoulder squeeze Hold your arms straight out from your body to your sides, with your elbows bent and your fists pointed at the ceiling. Keeping your arms in the bent position, move them forward so your elbows and forearms meet in front of your face. Open your arms back out as wide as you can with your elbows still bent, until you feel your shoulder blades squeezing together. Hold for 5 seconds. Slowly repeat the movements forward and backward for one minute as tolerated. Contact a health care provider if: You have to stop exercising due to any of the following: Pain. Nausea. Shortness of breath. Dizziness. Fatigue. You have significant pain or soreness after exercising. Get help right away if: You have chest pain. You have difficulty breathing. These symptoms may represent a serious problem that is an emergency. Do not wait to see if the symptoms will go away. Get medical help right away. Call your local emergency services (911 in the [place] Do not drive yourself to the hospital. Summary Exercises that you do while sitting (chair exercises) can strengthen your heart, burn calories, and keep muscles and joints healthy. You may benefit from chair exercises if you are unable to do standing exercises due to diabetic foot pain, obesity, recovery from surgery or injury, or other  conditions. Before starting chair exercises, check with your health care provider or a physical therapist to find out how much exercise you can tolerate and which exercises are safe for you. This information is not intended to replace advice given to you by your health care provider. Make sure you discuss any questions you have with your health care provider. Document Revised: 10/27/2020 Document Reviewed: 10/27/2020 Elsevier Patient Education  2024 [employer] Preventing Diabetes Mellitus Complications You can help to prevent or slow down problems that are caused by diabetes (diabetes mellitus). If you follow your diabetes plan and take care of yourself, you can lower your chances of having severe problems. What actions can I take to prevent problems caused by diabetes? Managing your diabetes  Follow instructions from your health care providers about how to manage your diabetes. You may have a team of health care providers. They can teach you how to care for yourself and can answer any questions you have. Learn about your condition. This can help you make healthy choices when it comes to eating and physical activity. Know your  target range for your blood sugar (glucose). Check your blood glucose levels. Your health care provider will help you decide how often to check your levels. How often you check may depend on your goals for treatment and how well you are meeting them. Ask your health care provider if you should take a low dose of aspirin every day. Ask what dose is best for you. Taking a low dose of aspirin can help prevent heart disease. Controlling your blood pressure and cholesterol Your target blood pressure is based on: Your age. Your medicines. How long you have had diabetes. Other conditions you have. Controlling your cholesterol may: Help prevent heart disease and stroke. Improve your blood flow. To control your blood pressure and cholesterol: Follow instructions from your  health care provider about meal planning, exercise, and medicines. Make sure your health care provider checks your blood pressure at every visit. Monitor your blood pressure at home as told by your health care provider. Have your cholesterol checked at least once a year. A medicine called statin can help to lower your cholesterol. Ask your health care provider if you are or should be taking a statin.  Medical appointments and vaccines Have yearly physical exams and eye exams. Your health care providers will tell you how often you need to see them. It may depend on your diabetes plan. Every visit with a health care provider should include a measure of: Your weight. Your blood pressure. Your blood glucose. Your A1C level should be checked: At least 2 times a year, if you are meeting your treatment goals. 4 times a year, if you are not meeting treatment goals or if your goals have changed. Your blood lipids (lipid profile) should be checked once a year. You should also be checked once a year for protein in your urine (urine microalbumin). If you have type 1 diabetes, get an eye exam within 5 years after you are diagnosed. Get an exam once a year after that first exam. If you have type 2 diabetes, get an eye exam as soon as you are diagnosed. Get an exam once a year after that first exam. Keep your vaccines current. You should get: A flu vaccine every year. A pneumonia vaccine and a hepatitis B vaccine. If you are 65 years or older, you may get the pneumonia vaccine as a series of two shots. Ask your health care provider what other vaccines you may need to get. Keep all follow-up visits. This can help ensure that problems can be avoided or found early and treated. Lifestyle Do not use any products that contain nicotine or tobacco. These products include cigarettes, chewing tobacco, and vaping devices, such as e-cigarettes. If you need help quitting, ask your health care provider. If you quit  smoking, you may: Lower your risk for heart attack, stroke, nerve disease, and kidney disease. Help your blood move through your body better. Help your blood pressure and cholesterol levels. Do not drink alcohol if: Your health care provider tells you not to drink. You are pregnant, may be pregnant, or are planning to become pregnant. If you drink alcohol: Limit how much you have to: 0-1 drink a day for women. 0-2 drinks a day for men. Know how much alcohol is in your drink. In the [place] one drink equals one 12 oz bottle of beer (355 mL), one 5 oz glass of wine (148 mL), or one 1 oz glass of hard liquor (44 mL). Taking care of your feet Diabetes may cause  you to have poor blood flow to your legs and feet. It can also cause: The skin on your feet to get thinner, break more easily, and heal more slowly. Nerve damage in your legs and feet. This can result in less feeling. You may not notice small injuries. This could lead to bigger problems. To avoid foot problems: Check your skin and feet every day for cuts, bruises, redness, blisters, or sores. Have a foot exam with your health care provider once a year. During the exam, your health care provider will: Look at the structure and skin of your feet. Check your pulses and amount of feeling in your feet. Make sure that your health care provider does a visual foot exam at every visit.  Taking care of your teeth People who have diabetes that is not controlled well are more likely to have gum disease (periodontal disease). Diabetes can make gum disease harder to control. If not treated, it can lead to tooth loss. To prevent this: Brush your teeth twice a day. Floss at least once a day. Visit your dentist 2 times a year. Managing stress Living with diabetes can be stressful. When you are stressed, you may: Have higher blood glucose because of stress hormones. Be distracted from taking good care of yourself. Be aware of your stress level and  make changes to help you manage tough times. To lower your stress levels: Think about joining a support group. Do planned relaxation or meditation. Do a hobby that you enjoy. Maintain healthy relationships. Try to exercise every day. Work with your health care provider or a mental health professional. Where to find more information American Diabetes Association: diabetes.org Association of Diabetes Care & Education Specialists: diabeteseducator.org This information is not intended to replace advice given to you by your health care provider. Make sure you discuss any questions you have with your health care provider. Document Revised: 03/04/2022 Document Reviewed: 03/04/2022 Elsevier Patient Education  2024 Elsevier Inc.   This is a list of the screening recommended for you and due dates:  Health Maintenance  Topic Date Due   Eye exam for diabetics  Never done   DTaP/Tdap/Td vaccine (1 - Tdap) Never done   Pap with HPV screening  07/19/2016   Mammogram  11/11/2019   COVID-19 Vaccine (1 - 2023-24 season) Never done   Flu Shot  12/13/2023*   Yearly kidney health urinalysis for diabetes  07/15/2023   Complete foot exam   07/15/2023   Hemoglobin A1C  11/12/2023   Yearly kidney function blood test for diabetes  05/12/2024   Medicare Annual Wellness Visit  06/06/2024   Cologuard (Stool DNA test)  11/05/2024   Hepatitis C Screening  Completed   HIV Screening  Completed   HPV Vaccine  Aged Out  *Topic was postponed. The date shown is not the original due date.    Advanced directives: (ACP Link)Information on Advanced Care Planning can be found at [facility] of [facility] Advance Health Care Directives Advance Health Care Directives ([email or web address]/)   Next Medicare Annual Wellness Visit scheduled for next year: Yes Preventive Care 71-68 Years Old, Female Preventive care refers to lifestyle choices and visits with your health care provider that can promote health and wellness.  Preventive care visits are also called wellness exams. What can I expect for my preventive care visit? Counseling Your health care provider may ask you questions about your: Medical history, including: Past medical problems. Family medical history. Pregnancy history. Current health, including: Menstrual cycle.  Method of birth control. Emotional well-being. Home life and relationship well-being. Sexual activity and sexual health. Lifestyle, including: Alcohol, nicotine or tobacco, and drug use. Access to firearms. Diet, exercise, and sleep habits. Work and work Astronomer. Sunscreen use. Safety issues such as seatbelt and bike helmet use. Physical exam Your health care provider will check your: Height and weight. These may be used to calculate your BMI (body mass index). BMI is a measurement that tells if you are at a healthy weight. Waist circumference. This measures the distance around your waistline. This measurement also tells if you are at a healthy weight and may help predict your risk of certain diseases, such as type 2 diabetes and high blood pressure. Heart rate and blood pressure. Body temperature. Skin for abnormal spots. What immunizations do I need?  Vaccines are usually given at various ages, according to a schedule. Your health care provider will recommend vaccines for you based on your age, medical history, and lifestyle or other factors, such as travel or where you work. What tests do I need? Screening Your health care provider may recommend screening tests for certain conditions. This may include: Lipid and cholesterol levels. Diabetes screening. This is done by checking your blood sugar (glucose) after you have not eaten for a while (fasting). Pelvic exam and Pap test. Hepatitis B test. Hepatitis C test. HIV (human immunodeficiency virus) test. STI (sexually transmitted infection) testing, if you are at risk. Lung cancer screening. Colorectal cancer  screening. Mammogram. Talk with your health care provider about when you should start having regular mammograms. This may depend on whether you have a family history of breast cancer. BRCA-related cancer screening. This may be done if you have a family history of breast, ovarian, tubal, or peritoneal cancers. Bone density scan. This is done to screen for osteoporosis. Talk with your health care provider about your test results, treatment options, and if necessary, the need for more tests. Follow these instructions at home: Eating and drinking  Eat a diet that includes fresh fruits and vegetables, whole grains, lean protein, and low-fat dairy products. Take vitamin and mineral supplements as recommended by your health care provider. Do not drink alcohol if: Your health care provider tells you not to drink. You are pregnant, may be pregnant, or are planning to become pregnant. If you drink alcohol: Limit how much you have to 0-1 drink a day. Know how much alcohol is in your drink. In the [place] one drink equals one 12 oz bottle of beer (355 mL), one 5 oz glass of wine (148 mL), or one 1 oz glass of hard liquor (44 mL). Lifestyle Brush your teeth every morning and night with fluoride toothpaste. Floss one time each day. Exercise for at least 30 minutes 5 or more days each week. Do not use any products that contain nicotine or tobacco. These products include cigarettes, chewing tobacco, and vaping devices, such as e-cigarettes. If you need help quitting, ask your health care provider. Do not use drugs. If you are sexually active, practice safe sex. Use a condom or other form of protection to prevent STIs. If you do not wish to become pregnant, use a form of birth control. If you plan to become pregnant, see your health care provider for a prepregnancy visit. Take aspirin only as told by your health care provider. Make sure that you understand how much to take and what form to take. Work with your  health care provider to find out whether it is safe  and beneficial for you to take aspirin daily. Find healthy ways to manage stress, such as: Meditation, yoga, or listening to music. Journaling. Talking to a trusted person. Spending time with friends and family. Minimize exposure to UV radiation to reduce your risk of skin cancer. Safety Always wear your seat belt while driving or riding in a vehicle. Do not drive: If you have been drinking alcohol. Do not ride with someone who has been drinking. When you are tired or distracted. While texting. If you have been using any mind-altering substances or drugs. Wear a helmet and other protective equipment during sports activities. If you have firearms in your house, make sure you follow all gun safety procedures. Seek help if you have been physically or sexually abused. What's next? Visit your health care provider once a year for an annual wellness visit. Ask your health care provider how often you should have your eyes and teeth checked. Stay up to date on all vaccines. This information is not intended to replace advice given to you by your health care provider. Make sure you discuss any questions you have with your health care provider. Document Revised: 02/26/2021 Document Reviewed: 02/26/2021 Elsevier Patient Education  2024 [employer].

## 2023-06-07 NOTE — Progress Notes (Signed)
 Because this visit was a virtual/telehealth visit,  certain criteria was not obtained, such a blood pressure, CBG if applicable, and timed get up and go. Any medications not marked as "taking" were not mentioned during the medication reconciliation part of the visit. Any vitals not documented were not able to be obtained due to this being a telehealth visit or patient was unable to self-report a recent blood pressure reading due to a lack of equipment at home via telehealth. Vitals that have been documented are verbally provided by the patient.   Subjective:   Jasmin Castro is a 47 y.o. female who presents for Medicare Annual (Subsequent) preventive examination.  Visit Complete: Virtual  I connected with  Anacaren M Magana on 06/07/23 by a audio enabled telemedicine application and verified that I am speaking with the correct person using two identifiers.  Patient Location: Home  Provider Location: Home Office  I discussed the limitations of evaluation and management by telemedicine. The patient expressed understanding and agreed to proceed.  Patient Medicare AWV questionnaire was completed by the patient on 06/03/2023; I have confirmed that all information answered by patient is correct and no changes since this date.  Cardiac Risk Factors include: diabetes mellitus;dyslipidemia;hypertension;sedentary lifestyle;obesity (BMI >30kg/m2)     Objective:    Today's Vitals   06/03/23 1655 06/07/23 0907  Weight:  (!) 461 lb (209.1 kg)  Height:  5\' 5"  (1.651 m)  PainSc: 6     Body mass index is 76.71 kg/m.     06/07/2023    9:07 AM 06/01/2022    9:14 AM 05/28/2021    9:18 AM 09/16/2018   10:26 AM 12/21/2014    9:45 AM  Advanced Directives  Does Patient Have a Medical Advance Directive? No No No No No  Would patient like information on creating a medical advance directive? No - Patient declined No - Patient declined No - Patient declined No - Patient declined No - patient declined information     Current Medications (verified) Outpatient Encounter Medications as of 06/07/2023  Medication Sig   acetaminophen (TYLENOL) 325 MG tablet Take 650 mg by mouth every 6 (six) hours as needed.   albuterol (VENTOLIN HFA) 108 (90 Base) MCG/ACT inhaler Inhale 1-2 puffs into the lungs every 6 (six) hours as needed for wheezing or shortness of breath.   Alirocumab (PRALUENT) 150 MG/ML SOAJ Inject 1 mL (150 mg total) into the skin every 14 (fourteen) days.   amLODipine (NORVASC) 5 MG tablet Take 1 tablet (5 mg total) by mouth daily.   Cetirizine HCl (ZYRTEC ALLERGY) 10 MG TBDP Take 10 mg by mouth at bedtime.   clotrimazole-betamethasone (LOTRISONE) cream Apply topically 2 (two) times daily.   enalapril (VASOTEC) 20 MG tablet TAKE 1 TABLET(20 MG) BY MOUTH DAILY   fluticasone (FLONASE) 50 MCG/ACT nasal spray USE 2 SPRAYS IN EACH NOSTRIL ONCE DAILY.  SHAKE GENTLY BEFORE USING.   IBU 600 MG tablet Take 600 mg by mouth 2 (two) times daily as needed.   LORazepam (ATIVAN) 1 MG tablet Take 1 mg by mouth at bedtime.   meclizine (ANTIVERT) 12.5 MG tablet TAKE ONE TABLET BY MOUTH THREE TIMES DAILY AS NEEDED FOR DIZZINESS OR NAUSEA   mupirocin ointment (BACTROBAN) 2 % Apply topically daily.   nystatin cream (MYCOSTATIN) APPLY ONE APPLICATION TOPICALLY TWICE DAILY   OLANZapine (ZYPREXA) 15 MG tablet Take 15 mg by mouth at bedtime.   Omega-3 Fatty Acids (FISH OIL) 1000 MG CAPS Take 1 capsule by mouth  daily.   ondansetron (ZOFRAN) 4 MG tablet Take 1 tablet (4 mg total) by mouth every 8 (eight) hours as needed for nausea or vomiting.   PRISTIQ 100 MG 24 hr tablet Take 100 mg by mouth at bedtime.   Semaglutide, 1 MG/DOSE, 4 MG/3ML SOPN Inject 1 mg as directed once a week.   Vitamin D, Ergocalciferol, (DRISDOL) 1.25 MG (50000 UNIT) CAPS capsule Take 1 capsule (50,000 Units total) by mouth every 7 (seven) days.   No facility-administered encounter medications on file as of 06/07/2023.    Allergies  (verified) Statins, Crestor [rosuvastatin], Keflex [cephalexin], Lodine [etodolac], and Seroquel [quetiapine fumarate]   History: Past Medical History:  Diagnosis Date   Allergy    Anemia    Arthritis    Asthma    Depression    Diabetes mellitus without complication (HCC) 2021   GERD (gastroesophageal reflux disease)    Hyperlipemia    Hypertension    Obesity    Panic attacks    Paranoid (HCC)    PCOS (polycystic ovarian syndrome)    Prediabetes    Sleep apnea    Urge incontinence 07/19/2013   Past Surgical History:  Procedure Laterality Date   HYSTEROSCOPY DIAGNOSTIC     insulin resistant     Family History  Problem Relation Age of Onset   Hyperlipidemia Mother    Arthritis Mother    Diabetes Mother    Heart disease Mother    Stroke Mother    Hyperlipidemia Father    Cancer Father        colon and prostate , brain tumor   Diabetes Sister        prediatic   Arthritis Sister    Hyperlipidemia Sister    Obesity Sister    Hyperlipidemia Brother    Hypertension Brother    Anxiety disorder Brother    Arthritis Brother    Diabetes Brother    Obesity Brother    Diabetes Sister    Social History   Socioeconomic History   Marital status: Single    Spouse name: Not on file   Number of children: Not on file   Years of education: Not on file   Highest education level: Not on file  Occupational History   Not on file  Tobacco Use   Smoking status: Never   Smokeless tobacco: Never   Tobacco comments:    Never smoked  Substance and Sexual Activity   Alcohol use: No   Drug use: No   Sexual activity: Never    Birth control/protection: None  Other Topics Concern   Not on file  Social History Narrative   Lives alone but pt states she sees family daily.   Social Determinants of Health   Financial Resource Strain: Low Risk  (06/03/2023)   Overall Financial Resource Strain (CARDIA)    Difficulty of Paying Living Expenses: Not hard at all  Food Insecurity: No  Food Insecurity (06/03/2023)   Hunger Vital Sign    Worried About Running Out of Food in the Last Year: Never true    Ran Out of Food in the Last Year: Never true  Transportation Needs: No Transportation Needs (06/03/2023)   PRAPARE - Administrator, Civil Service (Medical): No    Lack of Transportation (Non-Medical): No  Physical Activity: Inactive (06/03/2023)   Exercise Vital Sign    Days of Exercise per Week: 0 days    Minutes of Exercise per Session: 0 min  Stress: No Stress  Concern Present (06/03/2023)   [employer] of Occupational Health - Occupational Stress Questionnaire    Feeling of Stress : Not at all  Social Connections: Socially Isolated (06/03/2023)   Social Connection and Isolation Panel [NHANES]    Frequency of Communication with Friends and Family: Once a week    Frequency of Social Gatherings with Friends and Family: Once a week    Attends Religious Services: Never    Database administrator or Organizations: No    Attends Engineer, structural: Never    Marital Status: Never married    Tobacco Counseling Counseling given: No Tobacco comments: Never smoked   Clinical Intake:  Pre-visit preparation completed: Yes  Pain : 0-10 Pain Score: 6  Pain Type: Chronic pain Pain Location: Knee Pain Orientation: Right, Left Pain Descriptors / Indicators: Constant, Other (Comment) (grinding) Pain Onset: More than a month ago Pain Frequency: Constant     Nutritional Status: BMI > 30  Obese Nutritional Risks: None Diabetes: Yes CBG done?: No (telehealth visit. unable to obtain cbg) Did pt. bring in CBG monitor from home?: No  How often do you need to have someone help you when you read instructions, pamphlets, or other written materials from your doctor or pharmacy?: 1 - Never  Interpreter Needed?: No  Information entered by ::  Debbra Digiulio, CMA   Activities of Daily Living    06/03/2023    4:55 PM  In your present state of health,  do you have any difficulty performing the following activities:  Difficulty concentrating or making decisions? 0  Walking or climbing stairs? 1  Dressing or bathing? 1  Doing errands, shopping? 1  Preparing Food and eating ? N  Using the Toilet? N  In the past six months, have you accidently leaked urine? Y  Do you have problems with loss of bowel control? N  Managing your Finances? N  Housekeeping or managing your Housekeeping? N    Patient Care Team: Anabel Halon, MD as PCP - General (Internal Medicine)  Indicate any recent Medical Services you may have received from other than [facility] providers in the past year (date may be approximate).     Assessment:   This is a routine wellness examination for Jaselle.  Hearing/Vision screen Hearing Screening - Comments:: Patient denies any hearing difficulties.   Vision Screening - Comments:: Wears prescription glasses. Referral placed today for patient to establish with a provider    Goals Addressed             This Visit's Progress    Patient Stated       Lose some weight        Depression Screen    06/07/2023    9:12 AM 02/23/2023    1:38 PM 07/14/2022    1:23 PM 06/10/2022   11:24 AM 06/01/2022    9:14 AM 06/01/2022    9:13 AM 12/11/2021    2:40 PM  PHQ 2/9 Scores  PHQ - 2 Score 0 0 0 0 0 0 0  PHQ- 9 Score 0  0 0       Fall Risk    06/03/2023    4:55 PM 02/23/2023    1:37 PM 07/14/2022    1:23 PM 06/10/2022   11:24 AM 06/01/2022    9:14 AM  Fall Risk   Falls in the past year? 0 0 0 0 0  Number falls in past yr: 0 0 0 0 0  Injury with Fall? 0 0  0 0 0  Risk for fall due to : Impaired balance/gait;Impaired mobility;Orthopedic patient  No Fall Risks No Fall Risks No Fall Risks  Follow up Falls prevention discussed;Education provided  Falls evaluation completed Falls evaluation completed Falls evaluation completed    MEDICARE RISK AT HOME: Medicare Risk at Home Home free of loose throw rugs in walkways, pet beds,  electrical cords, etc?: No Adequate lighting in your home to reduce risk of falls?: Yes Life alert?: No Use of a cane, walker or w/c?: Yes Grab bars in the bathroom?: Yes Shower chair or bench in shower?: Yes Elevated toilet seat or a handicapped toilet?: Yes  TIMED UP AND GO:  Was the test performed?  No    Cognitive Function:        06/07/2023    9:11 AM 06/01/2022    9:15 AM 05/28/2021    9:21 AM  6CIT Screen  What Year? 0 points 0 points 0 points  What month? 0 points 0 points 0 points  What time? 0 points 0 points 0 points  Count back from 20 0 points 0 points 0 points  Months in reverse 0 points 0 points 0 points  Repeat phrase 0 points 0 points 2 points  Total Score 0 points 0 points 2 points    Immunizations Immunization History  Administered Date(s) Administered   Influenza Split 07/18/2012   Influenza,inj,Quad PF,6+ Mos 10/13/2013, 09/12/2014, 05/21/2015    TDAP status: Due, Education has been provided regarding the importance of this vaccine. Advised may receive this vaccine at local pharmacy or Health Dept. Aware to provide a copy of the vaccination record if obtained from local pharmacy or Health Dept. Verbalized acceptance and understanding.  Flu Vaccine status: Due, Education has been provided regarding the importance of this vaccine. Advised may receive this vaccine at local pharmacy or Health Dept. Aware to provide a copy of the vaccination record if obtained from local pharmacy or Health Dept. Verbalized acceptance and understanding.  Pneumococcal vaccine status: Not age appropriate for this patient.   Covid-19 vaccine status: Information provided on how to obtain vaccines.   Qualifies for Shingles Vaccine? No   Shingrix: Not age appropriate for this patient.   Screening Tests Health Maintenance  Topic Date Due   OPHTHALMOLOGY EXAM  Never done   DTaP/Tdap/Td (1 - Tdap) Never done   Cervical Cancer Screening (HPV/Pap Cotest)  07/19/2016   COVID-19  Vaccine (1 - 2023-24 season) Never done   Medicare Annual Wellness (AWV)  06/02/2023   INFLUENZA VACCINE  12/13/2023 (Originally 04/15/2023)   Diabetic kidney evaluation - Urine ACR  07/15/2023   FOOT EXAM  07/15/2023   HEMOGLOBIN A1C  11/12/2023   Diabetic kidney evaluation - eGFR measurement  05/12/2024   Fecal DNA (Cologuard)  11/05/2024   Hepatitis C Screening  Completed   HIV Screening  Completed   HPV VACCINES  Aged Out    Health Maintenance  Health Maintenance Due  Topic Date Due   OPHTHALMOLOGY EXAM  Never done   DTaP/Tdap/Td (1 - Tdap) Never done   Cervical Cancer Screening (HPV/Pap Cotest)  07/19/2016   COVID-19 Vaccine (1 - 2023-24 season) Never done   Medicare Annual Wellness (AWV)  06/02/2023    Colorectal cancer screening: Type of screening: Cologuard. Completed 11/09/2021. Repeat every 3 years  Mammogram status: Ordered 06/07/2023. Pt provided with contact info and advised to call to schedule appt.   Bone Density Screening: Not age appropriate for this patient.    Lung  Cancer Screening: (Low Dose CT Chest recommended if Age 25-80 years, 20 pack-year currently smoking OR have quit w/in 15years.) does not qualify.   Additional Screening:  Hepatitis C Screening: does not qualify; Completed 08/28/2020  Vision Screening: Recommended annual ophthalmology exams for early detection of glaucoma and other disorders of the eye. Is the patient up to date with their annual eye exam?  No  Who is the provider or what is the name of the office in which the patient attends annual eye exams? Referral placed If pt is not established with a provider, would they like to be referred to a provider to establish care? No .   Dental Screening: Recommended annual dental exams for proper oral hygiene  Diabetic Foot Exam: Diabetic Foot Exam: Completed 07/15/2023  Community Resource Referral / Chronic Care Management: CRR required this visit?  No   CCM required this visit?  No      Plan:     I have personally reviewed and noted the following in the patient's chart:   Medical and social history Use of alcohol, tobacco or illicit drugs  Current medications and supplements including opioid prescriptions. Patient is not currently taking opioid prescriptions. Functional ability and status Nutritional status Physical activity Advanced directives List of other physicians Hospitalizations, surgeries, and ER visits in previous 12 months Vitals Screenings to include cognitive, depression, and falls Referrals and appointments  In addition, I have reviewed and discussed with patient certain preventive protocols, quality metrics, and best practice recommendations. A written personalized care plan for preventive services as well as general preventive health recommendations were provided to patient.     Jordan Hawks Dayan Desa, CMA   06/07/2023   After Visit Summary: (MyChart) Due to this being a telephonic visit, the after visit summary with patients personalized plan was offered to patient via MyChart   Nurse Notes: n/a

## 2023-06-07 NOTE — Addendum Note (Signed)
Addended by: Recardo Evangelist A on: 06/07/2023 09:32 AM   Modules accepted: Orders

## 2023-06-14 NOTE — Telephone Encounter (Signed)
Pt called in wants to know status on pre auth  See previous message

## 2023-08-03 ENCOUNTER — Other Ambulatory Visit: Payer: Self-pay | Admitting: Internal Medicine

## 2023-08-03 DIAGNOSIS — L0882 Omphalitis not of newborn: Secondary | ICD-10-CM

## 2023-08-03 DIAGNOSIS — J4520 Mild intermittent asthma, uncomplicated: Secondary | ICD-10-CM

## 2023-08-03 MED ORDER — ALBUTEROL SULFATE HFA 108 (90 BASE) MCG/ACT IN AERS: 1.0000 | INHALATION_SPRAY | Freq: Four times a day (QID) | RESPIRATORY_TRACT | 3 refills | Status: AC | PRN

## 2023-08-03 MED ORDER — MUPIROCIN 2 % EX OINT: TOPICAL_OINTMENT | Freq: Every day | CUTANEOUS | 0 refills | Status: DC

## 2023-08-03 MED ORDER — FLUTICASONE PROPIONATE 50 MCG/ACT NA SUSP: 2.0000 | Freq: Every day | NASAL | 6 refills | Status: AC

## 2023-08-03 MED ORDER — CLOTRIMAZOLE-BETAMETHASONE 1-0.05 % EX CREA
TOPICAL_CREAM | Freq: Two times a day (BID) | CUTANEOUS | 0 refills | Status: DC
Start: 2023-08-03 — End: 2023-10-14

## 2023-08-03 MED ORDER — IBU 600 MG PO TABS: 600.0000 mg | ORAL_TABLET | Freq: Two times a day (BID) | ORAL | 2 refills | Status: DC | PRN

## 2023-08-03 NOTE — Telephone Encounter (Signed)
Copied from CRM [id number]. Topic: Clinical - Medication Refill >> Aug 03, 2023  9:46 AM Raven B wrote: Most Recent Primary Care Visit:  Provider: Recardo Evangelist A  Department: [facility]  Visit Type: MEDICARE AWV, SEQUENTIAL  Date: 06/07/2023  Medication: albuterol (VENTOLIN HFA) 108 (90 Base) MCG/ACT inhaler, fluticasone (FLONASE) 50 MCG/ACT nasal spray, IBU 600 MG, clotrimazole-betamethasone (LOTRISONE) cream,   Has the patient contacted their pharmacy? No (Agent: If no, request that the patient contact the pharmacy for the refill. If patient does not wish to contact the pharmacy document the reason why and proceed with request.) (Agent: If yes, when and what did the pharmacy advise?)  Is this the correct pharmacy for this prescription? Yes If no, delete pharmacy and type the correct one.  This is the patient's preferred pharmacy:   [facility] [address] [place] - [address] [place] [address] Phone: [phone] Fax: [phone]  [facility] [phone] - [place], [place] - [address] AT [place] [address] [place] [address] Phone: [phone] Fax: [phone]   Has the prescription been filled recently? No  Is the patient out of the medication? Yes  Has the patient been seen for an appointment in the last year OR does the patient have an upcoming appointment? No  Can we respond through MyChart? Yes  Agent: Please be advised that Rx refills may take up to 3 business days. We ask that you follow-up with your pharmacy.

## 2023-08-06 ENCOUNTER — Other Ambulatory Visit: Payer: Self-pay | Admitting: Internal Medicine

## 2023-08-16 ENCOUNTER — Other Ambulatory Visit: Payer: Self-pay | Admitting: Internal Medicine

## 2023-08-19 ENCOUNTER — Other Ambulatory Visit: Payer: Self-pay | Admitting: Internal Medicine

## 2023-08-19 ENCOUNTER — Ambulatory Visit: Payer: Medicare Other | Admitting: Internal Medicine

## 2023-08-19 DIAGNOSIS — E559 Vitamin D deficiency, unspecified: Secondary | ICD-10-CM

## 2023-09-03 ENCOUNTER — Other Ambulatory Visit: Payer: Self-pay | Admitting: Internal Medicine

## 2023-09-03 DIAGNOSIS — E1169 Type 2 diabetes mellitus with other specified complication: Secondary | ICD-10-CM

## 2023-10-07 ENCOUNTER — Other Ambulatory Visit: Payer: Self-pay | Admitting: Internal Medicine

## 2023-10-10 ENCOUNTER — Other Ambulatory Visit: Payer: Self-pay | Admitting: Internal Medicine

## 2023-10-13 ENCOUNTER — Encounter: Payer: Self-pay | Admitting: Internal Medicine

## 2023-10-13 NOTE — Telephone Encounter (Signed)
Scheduled later day and time per patient

## 2023-10-14 ENCOUNTER — Other Ambulatory Visit: Payer: Self-pay | Admitting: Internal Medicine

## 2023-10-14 DIAGNOSIS — E1169 Type 2 diabetes mellitus with other specified complication: Secondary | ICD-10-CM

## 2023-10-14 DIAGNOSIS — L0882 Omphalitis not of newborn: Secondary | ICD-10-CM

## 2023-10-15 ENCOUNTER — Other Ambulatory Visit: Payer: Self-pay | Admitting: Internal Medicine

## 2023-10-15 MED ORDER — TRULICITY 3 MG/0.5ML ~~LOC~~ SOAJ: 3.0000 mg | SUBCUTANEOUS | 1 refills | Status: DC

## 2023-10-15 MED ORDER — CLOTRIMAZOLE-BETAMETHASONE 1-0.05 % EX CREA: TOPICAL_CREAM | Freq: Two times a day (BID) | CUTANEOUS | 0 refills | Status: DC

## 2023-10-21 ENCOUNTER — Ambulatory Visit: Payer: Medicare Other | Admitting: Internal Medicine

## 2023-11-11 ENCOUNTER — Other Ambulatory Visit: Payer: Self-pay | Admitting: Internal Medicine

## 2023-12-20 ENCOUNTER — Ambulatory Visit: Payer: Medicare Other | Admitting: Internal Medicine

## 2023-12-20 ENCOUNTER — Encounter: Payer: Self-pay | Admitting: Internal Medicine

## 2023-12-20 ENCOUNTER — Telehealth (INDEPENDENT_AMBULATORY_CARE_PROVIDER_SITE_OTHER): Admitting: Internal Medicine

## 2023-12-20 VITALS — BP 133/79 | Ht 65.0 in | Wt >= 6400 oz

## 2023-12-20 DIAGNOSIS — M170 Bilateral primary osteoarthritis of knee: Secondary | ICD-10-CM

## 2023-12-20 DIAGNOSIS — I10 Essential (primary) hypertension: Secondary | ICD-10-CM | POA: Diagnosis not present

## 2023-12-20 DIAGNOSIS — E1169 Type 2 diabetes mellitus with other specified complication: Secondary | ICD-10-CM | POA: Diagnosis not present

## 2023-12-20 DIAGNOSIS — E785 Hyperlipidemia, unspecified: Secondary | ICD-10-CM | POA: Diagnosis not present

## 2023-12-20 DIAGNOSIS — Z7985 Long-term (current) use of injectable non-insulin antidiabetic drugs: Secondary | ICD-10-CM | POA: Diagnosis not present

## 2023-12-20 DIAGNOSIS — F411 Generalized anxiety disorder: Secondary | ICD-10-CM | POA: Diagnosis not present

## 2023-12-20 DIAGNOSIS — Z6841 Body Mass Index (BMI) 40.0 and over, adult: Secondary | ICD-10-CM

## 2023-12-20 MED ORDER — PRALUENT 150 MG/ML ~~LOC~~ SOAJ: 150.0000 mg | SUBCUTANEOUS | 5 refills | Status: DC

## 2023-12-20 MED ORDER — ENALAPRIL MALEATE 20 MG PO TABS: 20.0000 mg | ORAL_TABLET | Freq: Every day | ORAL | 1 refills | Status: DC

## 2023-12-20 MED ORDER — AMLODIPINE BESYLATE 5 MG PO TABS: 5.0000 mg | ORAL_TABLET | Freq: Every day | ORAL | 1 refills | Status: DC

## 2023-12-20 MED ORDER — TIRZEPATIDE 5 MG/0.5ML ~~LOC~~ SOAJ: 5.0000 mg | SUBCUTANEOUS | 1 refills | Status: DC

## 2023-12-20 NOTE — Assessment & Plan Note (Signed)
 OA in setting of morbid obesity Worsening gait  Not a surgical candidate due to weight Needs walker and bench to assist with ADL's and hygiene Ibuprofen PRN for pain Advised to contact her Orthopedic surgeon [facility]

## 2023-12-20 NOTE — Assessment & Plan Note (Addendum)
 BP Readings from Last 1 Encounters:  12/20/23 133/79   Well-controlled with Enalapril 20 mg QD and amlodipine 5 mg QD Counseled for compliance with the medications Advised DASH diet and moderate exercise/walking as tolerated

## 2023-12-20 NOTE — Progress Notes (Signed)
 Virtual Visit via Video Note   Because of Jasmin Castro's co-morbid illnesses, she is at least at moderate risk for complications without adequate follow up.  This format is felt to be most appropriate for this patient at this time.  All issues noted in this document were discussed and addressed.  A limited physical exam was performed with this format.      Evaluation Performed:  Follow-up visit  Date:  12/20/2023   ID:  Castro EBERSOLE, DOB 03-Jan-1976, MRN [id number]  Patient Location: Home Provider Location: Office/Clinic  Participants: Patient Location of Patient: Home Location of Provider: Telehealth Consent was obtain for visit to be over via telehealth. I verified that I am speaking with the correct person using two identifiers.  PCP:  Jasmin Halon, MD   Chief Complaint: Follow up of her chronic medical conditions  History of Present Illness:    Jasmin Castro is a 48 y.o. female with PMH of HTN, DM, morbid obesity with metabolic syndrome, HLD, asthma and depression with anxiety who has a video visit for f/u of her chronic medical conditions.   She has not followed up since 08/24.   BP is wnl at home. Takes enalapril 20 mg QD and amlodipine 5 mg QD regularly. Patient denies headache, dizziness, chest pain, dyspnea or palpitations.  She admits that she has to eat fast food on most of the days, which leads to elevated blood pressure.  DM: Has been taking Trulicity 3 mg qw. Denies any polyuria or polyphagia. Her last HbA1C was 6.1 in 08/24.  HLD: She has had allergic reaction to statins.  She is tolerating Praluent well.  She was prescribed Repatha in the past, but had itching over face area due to it.  Her last lipid profile showed uncontrolled cholesterol profile, with LDL at 177.  Knee pain: She reports recent worsening of bilateral knee pain for the last 2 weeks.  She has to help her mother with her ADLs, which worsens her knee pain.  She has seen Dr. Cleophas Dunker in [place]  for knee OA.  She takes ibuprofen 600 mg twice daily as needed for knee pain.  Her BMI is 74.  She has lost about 11 lbs since the last visit. Her ambulation and overall physical activity is limited due to morbid obesity.  She is currently independent with her ADLs.  She is able to drive and gets help for transportation from her family members at times.  The patient does not have symptoms concerning for COVID-19 infection (fever, chills, cough, or new shortness of breath).   Past Medical, Surgical, Social History, Allergies, and Medications have been Reviewed.  Past Medical History:  Diagnosis Date   Allergy    Anemia    Arthritis    Asthma    Depression    Diabetes mellitus without complication (HCC) 2021   GERD (gastroesophageal reflux disease)    Hyperlipemia    Hypertension    Obesity    Panic attacks    Paranoid (HCC)    PCOS (polycystic ovarian syndrome)    Prediabetes    Sleep apnea    Urge incontinence 07/19/2013   Past Surgical History:  Procedure Laterality Date   HYSTEROSCOPY DIAGNOSTIC     insulin resistant       Current Meds  Medication Sig   acetaminophen (TYLENOL) 325 MG tablet Take 650 mg by mouth every 6 (six) hours as needed.   albuterol (VENTOLIN HFA) 108 (90 Base) MCG/ACT  inhaler Inhale 1-2 puffs into the lungs every 6 (six) hours as needed for wheezing or shortness of breath.   Cetirizine HCl (ZYRTEC ALLERGY) 10 MG TBDP Take 10 mg by mouth at bedtime.   clotrimazole-betamethasone (LOTRISONE) cream Apply topically 2 (two) times daily.   fluticasone (FLONASE) 50 MCG/ACT nasal spray Place 2 sprays into both nostrils daily.   IBU 600 MG tablet Take 1 tablet (600 mg total) by mouth 2 (two) times daily as needed.   LORazepam (ATIVAN) 1 MG tablet Take 1 mg by mouth at bedtime.   meclizine (ANTIVERT) 12.5 MG tablet TAKE ONE TABLET BY MOUTH THREE TIMES DAILY AS NEEDED FOR DIZZINESS OR NAUSEA   mupirocin ointment (BACTROBAN) 2 % Apply topically daily.   nystatin  cream (MYCOSTATIN) APPLY ONE APPLICATION TOPICALLY TWICE DAILY   OLANZapine (ZYPREXA) 15 MG tablet TAKE ONE TABLET BY MOUTH EVERY EVENING   Omega-3 Fatty Acids (FISH OIL) 1000 MG CAPS Take 1 capsule by mouth daily.   ondansetron (ZOFRAN) 4 MG tablet Take 1 tablet (4 mg total) by mouth every 8 (eight) hours as needed for nausea or vomiting.   PRISTIQ 100 MG 24 hr tablet Take 100 mg by mouth at bedtime.   tirzepatide [facility]) 5 MG/0.5ML Pen Inject 5 mg into the skin once a week.   Vitamin D, Ergocalciferol, (DRISDOL) 1.25 MG (50000 UNIT) CAPS capsule TAKE 1 CAPSULE BY MOUTH EVERY 7 DAYS   [DISCONTINUED] amLODipine (NORVASC) 5 MG tablet Take 1 tablet (5 mg total) by mouth daily.   [DISCONTINUED] enalapril (VASOTEC) 20 MG tablet TAKE 1 TABLET(20 MG) BY MOUTH DAILY   [DISCONTINUED] PRALUENT 150 MG/ML SOAJ ADMINISTER 1 ML(150 MG) UNDER THE SKIN EVERY 14 DAYS   [DISCONTINUED] TRULICITY 3 MG/0.5ML SOAJ Inject 3 mg into the skin every 7 (seven) days.     Allergies:   Statins, Crestor [rosuvastatin], Keflex [cephalexin], Lodine [etodolac], and Seroquel [quetiapine fumarate]   ROS:   Please see the history of present illness. All other systems reviewed and are negative.   Labs/Other Tests and Data Reviewed:    Recent Labs: 05/13/2023: ALT 20; BUN 6; Creatinine, Ser 0.69; Hemoglobin 13.1; Platelets 475; Potassium 4.4; Sodium 141; TSH 1.880   Recent Lipid Panel Lab Results  Component Value Date/Time   CHOL 243 (H) 05/13/2023 03:47 PM   TRIG 97 05/13/2023 03:47 PM   HDL 49 05/13/2023 03:47 PM   CHOLHDL 5.0 (H) 05/13/2023 03:47 PM   CHOLHDL 5.1 (H) 08/28/2020 10:34 AM   LDLCALC 177 (H) 05/13/2023 03:47 PM   LDLCALC 184 (H) 08/28/2020 10:34 AM    Wt Readings from Last 3 Encounters:  12/20/23 (!) 450 lb (204.1 kg)  06/07/23 (!) 461 lb (209.1 kg)  05/13/23 (!) 461 lb (209.1 kg)     Objective:    Vital Signs:  BP 133/79 (BP Location: Left Arm)   Ht 5\' 5"  (1.651 m)   Wt (!) 450 lb (204.1  kg)   BMI 74.88 kg/m    VITAL SIGNS:  reviewed GEN:  no acute distress EYES:  sclerae anicteric, EOMI - Extraocular Movements Intact RESPIRATORY:  normal respiratory effort, symmetric expansion NEURO:  alert and oriented x 3, no obvious focal deficit PSYCH:  normal affect  ASSESSMENT & PLAN:    Hypertension BP Readings from Last 1 Encounters:  12/20/23 133/79   Well-controlled with Enalapril 20 mg QD and amlodipine 5 mg QD Counseled for compliance with the medications Advised DASH diet and moderate exercise/walking as tolerated  Hyperlipidemia associated  with type 2 diabetes mellitus (HCC) Part of metabolic syndrome Her LDL has been up to 190 Did not tolerate multiple different statins or had an allergic reaction Had facial itching with Repatha She needs lipid lowering agent due to her history of type II DM and HTN to reduce risk of CVA and CAD- on Praluent now Needs updated lipid profile  Type 2 diabetes mellitus with other specified complication (HCC) Lab Results  Component Value Date   HGBA1C 6.1 (H) 05/13/2023   Associated with morbid obesity and HTN Well-controlled with Trulicity 3 mg qw Switched to [facility] for better weight loss benefit Needs to comply to diabetic diet On statin and ACEi Diabetic eye exam: Advised to follow up with Ophthalmology for diabetic eye exam  OA (osteoarthritis) of knee OA in setting of morbid obesity Worsening gait  Not a surgical candidate due to weight Needs walker and bench to assist with ADL's and hygiene Ibuprofen PRN for pain Advised to contact her Orthopedic surgeon [facility]  GAD (generalized anxiety disorder) On Ativan PRN, prescribed by Psychiatry    I discussed the assessment and treatment plan with the patient. The patient was provided an opportunity to ask questions, and all were answered. The patient agreed with the plan and demonstrated an understanding of the instructions.   The patient was advised  to call back or seek an in-person evaluation if the symptoms worsen or if the condition fails to improve as anticipated.  The above assessment and management plan was discussed with the patient. The patient verbalized understanding of and has agreed to the management plan.   Medication Adjustments/Labs and Tests Ordered: Current medicines are reviewed at length with the patient today.  Concerns regarding medicines are outlined above.   Tests Ordered: Orders Placed This Encounter  Procedures   CMP14+EGFR   Hemoglobin A1c   Lipid Profile   Urine Microalbumin w/creat. ratio    Medication Changes: Meds ordered this encounter  Medications   tirzepatide (MOUNJARO) 5 MG/0.5ML Pen    Sig: Inject 5 mg into the skin once a week.    Dispense:  6 mL    Refill:  1   amLODipine (NORVASC) 5 MG tablet    Sig: Take 1 tablet (5 mg total) by mouth daily.    Dispense:  90 tablet    Refill:  1   enalapril (VASOTEC) 20 MG tablet    Sig: Take 1 tablet (20 mg total) by mouth daily.    Dispense:  90 tablet    Refill:  1   Alirocumab (PRALUENT) 150 MG/ML SOAJ    Sig: Inject 1 mL (150 mg total) into the skin every 14 (fourteen) days.    Dispense:  2 mL    Refill:  5     Note: This dictation was prepared with Dragon dictation along with smaller phrase technology. Similar sounding words can be transcribed inadequately or may not be corrected upon review. Any transcriptional errors that result from this process are unintentional.      Disposition:  Follow up  Signed, Jasmin Halon, MD  12/20/2023 1:50 PM     [facility]

## 2023-12-20 NOTE — Assessment & Plan Note (Signed)
 Part of metabolic syndrome Her LDL has been up to 190 Did not tolerate multiple different statins or had an allergic reaction Had facial itching with Repatha She needs lipid lowering agent due to her history of type II DM and HTN to reduce risk of CVA and CAD- on Praluent now Needs updated lipid profile

## 2023-12-20 NOTE — Patient Instructions (Addendum)
 Please take ibuprofen and Tylenol arthritis alternatively for knee pain.  Please contact orthopedic surgeon for persistent knee pain.  Please start taking Mounjaro after completing current supply of Trulicity.  Continue to follow small, frequent meals.  Please continue to take medications as prescribed.  Please continue to follow low carb diet and perform moderate exercise/walking as tolerated.  Please get fasting blood tests done within a week.

## 2023-12-20 NOTE — Assessment & Plan Note (Signed)
On Ativan PRN, prescribed by Psychiatry

## 2023-12-20 NOTE — Assessment & Plan Note (Signed)
 Lab Results  Component Value Date   HGBA1C 6.1 (H) 05/13/2023   Associated with morbid obesity and HTN Well-controlled with Trulicity 3 mg qw Switched to [facility] for better weight loss benefit Needs to comply to diabetic diet On statin and ACEi Diabetic eye exam: Advised to follow up with Ophthalmology for diabetic eye exam

## 2023-12-30 NOTE — Telephone Encounter (Unsigned)
 Copied from CRM [id number]. Topic: General - Other >> Dec 30, 2023 12:48 PM Jasmin Castro wrote: Reason for CRM: Patient called to double check lab orders placed by the pcp "Confirmed orders placed on 4/7" she would also like to ask if the office would be open 4/24 and 4/25 so she could come in for the labs that day and should she be fasting when she comes in  Patient call back number [phone] (M)

## 2024-01-08 ENCOUNTER — Other Ambulatory Visit: Payer: Self-pay | Admitting: Internal Medicine

## 2024-01-08 DIAGNOSIS — E1169 Type 2 diabetes mellitus with other specified complication: Secondary | ICD-10-CM

## 2024-01-12 ENCOUNTER — Other Ambulatory Visit: Payer: Self-pay | Admitting: Internal Medicine

## 2024-01-21 DIAGNOSIS — E1169 Type 2 diabetes mellitus with other specified complication: Secondary | ICD-10-CM | POA: Diagnosis not present

## 2024-01-21 DIAGNOSIS — E785 Hyperlipidemia, unspecified: Secondary | ICD-10-CM | POA: Diagnosis not present

## 2024-01-21 LAB — HM DIABETES EYE EXAM

## 2024-01-22 LAB — CMP14+EGFR
ALT: 17 IU/L (ref 0–32)
AST: 21 IU/L (ref 0–40)
Albumin: 4.3 g/dL (ref 3.9–4.9)
Alkaline Phosphatase: 146 IU/L — ABNORMAL HIGH (ref 44–121)
BUN/Creatinine Ratio: 12 (ref 9–23)
BUN: 7 mg/dL (ref 6–24)
Bilirubin Total: 0.4 mg/dL (ref 0.0–1.2)
CO2: 21 mmol/L (ref 20–29)
Calcium: 10 mg/dL (ref 8.7–10.2)
Chloride: 103 mmol/L (ref 96–106)
Creatinine, Ser: 0.59 mg/dL (ref 0.57–1.00)
Globulin, Total: 2.8 g/dL (ref 1.5–4.5)
Glucose: 110 mg/dL — ABNORMAL HIGH (ref 70–99)
Potassium: 4.2 mmol/L (ref 3.5–5.2)
Sodium: 142 mmol/L (ref 134–144)
Total Protein: 7.1 g/dL (ref 6.0–8.5)
eGFR: 110 mL/min/{1.73_m2} (ref >59)

## 2024-01-22 LAB — LIPID PANEL
Chol/HDL Ratio: 3.1 ratio (ref 0.0–4.4)
Cholesterol, Total: 174 mg/dL (ref 100–199)
HDL: 57 mg/dL (ref >39)
LDL Chol Calc (NIH): 96 mg/dL (ref 0–99)
Triglycerides: 119 mg/dL (ref 0–149)
VLDL Cholesterol Cal: 21 mg/dL (ref 5–40)

## 2024-01-22 LAB — HEMOGLOBIN A1C
Est. average glucose Bld gHb Est-mCnc: 117 mg/dL
Hgb A1c MFr Bld: 5.7 % — ABNORMAL HIGH (ref 4.8–5.6)

## 2024-01-24 ENCOUNTER — Encounter: Payer: Self-pay | Admitting: Internal Medicine

## 2024-01-24 LAB — MICROALBUMIN / CREATININE URINE RATIO
Creatinine, Urine: 196.9 mg/dL
Microalb/Creat Ratio: 73 mg/g{creat} — ABNORMAL HIGH (ref 0–29)
Microalbumin, Urine: 143.6 ug/mL

## 2024-01-27 ENCOUNTER — Other Ambulatory Visit: Payer: Self-pay

## 2024-01-27 DIAGNOSIS — M545 Low back pain, unspecified: Secondary | ICD-10-CM

## 2024-01-27 DIAGNOSIS — R399 Unspecified symptoms and signs involving the genitourinary system: Secondary | ICD-10-CM

## 2024-01-28 ENCOUNTER — Encounter: Payer: Self-pay | Admitting: Internal Medicine

## 2024-01-28 ENCOUNTER — Telehealth: Admitting: Internal Medicine

## 2024-01-28 VITALS — Ht 65.0 in | Wt >= 6400 oz

## 2024-01-28 DIAGNOSIS — N3000 Acute cystitis without hematuria: Secondary | ICD-10-CM | POA: Diagnosis not present

## 2024-01-28 DIAGNOSIS — M2550 Pain in unspecified joint: Secondary | ICD-10-CM

## 2024-01-28 DIAGNOSIS — J209 Acute bronchitis, unspecified: Secondary | ICD-10-CM | POA: Diagnosis not present

## 2024-01-28 DIAGNOSIS — Z7985 Long-term (current) use of injectable non-insulin antidiabetic drugs: Secondary | ICD-10-CM

## 2024-01-28 DIAGNOSIS — E785 Hyperlipidemia, unspecified: Secondary | ICD-10-CM

## 2024-01-28 DIAGNOSIS — E1169 Type 2 diabetes mellitus with other specified complication: Secondary | ICD-10-CM | POA: Diagnosis not present

## 2024-01-28 DIAGNOSIS — M545 Low back pain, unspecified: Secondary | ICD-10-CM

## 2024-01-28 DIAGNOSIS — Z8261 Family history of arthritis: Secondary | ICD-10-CM

## 2024-01-28 MED ORDER — AMOXICILLIN-POT CLAVULANATE 875-125 MG PO TABS: 1.0000 | ORAL_TABLET | Freq: Two times a day (BID) | ORAL | 0 refills | Status: DC

## 2024-01-28 MED ORDER — CYCLOBENZAPRINE HCL 5 MG PO TABS: 5.0000 mg | ORAL_TABLET | Freq: Two times a day (BID) | ORAL | 1 refills | Status: AC | PRN

## 2024-01-28 NOTE — Addendum Note (Signed)
 Addended byCleola Dach on: 01/28/2024 03:52 PM   Modules accepted: Level of Service

## 2024-01-28 NOTE — Assessment & Plan Note (Signed)
 Acute on chronic low back pain Ibuprofen  or naproxen  as needed for mild to moderate pain Flexeril  as needed for muscle spasms Avoid heavy lifting and frequent bending Her morbid obesity limits her ambulation, which can also cause physical deconditioning and muscle atrophy

## 2024-01-28 NOTE — Assessment & Plan Note (Signed)
 Has chronic low back pain, bilateral knee pain and arm pain Check ANA and RF to rule out rheumatoid arthritis

## 2024-01-28 NOTE — Assessment & Plan Note (Signed)
 Part of metabolic syndrome Her LDL has been up to 190 in the past Did not tolerate multiple different statins or had an allergic reaction Had facial itching with Repatha  She needs lipid lowering agent due to her history of type II DM and HTN to reduce risk of CVA and CAD- on Praluent  now Recent lipid profile showed significant improvement in LDL

## 2024-01-28 NOTE — Assessment & Plan Note (Addendum)
 Lab Results  Component Value Date   HGBA1C 5.7 (H) 01/21/2024   Associated with morbid obesity, HLD and HTN Well-controlled with Mounjaro  5 mg QW Advised to comply to diabetic diet On statin and ACEi Diabetic eye exam: Advised to follow up with Ophthalmology for diabetic eye exam

## 2024-01-28 NOTE — Progress Notes (Signed)
 Virtual Visit via Video Note   Because of Jasmin Castro's co-morbid illnesses, she is at least at moderate risk for complications without adequate follow up.  This format is felt to be most appropriate for this patient at this time.  All issues noted in this document were discussed and addressed.  A limited physical exam was performed with this format.      Evaluation Performed:  Follow-up visit  Date:  01/28/2024   ID:  Jasmin Castro, DOB [birth year], MRN [id number]  Patient Location: Home Provider Location: Office/Clinic  Participants: Patient Location of Patient: Home Location of Provider: Telehealth Consent was obtain for visit to be over via telehealth. I verified that I am speaking with the correct person using two identifiers.  PCP:  Meldon Sport, MD   Chief Complaint: Urinary frequency, lower abdominal pressure and back muscle spasms  History of Present Illness:    Jasmin Castro is a 48 y.o. female with PMH of HTN, DM, morbid obesity with metabolic syndrome, HLD, asthma and depression with anxiety who has a video visit for complaint of urinary frequency, lower abdominal pressure and back muscle spasms for the last 5 days.  She reports urinary frequency and lower abdominal pressure with flank pain.  Denies any fever, chills.  Denies any nausea or vomiting.  She also reports chronic back pain, which is worse since 01/21/24.  Pain is constant, dull, radiating towards sides and bilateral lower extremities.  Denies saddle anesthesia, urinary or stool incontinence.  She has very limited mobility due to her morbid obesity.  She also has a history of osteoarthritis of the knee.  She reports that her sister was recently diagnosed with rheumatoid arthritis.  She also reports cough, chest congestion and exertional dyspnea.  Denies any fever or chills.  She has been using Flonase  for nasal congestion.  She has albuterol  inhaler for dyspnea or wheezing.  The patient does not  have symptoms concerning for COVID-19 infection (fever, chills, cough, or new shortness of breath).   Past Medical, Surgical, Social History, Allergies, and Medications have been Reviewed.  Past Medical History:  Diagnosis Date   Allergy     Anemia    Arthritis    Asthma    Depression    Diabetes mellitus without complication (HCC) 2021   GERD (gastroesophageal reflux disease)    Hyperlipemia    Hypertension    Obesity    Panic attacks    Paranoid (HCC)    PCOS (polycystic ovarian syndrome)    Prediabetes    Sleep apnea    Urge incontinence 07/19/2013   Past Surgical History:  Procedure Laterality Date   HYSTEROSCOPY DIAGNOSTIC     insulin resistant       Current Meds  Medication Sig   acetaminophen  (TYLENOL ) 325 MG tablet Take 650 mg by mouth every 6 (six) hours as needed.   albuterol  (VENTOLIN  HFA) 108 (90 Base) MCG/ACT inhaler Inhale 1-2 puffs into the lungs every 6 (six) hours as needed for wheezing or shortness of breath.   amLODipine  (NORVASC ) 5 MG tablet Take 1 tablet (5 mg total) by mouth daily.   amoxicillin -clavulanate (AUGMENTIN) 875-125 MG tablet Take 1 tablet by mouth 2 (two) times daily.   Cetirizine  HCl (ZYRTEC  ALLERGY ) 10 MG TBDP Take 10 mg by mouth at bedtime.   clotrimazole -betamethasone  (LOTRISONE ) cream Apply topically 2 (two) times daily.   cyclobenzaprine  (FLEXERIL ) 5 MG tablet Take 1 tablet (5 mg total) by mouth 2 (two) times  daily as needed.   enalapril  (VASOTEC ) 20 MG tablet Take 1 tablet (20 mg total) by mouth daily.   fluticasone  (FLONASE ) 50 MCG/ACT nasal spray Place 2 sprays into both nostrils daily.   ibuprofen  (ADVIL) 600 MG tablet Take 1 tablet by mouth twice daily as needed   LORazepam (ATIVAN) 1 MG tablet Take 1 mg by mouth at bedtime.   meclizine  (ANTIVERT ) 12.5 MG tablet TAKE ONE TABLET BY MOUTH THREE TIMES DAILY AS NEEDED FOR DIZZINESS OR NAUSEA   mupirocin  ointment (BACTROBAN ) 2 % Apply topically daily.   nystatin  cream (MYCOSTATIN )  APPLY ONE APPLICATION TOPICALLY TWICE DAILY   OLANZapine (ZYPREXA) 15 MG tablet TAKE ONE TABLET BY MOUTH EVERY EVENING   Omega-3 Fatty Acids (FISH OIL) 1000 MG CAPS Take 1 capsule by mouth daily.   ondansetron  (ZOFRAN ) 4 MG tablet Take 1 tablet (4 mg total) by mouth every 8 (eight) hours as needed for nausea or vomiting.   PRALUENT  150 MG/ML SOAJ ADMINISTER 1 ML(150 MG) UNDER THE SKIN EVERY 14 DAYS   PRISTIQ 100 MG 24 hr tablet Take 100 mg by mouth at bedtime.   tirzepatide  (MOUNJARO ) 5 MG/0.5ML Pen Inject 5 mg into the skin once a week.   Vitamin D , Ergocalciferol , (DRISDOL ) 1.25 MG (50000 UNIT) CAPS capsule TAKE 1 CAPSULE BY MOUTH EVERY 7 DAYS     Allergies:   Statins, Crestor  [rosuvastatin ], Keflex  [cephalexin ], Lodine [etodolac], and Seroquel [quetiapine fumarate]   ROS:   Please see the history of present illness. All other systems reviewed and are negative.   Labs/Other Tests and Data Reviewed:    Recent Labs: 05/13/2023: Hemoglobin 13.1; Platelets 475; TSH 1.880 01/21/2024: ALT 17; BUN 7; Creatinine, Ser 0.59; Potassium 4.2; Sodium 142   Recent Lipid Panel Lab Results  Component Value Date/Time   CHOL 174 01/21/2024 11:30 AM   TRIG 119 01/21/2024 11:30 AM   HDL 57 01/21/2024 11:30 AM   CHOLHDL 3.1 01/21/2024 11:30 AM   CHOLHDL 5.1 (H) 08/28/2020 10:34 AM   LDLCALC 96 01/21/2024 11:30 AM   LDLCALC 184 (H) 08/28/2020 10:34 AM    Wt Readings from Last 3 Encounters:  01/28/24 (!) 450 lb (204.1 kg)  12/20/23 (!) 450 lb (204.1 kg)  06/07/23 (!) 461 lb (209.1 kg)     Objective:    Vital Signs:  Ht 5\' 5"  (1.651 m)   Wt (!) 450 lb (204.1 kg)   BMI 74.88 kg/m    VITAL SIGNS:  reviewed GEN:  no acute distress EYES:  sclerae anicteric, EOMI - Extraocular Movements Intact RESPIRATORY:  normal respiratory effort, symmetric expansion NEURO:  alert and oriented x 3, no obvious focal deficit PSYCH:  normal affect  ASSESSMENT & PLAN:    Low back pain Acute on chronic low  back pain Ibuprofen  or naproxen  as needed for mild to moderate pain Flexeril  as needed for muscle spasms Avoid heavy lifting and frequent bending Her morbid obesity limits her ambulation, which can also cause physical deconditioning and muscle atrophy  Family history of rheumatoid arthritis Her sister was recently diagnosed with rheumatoid arthritis Considering her chronic knee and back pain, will check rheumatoid factor and ANA  Type 2 diabetes mellitus with other specified complication (HCC) Lab Results  Component Value Date   HGBA1C 5.7 (H) 01/21/2024   Associated with morbid obesity, HLD and HTN Well-controlled with Mounjaro  5 mg QW Advised to comply to diabetic diet On statin and ACEi Diabetic eye exam: Advised to follow up with Ophthalmology for diabetic  eye exam  Hyperlipidemia associated with type 2 diabetes mellitus (HCC) Part of metabolic syndrome Her LDL has been up to 190 in the past Did not tolerate multiple different statins or had an allergic reaction Had facial itching with Repatha  She needs lipid lowering agent due to her history of type II DM and HTN to reduce risk of CVA and CAD- on Praluent  now Recent lipid profile showed significant improvement in LDL  Polyarthralgia Has chronic low back pain, bilateral knee pain and arm pain Check ANA and RF to rule out rheumatoid arthritis  Urinary tract infection without hematuria Unable to come to office for urine culture due to limited mobility and transportation issues Started empiric Augmentin (considering her upper respiratory symptoms as well) If persistent symptoms, will need to arrange urine culture Advised to maintain adequate hydration  Acute bronchitis Chest congestion and dyspnea likely due to acute bronchitis Started empiric Augmentin Avoided oral steroids due to type II DM and morbid obesity, if persistent symptoms, can consider short-term oral steroids Continue albuterol  as needed for dyspnea or  wheezing Mucinex  or Robitussin as needed for cough   I discussed the assessment and treatment plan with the patient. The patient was provided an opportunity to ask questions, and all were answered. The patient agreed with the plan and demonstrated an understanding of the instructions.   The patient was advised to call back or seek an in-person evaluation if the symptoms worsen or if the condition fails to improve as anticipated.  The above assessment and management plan was discussed with the patient. The patient verbalized understanding of and has agreed to the management plan.   Medication Adjustments/Labs and Tests Ordered: Current medicines are reviewed at length with the patient today.  Concerns regarding medicines are outlined above.   Tests Ordered: Orders Placed This Encounter  Procedures   CMP14+EGFR   Hemoglobin A1c   Rheumatoid Factor   ANA w/Reflex   Lipid Profile    Medication Changes: Meds ordered this encounter  Medications   amoxicillin -clavulanate (AUGMENTIN) 875-125 MG tablet    Sig: Take 1 tablet by mouth 2 (two) times daily.    Dispense:  14 tablet    Refill:  0   cyclobenzaprine  (FLEXERIL ) 5 MG tablet    Sig: Take 1 tablet (5 mg total) by mouth 2 (two) times daily as needed.    Dispense:  30 tablet    Refill:  1     Note: This dictation was prepared with Dragon dictation along with smaller phrase technology. Similar sounding words can be transcribed inadequately or may not be corrected upon review. Any transcriptional errors that result from this process are unintentional.      Disposition:  Follow up  Signed, Meldon Sport, MD  01/28/2024 3:52 PM     [facility]

## 2024-01-28 NOTE — Assessment & Plan Note (Signed)
 Chest congestion and dyspnea likely due to acute bronchitis Started empiric Augmentin Avoided oral steroids due to type II DM and morbid obesity, if persistent symptoms, can consider short-term oral steroids Continue albuterol  as needed for dyspnea or wheezing Mucinex  or Robitussin as needed for cough

## 2024-01-28 NOTE — Assessment & Plan Note (Signed)
 Unable to come to office for urine culture due to limited mobility and transportation issues Started empiric Augmentin (considering her upper respiratory symptoms as well) If persistent symptoms, will need to arrange urine culture Advised to maintain adequate hydration

## 2024-01-28 NOTE — Assessment & Plan Note (Signed)
 Her sister was recently diagnosed with rheumatoid arthritis Considering her chronic knee and back pain, will check rheumatoid factor and ANA

## 2024-01-28 NOTE — Patient Instructions (Signed)
 Please start taking Augmentin as prescribed.  Please contact us  if you continue to have urinary symptoms.  Please take Flexeril  as needed for back muscle spasms.  Please get fasting blood tests done after 3 months.

## 2024-02-03 ENCOUNTER — Other Ambulatory Visit: Payer: Self-pay | Admitting: Internal Medicine

## 2024-02-08 ENCOUNTER — Other Ambulatory Visit: Payer: Self-pay | Admitting: Internal Medicine

## 2024-02-08 DIAGNOSIS — E559 Vitamin D deficiency, unspecified: Secondary | ICD-10-CM

## 2024-02-23 ENCOUNTER — Other Ambulatory Visit: Payer: Self-pay | Admitting: Internal Medicine

## 2024-02-23 NOTE — Telephone Encounter (Signed)
Faxed for records

## 2024-03-03 ENCOUNTER — Telehealth: Payer: Self-pay

## 2024-03-03 ENCOUNTER — Other Ambulatory Visit: Payer: Self-pay | Admitting: Internal Medicine

## 2024-03-03 DIAGNOSIS — E1169 Type 2 diabetes mellitus with other specified complication: Secondary | ICD-10-CM

## 2024-03-03 MED ORDER — TRULICITY 3 MG/0.5ML ~~LOC~~ SOAJ
3.0000 mg | SUBCUTANEOUS | 5 refills | Status: AC
Start: 2024-03-03 — End: ?

## 2024-03-03 NOTE — Telephone Encounter (Signed)
Pt scheduled on 09/30.

## 2024-03-03 NOTE — Telephone Encounter (Signed)
 Copied from CRM [id number]. Topic: Clinical - Medication Question >> Mar 03, 2024  8:04 AM Lizabeth Riggs wrote: Reason for CRM:  Carina wants to stop taking Mounjaro  and start taking TRULICITY  3 MG/0.5ML SOAJ again. Mounjaro  is making her have stomach issues (pain, constipation). Please send Trulicity  to [facility] listed in her chart.  Please call Jordie to discuss at [phone] or message through MyChart.

## 2024-03-03 NOTE — Progress Notes (Signed)
 Patient has more GI symptoms, such as abdominal pain with Mounjaro . She had tolerated Trulicity  better and prefers to switch back to it. Sent Trulicity  for now.

## 2024-03-09 ENCOUNTER — Other Ambulatory Visit: Payer: Self-pay | Admitting: Internal Medicine

## 2024-03-23 ENCOUNTER — Other Ambulatory Visit: Payer: Self-pay | Admitting: Internal Medicine

## 2024-03-23 MED ORDER — IBUPROFEN 600 MG PO TABS: 600.0000 mg | ORAL_TABLET | Freq: Two times a day (BID) | ORAL | 1 refills | Status: DC | PRN

## 2024-03-23 NOTE — Telephone Encounter (Signed)
 Copied from CRM [id number]. Topic: Clinical - Medication Refill >> Mar 23, 2024  8:35 AM Turkey B wrote: Medication: ibuprofen  (ADVIL ) 600 MG tablet Pt didn't contact pharmacy because Pt is requesting a 90 day supply because she runs out fast   This is the patient's preferred pharmacy:  [facility] [address], [place] - [address] [place] [address] Phone: [phone] Fax: [phone]  Is this the correct pharmacy for this prescription? yes Has the prescription been filled recently? no  Is the patient out of the medication? yes  Has the patient been seen for an appointment in the last year OR does the patient have an upcoming appointment? yes  Can we respond through MyChart? yes  Agent: Please be advised that Rx refills may take up to 3 business days. We ask that you follow-up with your pharmacy.

## 2024-04-12 ENCOUNTER — Other Ambulatory Visit (HOSPITAL_BASED_OUTPATIENT_CLINIC_OR_DEPARTMENT_OTHER): Payer: Self-pay

## 2024-04-12 ENCOUNTER — Other Ambulatory Visit (HOSPITAL_COMMUNITY): Payer: Self-pay

## 2024-04-12 MED ORDER — TRULICITY 3 MG/0.5ML ~~LOC~~ SOAJ
3.0000 mg | SUBCUTANEOUS | 5 refills | Status: DC
  Filled 2024-04-12 – 2024-04-15 (×3): qty 2, 28d supply, fill #0
  Filled 2024-05-11: qty 2, 28d supply, fill #1
  Filled 2024-06-28: qty 2, 28d supply, fill #2
  Filled 2024-07-22: qty 2, 28d supply, fill #3

## 2024-04-12 MED ORDER — PRALUENT 150 MG/ML ~~LOC~~ SOAJ
150.0000 mg | SUBCUTANEOUS | 6 refills | Status: AC
  Filled 2024-04-12 – 2024-05-11 (×2): qty 2, 28d supply, fill #0
  Filled 2024-06-28: qty 2, 28d supply, fill #1
  Filled 2024-07-22: qty 2, 28d supply, fill #2
  Filled 2024-08-29: qty 2, 28d supply, fill #0
  Filled 2024-08-29: qty 2, 28d supply, fill #3
  Filled 2024-09-22: qty 2, 28d supply, fill #1
  Filled 2024-10-20: qty 2, 28d supply, fill #2

## 2024-04-14 ENCOUNTER — Other Ambulatory Visit: Payer: Self-pay

## 2024-04-14 ENCOUNTER — Other Ambulatory Visit (HOSPITAL_COMMUNITY): Payer: Self-pay

## 2024-04-14 ENCOUNTER — Other Ambulatory Visit (HOSPITAL_BASED_OUTPATIENT_CLINIC_OR_DEPARTMENT_OTHER): Payer: Self-pay

## 2024-04-15 ENCOUNTER — Other Ambulatory Visit (HOSPITAL_COMMUNITY): Payer: Self-pay

## 2024-04-17 ENCOUNTER — Other Ambulatory Visit (HOSPITAL_COMMUNITY): Payer: Self-pay

## 2024-04-17 ENCOUNTER — Other Ambulatory Visit: Payer: Self-pay

## 2024-04-17 ENCOUNTER — Other Ambulatory Visit (HOSPITAL_BASED_OUTPATIENT_CLINIC_OR_DEPARTMENT_OTHER): Payer: Self-pay

## 2024-04-18 ENCOUNTER — Other Ambulatory Visit (HOSPITAL_BASED_OUTPATIENT_CLINIC_OR_DEPARTMENT_OTHER): Payer: Self-pay

## 2024-05-11 ENCOUNTER — Other Ambulatory Visit: Payer: Self-pay

## 2024-05-11 ENCOUNTER — Other Ambulatory Visit (HOSPITAL_COMMUNITY): Payer: Self-pay

## 2024-05-18 ENCOUNTER — Telehealth (INDEPENDENT_AMBULATORY_CARE_PROVIDER_SITE_OTHER): Payer: Self-pay

## 2024-05-18 VITALS — Ht 65.0 in | Wt >= 6400 oz

## 2024-05-18 DIAGNOSIS — J329 Chronic sinusitis, unspecified: Secondary | ICD-10-CM | POA: Diagnosis not present

## 2024-05-18 MED ORDER — AMOXICILLIN-POT CLAVULANATE 875-125 MG PO TABS: 1.0000 | ORAL_TABLET | Freq: Two times a day (BID) | ORAL | 0 refills | Status: AC

## 2024-05-18 MED ORDER — BENZONATATE 200 MG PO CAPS: 200.0000 mg | ORAL_CAPSULE | Freq: Two times a day (BID) | ORAL | 0 refills | Status: AC | PRN

## 2024-05-18 NOTE — Progress Notes (Signed)
 Virtual Visit via Video Note  I connected with Jasmin Castro on 05/18/24 at 10:00 AM EDT by a video enabled telemedicine application and verified that I am speaking with the correct person using two identifiers.  Patient Location: Home Provider Location: Office/Clinic  I discussed the limitations, risks, security, and privacy concerns of performing an evaluation and management service by video and the availability of in person appointments. I also discussed with the patient that there may be a patient responsible charge related to this service. The patient expressed understanding and agreed to proceed.  Subjective: PCP: Tobie Suzzane POUR, MD  Chief Complaint  Patient presents with   Ear Pain    Pt complains of bilateral ear pain, cough, body aches, and headache x2 week. Denies fever. Taking ibuprofen .    HPI   ROS: Per HPI  Current Outpatient Medications:    acetaminophen  (TYLENOL ) 325 MG tablet, Take 650 mg by mouth every 6 (six) hours as needed., Disp: , Rfl:    albuterol  (VENTOLIN  HFA) 108 (90 Base) MCG/ACT inhaler, Inhale 1-2 puffs into the lungs every 6 (six) hours as needed for wheezing or shortness of breath., Disp: 8.5 g, Rfl: 3   Alirocumab  (PRALUENT ) 150 MG/ML SOAJ, Inject 1 mL (150 mg total) as directed every 14 (fourteen) days., Disp: 2 mL, Rfl: 6   amLODipine  (NORVASC ) 5 MG tablet, Take 1 tablet (5 mg total) by mouth daily., Disp: 90 tablet, Rfl: 1   benzonatate  (TESSALON ) 200 MG capsule, Take 1 capsule (200 mg total) by mouth 2 (two) times daily as needed for cough., Disp: 20 capsule, Rfl: 0   Cetirizine  HCl (ZYRTEC  ALLERGY ) 10 MG TBDP, Take 10 mg by mouth at bedtime., Disp: 30 tablet, Rfl: 0   clotrimazole -betamethasone  (LOTRISONE ) cream, Apply topically 2 (two) times daily., Disp: 60 g, Rfl: 0   cyclobenzaprine  (FLEXERIL ) 5 MG tablet, Take 1 tablet (5 mg total) by mouth 2 (two) times daily as needed., Disp: 30 tablet, Rfl: 1   Dulaglutide  (TRULICITY ) 3 MG/0.5ML SOAJ,  Inject 3 mg as directed once a week., Disp: 2 mL, Rfl: 5   Dulaglutide  (TRULICITY ) 3 MG/0.5ML SOAJ, Inject 3 mg into the skin once a week., Disp: 2 mL, Rfl: 5   enalapril  (VASOTEC ) 20 MG tablet, Take 1 tablet (20 mg total) by mouth daily., Disp: 90 tablet, Rfl: 1   fluticasone  (FLONASE ) 50 MCG/ACT nasal spray, Place 2 sprays into both nostrils daily., Disp: 16 g, Rfl: 6   ibuprofen  (ADVIL ) 600 MG tablet, Take 1 tablet (600 mg total) by mouth 2 (two) times daily as needed., Disp: 90 tablet, Rfl: 1   LORazepam (ATIVAN) 1 MG tablet, Take 1 mg by mouth at bedtime., Disp: , Rfl:    meclizine  (ANTIVERT ) 12.5 MG tablet, TAKE ONE TABLET BY MOUTH THREE TIMES DAILY AS NEEDED FOR DIZZINESS OR NAUSEA, Disp: 30 tablet, Rfl: 0   mupirocin  ointment (BACTROBAN ) 2 %, Apply topically daily., Disp: 22 g, Rfl: 0   nystatin  cream (MYCOSTATIN ), APPLY ONE APPLICATION TOPICALLY TWICE DAILY, Disp: 30 g, Rfl: 2   OLANZapine (ZYPREXA) 15 MG tablet, TAKE ONE TABLET BY MOUTH EVERY EVENING, Disp: 90 tablet, Rfl: 0   Omega-3 Fatty Acids (FISH OIL) 1000 MG CAPS, Take 1 capsule by mouth daily., Disp: , Rfl:    ondansetron  (ZOFRAN ) 4 MG tablet, Take 1 tablet (4 mg total) by mouth every 8 (eight) hours as needed for nausea or vomiting., Disp: 20 tablet, Rfl: 0   PRALUENT  150 MG/ML SOAJ, ADMINISTER  1 ML(150 MG) UNDER THE SKIN EVERY 14 DAYS, Disp: 2 mL, Rfl: 5   PRISTIQ 100 MG 24 hr tablet, Take 100 mg by mouth at bedtime., Disp: , Rfl:    Vitamin D , Ergocalciferol , (DRISDOL ) 1.25 MG (50000 UNIT) CAPS capsule, TAKE 1 CAPSULE BY MOUTH EVERY 7 DAYS, Disp: 12 capsule, Rfl: 1   amoxicillin -clavulanate (AUGMENTIN ) 875-125 MG tablet, Take 1 tablet by mouth 2 (two) times daily., Disp: 14 tablet, Rfl: 0  Observations/Objective: Today's Vitals   05/18/24 0951  Weight: (!) 450 lb (204.1 kg)  Height: 5' 5 (1.651 m)   Physical Exam  Assessment and Plan: Sinusitis, unspecified chronicity, unspecified location -     Amoxicillin -Pot  Clavulanate; Take 1 tablet by mouth 2 (two) times daily.  Dispense: 14 tablet; Refill: 0 -     Benzonatate ; Take 1 capsule (200 mg total) by mouth 2 (two) times daily as needed for cough.  Dispense: 20 capsule; Refill: 0    Follow Up Instructions: No follow-ups on file.   I discussed the assessment and treatment plan with the patient. The patient was provided an opportunity to ask questions, and all were answered. The patient agreed with the plan and demonstrated an understanding of the instructions.   The patient was advised to call back or seek an in-person evaluation if the symptoms worsen or if the condition fails to improve as anticipated.  The above assessment and management plan was discussed with the patient. The patient verbalized understanding of and has agreed to the management plan.   Leita Longs, FNP

## 2024-06-07 ENCOUNTER — Ambulatory Visit (INDEPENDENT_AMBULATORY_CARE_PROVIDER_SITE_OTHER): Payer: Medicare Other

## 2024-06-07 VITALS — Ht 65.0 in | Wt >= 6400 oz

## 2024-06-07 DIAGNOSIS — Z1231 Encounter for screening mammogram for malignant neoplasm of breast: Secondary | ICD-10-CM

## 2024-06-07 DIAGNOSIS — Z0000 Encounter for general adult medical examination without abnormal findings: Secondary | ICD-10-CM

## 2024-06-07 DIAGNOSIS — Z124 Encounter for screening for malignant neoplasm of cervix: Secondary | ICD-10-CM

## 2024-06-07 NOTE — Patient Instructions (Addendum)
 Jasmin Castro,   Our next conversation for your Medicare Wellness Visit will be on June 12, 2025 at 10:40 am Video visit.   Your next visit with Dr. Tobie is on November 03, 2024 at 11:20 am in office. At this visit, please remove your socks and shoes when you are put into the exam room as a gentle reminder to the staff that you are due for your diabetic foot exam   Thank you for taking the time for your Medicare Wellness Visit. I appreciate your continued commitment to your health goals. Please review the care plan we discussed, and feel free to reach out if I can assist you further.  Medicare recommends these wellness visits once per year to help you and your care team stay ahead of potential health issues. These visits are designed to focus on prevention, allowing your provider to concentrate on managing your acute and chronic conditions during your regular appointments.  Please note that Annual Wellness Visits do not include a physical exam. Some assessments may be limited, especially if the visit was conducted virtually. If needed, we may recommend a separate in-person follow-up with your provider.  Ongoing Care Seeing your primary care provider every 3 to 6 months helps us  monitor your health and provide consistent, personalized care.   Referrals If a referral was made during today's visit and you haven't received any updates within two weeks, please contact the referred provider directly to check on the status.  Call to schedule your mammogram and/or osteoporosis screening: [facility]- [phone]   Cervical Cancer Screening/Pap Smear [facility] Address: [address], [place], [place] [address] Phone: [phone]  Recommended Screenings:  Health Maintenance  Topic Date Due   Eye exam for diabetics  Never done   DTaP/Tdap/Td vaccine (1 - Tdap) Never done   Pneumococcal Vaccine for age over 23 (1 of 2 - PCV) Never done   Hepatitis B Vaccine (1 of 3 - 19+  3-dose series) Never done   Pap with HPV screening  07/19/2016   Breast Cancer Screening  11/11/2019   Complete foot exam   07/15/2023   Zoster (Shingles) Vaccine (1 of 2) Never done   Flu Shot  04/14/2024   COVID-19 Vaccine (1 - 2024-25 season) Never done   Hemoglobin A1C  07/23/2024   Cologuard (Stool DNA test)  11/05/2024   Yearly kidney function blood test for diabetes  01/20/2025   Yearly kidney health urinalysis for diabetes  01/20/2025   Medicare Annual Wellness Visit  06/07/2025   Hepatitis C Screening  Completed   HIV Screening  Completed   HPV Vaccine  Aged Out   Meningitis B Vaccine  Aged Out       06/07/2024   11:51 AM  Advanced Directives  Does Patient Have a Medical Advance Directive? No  Would patient like information on creating a medical advance directive? No - Patient declined   Advance Care Planning is important because it: Ensures you receive medical care that aligns with your values, goals, and preferences. Provides guidance to your family and loved ones, reducing the emotional burden of decision-making during critical moments.  Vision: Annual vision screenings are recommended for early detection of glaucoma, cataracts, and diabetic retinopathy. These exams can also reveal signs of chronic conditions such as diabetes and high blood pressure.  Dental: Annual dental screenings help detect early signs of oral cancer, gum disease, and other conditions linked to overall health, including heart disease and diabetes.  Please  see the attached documents for additional preventive care recommendations.

## 2024-06-07 NOTE — Progress Notes (Signed)
 Subjective:   Jasmin Castro is a 48 y.o. who presents for a Medicare Wellness preventive visit.  As a reminder, Annual Wellness Visits don't include a physical exam, and some assessments may be limited, especially if this visit is performed virtually. We may recommend an in-person follow-up visit with your provider if needed.  Visit Complete: Virtual I connected with  Jasmin Castro on 06/07/24 by a video and audio enabled telemedicine application and verified that I am speaking with the correct person using two identifiers.  Patient Location: Home  Provider Location: Home Office  I discussed the limitations of evaluation and management by telemedicine. The patient expressed understanding and agreed to proceed.  Vital Signs: Because this visit was a virtual/telehealth visit, some criteria may be missing or patient reported. Any vitals not documented were not able to be obtained and vitals that have been documented are patient reported.  Persons Participating in Visit: Patient.  AWV Questionnaire: Yes: Patient Medicare AWV questionnaire was completed by the patient on 06/06/2024; I have confirmed that all information answered by patient is correct and no changes since this date.  Cardiac Risk Factors include: diabetes mellitus;dyslipidemia;hypertension;obesity (BMI >30kg/m2);sedentary lifestyle;Other (see comment), Risk factor comments: OSA     Objective:    Today's Vitals   06/06/24 0831 06/07/24 1153  Weight:  (!) 450 lb (204.1 kg)  Height:  5' 5 (1.651 m)  PainSc: 7  7   PainLoc:  Knee   Body mass index is 74.88 kg/m.     06/07/2024   11:51 AM 06/07/2023    9:07 AM 06/01/2022    9:14 AM 05/28/2021    9:18 AM 09/16/2018   10:26 AM 12/21/2014    9:45 AM  Advanced Directives  Does Patient Have a Medical Advance Directive? No No No No No  No   Would patient like information on creating a medical advance directive? No - Patient declined No - Patient declined No - Patient  declined No - Patient declined No - Patient declined  No - patient declined information      Data saved with a previous flowsheet row definition    Current Medications (verified) Outpatient Encounter Medications as of 06/07/2024  Medication Sig   acetaminophen  (TYLENOL ) 325 MG tablet Take 650 mg by mouth every 6 (six) hours as needed.   albuterol  (VENTOLIN  HFA) 108 (90 Base) MCG/ACT inhaler Inhale 1-2 puffs into the lungs every 6 (six) hours as needed for wheezing or shortness of breath.   Alirocumab  (PRALUENT ) 150 MG/ML SOAJ Inject 1 mL (150 mg total) as directed every 14 (fourteen) days.   amLODipine  (NORVASC ) 5 MG tablet Take 1 tablet (5 mg total) by mouth daily.   amoxicillin -clavulanate (AUGMENTIN ) 875-125 MG tablet Take 1 tablet by mouth 2 (two) times daily.   benzonatate  (TESSALON ) 200 MG capsule Take 1 capsule (200 mg total) by mouth 2 (two) times daily as needed for cough.   Cetirizine  HCl (ZYRTEC  ALLERGY ) 10 MG TBDP Take 10 mg by mouth at bedtime.   clotrimazole -betamethasone  (LOTRISONE ) cream Apply topically 2 (two) times daily.   cyclobenzaprine  (FLEXERIL ) 5 MG tablet Take 1 tablet (5 mg total) by mouth 2 (two) times daily as needed.   Dulaglutide  (TRULICITY ) 3 MG/0.5ML SOAJ Inject 3 mg as directed once a week.   Dulaglutide  (TRULICITY ) 3 MG/0.5ML SOAJ Inject 3 mg into the skin once a week.   enalapril  (VASOTEC ) 20 MG tablet Take 1 tablet (20 mg total) by mouth daily.   fluticasone  (FLONASE )  50 MCG/ACT nasal spray Place 2 sprays into both nostrils daily.   ibuprofen  (ADVIL ) 600 MG tablet Take 1 tablet (600 mg total) by mouth 2 (two) times daily as needed.   LORazepam (ATIVAN) 1 MG tablet Take 1 mg by mouth at bedtime.   meclizine  (ANTIVERT ) 12.5 MG tablet TAKE ONE TABLET BY MOUTH THREE TIMES DAILY AS NEEDED FOR DIZZINESS OR NAUSEA   mupirocin  ointment (BACTROBAN ) 2 % Apply topically daily.   nystatin  cream (MYCOSTATIN ) APPLY ONE APPLICATION TOPICALLY TWICE DAILY   OLANZapine  (ZYPREXA) 15 MG tablet TAKE ONE TABLET BY MOUTH EVERY EVENING   Omega-3 Fatty Acids (FISH OIL) 1000 MG CAPS Take 1 capsule by mouth daily.   ondansetron  (ZOFRAN ) 4 MG tablet Take 1 tablet (4 mg total) by mouth every 8 (eight) hours as needed for nausea or vomiting.   PRALUENT  150 MG/ML SOAJ ADMINISTER 1 ML(150 MG) UNDER THE SKIN EVERY 14 DAYS   PRISTIQ 100 MG 24 hr tablet Take 100 mg by mouth at bedtime.   Vitamin D , Ergocalciferol , (DRISDOL ) 1.25 MG (50000 UNIT) CAPS capsule TAKE 1 CAPSULE BY MOUTH EVERY 7 DAYS   No facility-administered encounter medications on file as of 06/07/2024.    Allergies (verified) Statins, Crestor  [rosuvastatin ], Keflex  [cephalexin ], Lodine [etodolac], and Seroquel [quetiapine fumarate]   History: Past Medical History:  Diagnosis Date   Allergy     Anemia    Arthritis    Asthma    Depression    Diabetes mellitus without complication (HCC) 2021   GERD (gastroesophageal reflux disease)    Hyperlipemia    Hypertension    Obesity    Panic attacks    Paranoid (HCC)    PCOS (polycystic ovarian syndrome)    Prediabetes    Sleep apnea    Urge incontinence 07/19/2013   Past Surgical History:  Procedure Laterality Date   HYSTEROSCOPY DIAGNOSTIC     insulin resistant     Family History  Problem Relation Age of Onset   Hyperlipidemia Mother    Arthritis Mother    Diabetes Mother    Heart disease Mother    Stroke Mother    Hyperlipidemia Father    Cancer Father        colon and prostate , brain tumor   Diabetes Sister        prediatic   Arthritis Sister    Hyperlipidemia Sister    Obesity Sister    Hyperlipidemia Brother    Hypertension Brother    Anxiety disorder Brother    Arthritis Brother    Diabetes Brother    Obesity Brother    Diabetes Sister    Social History   Socioeconomic History   Marital status: Single    Spouse name: Not on file   Number of children: Not on file   Years of education: Not on file   Highest education level:  12th grade  Occupational History   Not on file  Tobacco Use   Smoking status: Never   Smokeless tobacco: Never   Tobacco comments:    Never smoked  Substance and Sexual Activity   Alcohol use: No   Drug use: No   Sexual activity: Never    Birth control/protection: None  Other Topics Concern   Not on file  Social History Narrative   Lives alone but pt states she sees family daily.   Social Drivers of Health   Financial Resource Strain: Patient Declined (06/07/2024)   Overall Financial Resource Strain (CARDIA)  Difficulty of Paying Living Expenses: Patient declined  Food Insecurity: Patient Declined (06/07/2024)   Hunger Vital Sign    Worried About Running Out of Food in the Last Year: Patient declined    Ran Out of Food in the Last Year: Patient declined  Transportation Needs: Patient Declined (06/07/2024)   PRAPARE - Administrator, Civil Service (Medical): Patient declined    Lack of Transportation (Non-Medical): Patient declined  Physical Activity: Inactive (06/07/2024)   Exercise Vital Sign    Days of Exercise per Week: 0 days    Minutes of Exercise per Session: 0 min  Stress: No Stress Concern Present (06/07/2024)   [employer] of Occupational Health - Occupational Stress Questionnaire    Feeling of Stress: Not at all  Social Connections: Socially Isolated (06/07/2024)   Social Connection and Isolation Panel    Frequency of Communication with Friends and Family: Once a week    Frequency of Social Gatherings with Friends and Family: Once a week    Attends Religious Services: Never    Database administrator or Organizations: No    Attends Engineer, structural: Never    Marital Status: Never married    Tobacco Counseling Counseling given: Yes Tobacco comments: Never smoked    Clinical Intake:  Pre-visit preparation completed: Yes  Pain : 0-10 Pain Score: 7  Pain Type: Chronic pain Pain Location: Knee Pain Orientation: Left,  Right Pain Descriptors / Indicators: Constant Pain Onset: More than a month ago Pain Frequency: Constant     BMI - recorded: 74.88 Nutritional Status: BMI > 30  Obese Nutritional Risks: None Diabetes: Yes CBG done?: No (TELEHEALTH VISIT.) Did pt. bring in CBG monitor from home?: No  Lab Results  Component Value Date   HGBA1C 5.7 (H) 01/21/2024   HGBA1C 6.1 (H) 05/13/2023   HGBA1C 6.2 (H) 07/14/2022     How often do you need to have someone help you when you read instructions, pamphlets, or other written materials from your doctor or pharmacy?: 1 - Never  Interpreter Needed?: No  Information entered by :: Mohammedali Bedoy W CMA (AAMA)   Activities of Daily Living     06/06/2024    8:31 AM  In your present state of health, do you have any difficulty performing the following activities:  Hearing? 0  Vision? 0  Difficulty concentrating or making decisions? 1  Walking or climbing stairs? 1  Dressing or bathing? 0  Doing errands, shopping? 1  Preparing Food and eating ? Y  Using the Toilet? N  In the past six months, have you accidently leaked urine? Y  Do you have problems with loss of bowel control? N  Managing your Medications? N  Managing your Finances? N  Housekeeping or managing your Housekeeping? N    Patient Care Team: Tobie Suzzane POUR, MD as PCP - General (Internal Medicine) Blinda Katz, DPM as Consulting Physician (Podiatry) Ladora, Ross Lacy Cohn, MD as Referring Physician (Optometry)  I have updated your Care Teams any recent Medical Services you may have received from other providers in the past year.     Assessment:   This is a routine wellness examination for Konni.  Hearing/Vision screen Hearing Screening - Comments:: Patient denies any hearing difficulties.   Vision Screening - Comments:: Wears rx glasses - up to date with routine eye exams with  [facility]//// DR. LADORA   Goals Addressed  This Visit's Progress     LOSE  WEIGHT AND IMPROVE HEALTH (pt-stated)          Depression Screen     06/07/2024   11:57 AM 01/28/2024    2:11 PM 12/20/2023    1:10 PM 06/07/2023    9:12 AM 02/23/2023    1:38 PM 07/14/2022    1:23 PM 06/10/2022   11:24 AM  PHQ 2/9 Scores  PHQ - 2 Score 0 0 0 0 0 0 0  PHQ- 9 Score 0 0 0 0  0 0     Fall Risk     06/07/2024   11:58 AM 06/06/2024    8:31 AM 01/28/2024    2:11 PM 12/20/2023    1:10 PM 06/03/2023    4:55 PM  Fall Risk   Falls in the past year? 0 0 0 0 0  Number falls in past yr: 0 0 0 0 0  Injury with Fall? 0 0 0 0 0  Risk for fall due to : Impaired balance/gait;Impaired mobility;Orthopedic patient  No Fall Risks No Fall Risks Impaired balance/gait;Impaired mobility;Orthopedic patient  Follow up Falls prevention discussed;Education provided;Falls evaluation completed  Falls evaluation completed Falls evaluation completed Falls prevention discussed;Education provided    MEDICARE RISK AT HOME:  Medicare Risk at Home Any stairs in or around the home?: (Patient-Rptd) Yes If so, are there any without handrails?: (Patient-Rptd) No Home free of loose throw rugs in walkways, pet beds, electrical cords, etc?: (Patient-Rptd) No Adequate lighting in your home to reduce risk of falls?: (Patient-Rptd) Yes Life alert?: (Patient-Rptd) No Use of a cane, walker or w/c?: (Patient-Rptd) Yes Grab bars in the bathroom?: (Patient-Rptd) Yes Shower chair or bench in shower?: (Patient-Rptd) Yes Elevated toilet seat or a handicapped toilet?: (Patient-Rptd) Yes  TIMED UP AND GO:  Was the test performed?  No  Cognitive Function: 6CIT completed        06/07/2024   12:00 PM 06/07/2023    9:11 AM 06/01/2022    9:15 AM 05/28/2021    9:21 AM  6CIT Screen  What Year? 0 points 0 points 0 points 0 points  What month? 0 points 0 points 0 points 0 points  What time? 0 points 0 points 0 points 0 points  Count back from 20 0 points 0 points 0 points 0 points  Months in reverse 0 points 0 points  0 points 0 points  Repeat phrase 0 points 0 points 0 points 2 points  Total Score 0 points 0 points 0 points 2 points    Immunizations Immunization History  Administered Date(s) Administered   Influenza Split 07/18/2012   Influenza,inj,Quad PF,6+ Mos 10/13/2013, 09/12/2014, 05/21/2015    Screening Tests Health Maintenance  Topic Date Due   OPHTHALMOLOGY EXAM  Never done   DTaP/Tdap/Td (1 - Tdap) Never done   Pneumococcal Vaccine: 50+ Years (1 of 2 - PCV) Never done   Hepatitis B Vaccines 19-59 Average Risk (1 of 3 - 19+ 3-dose series) Never done   Cervical Cancer Screening (HPV/Pap Cotest)  07/19/2016   Mammogram  11/11/2019   FOOT EXAM  07/15/2023   Zoster Vaccines- Shingrix (1 of 2) Never done   Influenza Vaccine  04/14/2024   COVID-19 Vaccine (1 - 2024-25 season) Never done   HEMOGLOBIN A1C  07/23/2024   Fecal DNA (Cologuard)  11/05/2024   Diabetic kidney evaluation - eGFR measurement  01/20/2025   Diabetic kidney evaluation - Urine ACR  01/20/2025   Medicare Annual Wellness (  AWV)  06/07/2025   Hepatitis C Screening  Completed   HIV Screening  Completed   HPV VACCINES  Aged Out   Meningococcal B Vaccine  Aged Out    Health Maintenance Health Maintenance Due  Topic Date Due   OPHTHALMOLOGY EXAM  Never done   DTaP/Tdap/Td (1 - Tdap) Never done   Pneumococcal Vaccine: 50+ Years (1 of 2 - PCV) Never done   Hepatitis B Vaccines 19-59 Average Risk (1 of 3 - 19+ 3-dose series) Never done   Cervical Cancer Screening (HPV/Pap Cotest)  07/19/2016   Mammogram  11/11/2019   FOOT EXAM  07/15/2023   Zoster Vaccines- Shingrix (1 of 2) Never done   Influenza Vaccine  04/14/2024   COVID-19 Vaccine (1 - 2024-25 season) Never done   Health Maintenance Items Addressed: Mammogram ordered Ob/GYN referral placed for Cervical CA Screening  Additional Screening:  Vision Screening: Recommended annual ophthalmology exams for early detection of glaucoma and other disorders of the  eye. Would you like a referral to an eye doctor? No    Dental Screening: Recommended annual dental exams for proper oral hygiene  Community Resource Referral / Chronic Care Management: CRR required this visit?  No   CCM required this visit?  No   Plan:    I have personally reviewed and noted the following in the patient's chart:   Medical and social history Use of alcohol, tobacco or illicit drugs  Current medications and supplements including opioid prescriptions. Patient is not currently taking opioid prescriptions. Functional ability and status Nutritional status Physical activity Advanced directives List of other physicians Hospitalizations, surgeries, and ER visits in previous 12 months Vitals Screenings to include cognitive, depression, and falls Referrals and appointments  In addition, I have reviewed and discussed with patient certain preventive protocols, quality metrics, and best practice recommendations. A written personalized care plan for preventive services as well as general preventive health recommendations were provided to patient.   Juluis Fitzsimmons, CMA   06/07/2024   After Visit Summary: (MyChart) Due to this being a telephonic visit, the after visit summary with patients personalized plan was offered to patient via MyChart   Notes: Nothing significant to report at this time.

## 2024-06-09 ENCOUNTER — Other Ambulatory Visit: Payer: Self-pay | Admitting: Internal Medicine

## 2024-06-09 DIAGNOSIS — L0882 Omphalitis not of newborn: Secondary | ICD-10-CM

## 2024-06-09 DIAGNOSIS — E559 Vitamin D deficiency, unspecified: Secondary | ICD-10-CM

## 2024-06-13 ENCOUNTER — Ambulatory Visit: Admitting: Internal Medicine

## 2024-06-15 ENCOUNTER — Other Ambulatory Visit: Payer: Self-pay | Admitting: Internal Medicine

## 2024-06-15 DIAGNOSIS — I10 Essential (primary) hypertension: Secondary | ICD-10-CM

## 2024-06-16 ENCOUNTER — Other Ambulatory Visit: Payer: Self-pay | Admitting: Internal Medicine

## 2024-06-16 DIAGNOSIS — I10 Essential (primary) hypertension: Secondary | ICD-10-CM

## 2024-06-28 ENCOUNTER — Other Ambulatory Visit: Payer: Self-pay

## 2024-06-29 ENCOUNTER — Other Ambulatory Visit (HOSPITAL_COMMUNITY): Payer: Self-pay

## 2024-07-22 ENCOUNTER — Other Ambulatory Visit (HOSPITAL_COMMUNITY): Payer: Self-pay

## 2024-07-24 ENCOUNTER — Other Ambulatory Visit: Payer: Self-pay

## 2024-07-25 ENCOUNTER — Other Ambulatory Visit (HOSPITAL_BASED_OUTPATIENT_CLINIC_OR_DEPARTMENT_OTHER): Payer: Self-pay

## 2024-07-28 ENCOUNTER — Other Ambulatory Visit: Payer: Self-pay | Admitting: Internal Medicine

## 2024-07-28 DIAGNOSIS — I10 Essential (primary) hypertension: Secondary | ICD-10-CM

## 2024-08-01 ENCOUNTER — Other Ambulatory Visit: Payer: Self-pay | Admitting: Internal Medicine

## 2024-08-07 ENCOUNTER — Encounter: Admitting: Obstetrics & Gynecology

## 2024-08-29 ENCOUNTER — Other Ambulatory Visit (HOSPITAL_COMMUNITY): Payer: Self-pay

## 2024-08-29 ENCOUNTER — Other Ambulatory Visit: Payer: Self-pay | Admitting: Internal Medicine

## 2024-08-29 ENCOUNTER — Other Ambulatory Visit: Payer: Self-pay

## 2024-08-29 ENCOUNTER — Other Ambulatory Visit (HOSPITAL_BASED_OUTPATIENT_CLINIC_OR_DEPARTMENT_OTHER): Payer: Self-pay

## 2024-08-29 MED ORDER — TRULICITY 3 MG/0.5ML ~~LOC~~ SOAJ
3.0000 mg | SUBCUTANEOUS | 5 refills | Status: AC
  Filled 2024-08-29 (×2): qty 2, 28d supply, fill #0
  Filled 2024-09-22: qty 2, 28d supply, fill #1
  Filled 2024-10-20: qty 2, 28d supply, fill #2

## 2024-09-11 ENCOUNTER — Other Ambulatory Visit: Payer: Self-pay | Admitting: Internal Medicine

## 2024-09-22 ENCOUNTER — Other Ambulatory Visit (HOSPITAL_COMMUNITY): Payer: Self-pay

## 2024-10-20 ENCOUNTER — Other Ambulatory Visit: Payer: Self-pay

## 2024-11-03 ENCOUNTER — Ambulatory Visit: Admitting: Internal Medicine

## 2025-06-12 ENCOUNTER — Ambulatory Visit
